# Patient Record
Sex: Male | Born: 1995 | Hispanic: No | Marital: Single | Smoking: Current every day smoker
Health system: Southern US, Community
[De-identification: ages and names within clinical notes are randomized; demographics above are authoritative.]

## PROBLEM LIST (undated history)

## (undated) DIAGNOSIS — R51 Headache: Secondary | ICD-10-CM

## (undated) DIAGNOSIS — F1410 Cocaine abuse, uncomplicated: Secondary | ICD-10-CM

## (undated) DIAGNOSIS — F1910 Other psychoactive substance abuse, uncomplicated: Secondary | ICD-10-CM

## (undated) DIAGNOSIS — K219 Gastro-esophageal reflux disease without esophagitis: Secondary | ICD-10-CM

## (undated) DIAGNOSIS — F410 Panic disorder [episodic paroxysmal anxiety] without agoraphobia: Secondary | ICD-10-CM

## (undated) DIAGNOSIS — J45909 Unspecified asthma, uncomplicated: Secondary | ICD-10-CM

## (undated) DIAGNOSIS — R519 Headache, unspecified: Secondary | ICD-10-CM

## (undated) DIAGNOSIS — H1010 Acute atopic conjunctivitis, unspecified eye: Secondary | ICD-10-CM

## (undated) DIAGNOSIS — Z9109 Other allergy status, other than to drugs and biological substances: Secondary | ICD-10-CM

## (undated) DIAGNOSIS — R569 Unspecified convulsions: Secondary | ICD-10-CM

## (undated) DIAGNOSIS — F32A Depression, unspecified: Secondary | ICD-10-CM

## (undated) DIAGNOSIS — F419 Anxiety disorder, unspecified: Secondary | ICD-10-CM

## (undated) HISTORY — DX: Unspecified convulsions: R56.9

## (undated) HISTORY — DX: Gastro-esophageal reflux disease without esophagitis: K21.9

## (undated) HISTORY — DX: Anxiety disorder, unspecified: F41.9

## (undated) HISTORY — DX: Cocaine abuse, uncomplicated: F14.10

## (undated) HISTORY — DX: Other psychoactive substance abuse, uncomplicated: F19.10

## (undated) HISTORY — DX: Panic disorder (episodic paroxysmal anxiety): F41.0

## (undated) HISTORY — PX: ESOPHAGOGASTRODUODENOSCOPY: SHX1529

## (undated) HISTORY — DX: Other allergy status, other than to drugs and biological substances: Z91.09

## (undated) HISTORY — DX: Headache: R51

## (undated) HISTORY — DX: Headache, unspecified: R51.9

## (undated) HISTORY — PX: OTHER SURGICAL HISTORY: SHX169

## (undated) HISTORY — DX: Acute atopic conjunctivitis, unspecified eye: H10.10

## (undated) HISTORY — PX: UPPER GI ENDOSCOPY: SHX6162

## (undated) HISTORY — PX: TONSILLECTOMY: SUR1361

## (undated) HISTORY — DX: Unspecified asthma, uncomplicated: J45.909

## (undated) HISTORY — DX: Depression, unspecified: F32.A

## (undated) HISTORY — PX: NM ESOPHAGEAL REFLUX: HXRAD613

---
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
## 2016-01-20 ENCOUNTER — Ambulatory Visit: Payer: Managed Care, Other (non HMO) | Admitting: Physician Assistant

## 2016-02-02 ENCOUNTER — Encounter: Payer: Self-pay | Admitting: Medical

## 2016-02-02 ENCOUNTER — Ambulatory Visit (INDEPENDENT_AMBULATORY_CARE_PROVIDER_SITE_OTHER): Payer: Managed Care, Other (non HMO) | Admitting: Medical

## 2016-02-02 VITALS — BP 118/78 | HR 96 | Temp 98.1°F | Ht 70.75 in | Wt 226.8 lb

## 2016-02-02 DIAGNOSIS — R062 Wheezing: Secondary | ICD-10-CM

## 2016-02-02 DIAGNOSIS — J309 Allergic rhinitis, unspecified: Secondary | ICD-10-CM

## 2016-02-02 DIAGNOSIS — J0100 Acute maxillary sinusitis, unspecified: Secondary | ICD-10-CM | POA: Diagnosis not present

## 2016-02-02 MED ORDER — LEVOCETIRIZINE DIHYDROCHLORIDE 5 MG PO TABS: 5.0000 mg | ORAL_TABLET | Freq: Every evening | ORAL | Status: DC

## 2016-02-02 MED ORDER — MONTELUKAST SODIUM 10 MG PO TABS: 10.0000 mg | ORAL_TABLET | Freq: Every day | ORAL | Status: DC

## 2016-02-02 MED ORDER — OMEPRAZOLE 40 MG PO CPDR: 40.0000 mg | DELAYED_RELEASE_CAPSULE | Freq: Every day | ORAL | Status: DC

## 2016-02-02 MED ORDER — ALBUTEROL SULFATE HFA 108 (90 BASE) MCG/ACT IN AERS: 2.0000 | INHALATION_SPRAY | Freq: Four times a day (QID) | RESPIRATORY_TRACT | Status: DC | PRN

## 2016-02-02 MED ORDER — FLUTICASONE PROPIONATE 50 MCG/ACT NA SUSP: 2.0000 | Freq: Every day | NASAL | Status: DC

## 2016-02-02 MED ORDER — AZITHROMYCIN 250 MG PO TABS: ORAL_TABLET | ORAL | Status: DC

## 2016-02-02 NOTE — Progress Notes (Signed)
Pre visit review using our clinic review tool, if applicable. No additional management support is needed unless otherwise documented below in the visit note. 

## 2016-02-02 NOTE — Progress Notes (Signed)
Subjective:    Patient ID: Andrew Castillo, male    DOB: 10-31-1995, 20 y.o.   MRN: [id number]  HPI  Pt in for evaluation of chest congestion, cough, nasal congestion and sinus pressure. Symptoms for about 1 wk. Sneezing and itchy eyes. Mom thinks he has year round allergies. Should be on flonase but does not use. States makes his nose bleed.(But minimizes severity after discussing)   Some sweating last night and maybe fever last night.  Pt mom states when he was younger had to used inhalers     Review of Systems  Constitutional: Positive for fever and diaphoresis. Negative for chills and fatigue.  HENT: Positive for congestion, postnasal drip, sinus pressure and sneezing. Negative for sore throat.   Respiratory: Positive for cough and wheezing. Negative for chest tightness.        Some wheezing at night  Past 2 nights.  Cardiovascular: Negative for chest pain and palpitations.  Gastrointestinal: Negative for abdominal pain.  Musculoskeletal: Negative for back pain.  Neurological: Negative for dizziness and headaches.  Hematological: Negative for adenopathy. Does not bruise/bleed easily.  Psychiatric/Behavioral: Negative for behavioral problems and confusion.    Past Medical History  Diagnosis Date  . GERD (gastroesophageal reflux disease)   . Seasonal allergic conjunctivitis   . Environmental allergies   . Childhood asthma      Social History   Social History  . Marital Status: Single    Spouse Name: N/A  . Number of Children: 0  . Years of Education: N/A   Occupational History  . Student     [employer]   Social History Main Topics  . Smoking status: Never Smoker   . Smokeless tobacco: Never Used  . Alcohol Use: No  . Drug Use: No  . Sexual Activity: Not on file   Other Topics Concern  . Not on file   Social History Narrative    Past Surgical History  Procedure Laterality Date  . Left knee menuiscus    . Right shoulder surgery    . Tonsillectomy       Family History  Problem Relation Age of Onset  . Hypertension Father   . Hyperlipidemia Father     Living  . Fibromyalgia Mother   . Thyroid disease Mother     Living  . Lupus Mother   . Anxiety disorder Mother   . Clotting disorder Mother   . Bone cancer Paternal Grandmother   . Heart disease Paternal Grandfather   . Diabetes Maternal Grandmother   . Diabetes Paternal Grandfather   . Hypertension Maternal Grandmother   . Liver disease Maternal Grandmother   . Anxiety disorder Maternal Aunt   . Depression Maternal Aunt   . Lupus Maternal Aunt   . Diabetes Paternal Aunt   . Lupus Paternal Aunt   . Fibromyalgia Paternal Aunt   . Drug abuse Paternal Uncle   . ADD / ADHD Brother   . Anxiety disorder Sister   . Migraines Other     Various  . Seizures Brother     No Known Allergies  Current Outpatient Prescriptions on File Prior to Visit  Medication Sig Dispense Refill  . escitalopram (LEXAPRO) 10 MG tablet Take 1 tablet (10 mg total) by mouth daily. 30 tablet 1   No current facility-administered medications on file prior to visit.    BP 118/78 mmHg  Pulse 96  Temp(Src) 98.1 F (36.7 C) (Oral)  Ht 5' 10.75" (1.797 m)  Wt 226 lb  12.8 oz (102.876 kg)  BMI 31.86 kg/m2  SpO2 98%       Objective:   Physical Exam  General  Mental Status - Alert. General Appearance - Well groomed. Not in acute distress.  Skin Rashes- No Rashes.  HEENT Head- Normal. Ear Auditory Canal - Left- Normal. Right - Normal.Tympanic Membrane- Left- Normal. Right- Normal. Eye Sclera/Conjunctiva- Left- Normal. Right- Normal. Nose & Sinuses Nasal Mucosa- Left-  Boggy and Congested. Right-  Boggy and Congested.Bilateral faint  maxillary but no  frontal sinus pressure. Mouth & Throat Lips: Upper Lip- Normal: no dryness, cracking, pallor, cyanosis, or vesicular eruption. Lower Lip-Normal: no dryness, cracking, pallor, cyanosis or vesicular eruption. Buccal Mucosa- Bilateral- No Aphthous  ulcers. Oropharynx- No Discharge or Erythema. +pnd. Tonsils: Characteristics- Bilateral- No Erythema or Congestion. Size/Enlargement- Bilateral- No enlargement. Discharge- bilateral-None.  Neck Neck- Supple. No Masses. No rigidiy.   Chest and Lung Exam Auscultation: Breath Sounds:-Clear even and unlabored.  Cardiovascular Auscultation:Rythm- Regular, rate and rhythm. Murmurs & Other Heart Sounds:Ausculatation of the heart reveal- No Murmurs.  Lymphatic Head & Neck General Head & Neck Lymphatics: Bilateral: Description- No Localized lymphadenopathy.  Abdomen Inspection:-Inspeection Normal. Palpation/Percussion:Note:No mass. Palpation and Percussion of the abdomen reveal- Non Tender, Non Distended + BS, no rebound or guarding.    Neurologic Cranial Nerve exam:- CN III-XII intact(No nystagmus), symmetric smile. Finger to Nose:- Normal/Intact Strength:- 5/5 equal and symmetric strength both upper and lower extremities.       Assessment & Plan:  For your allergies rx xyzal and flonase(reports  that he will take) and will add montelukast(due to chronic year round allergies.)  For sinusitis and bronchitis will rx azithromycin antibiotic.  For wheezing rx albuterol(advised if using up to every 6 hours/frequently notify us)  Follow up in 7 days or as needed  Dylann Gallier, Ramon Dredge, [employer]

## 2016-02-02 NOTE — Patient Instructions (Addendum)
For your allergies rx xyzal and flonase(reports that he will take) and will add montelukast (due to chronic year round allergies.)  For sinusitis and bronchitis will rx azithromycin antibiotic.  For wheezing rx albuterol(advised if using up to every 6 hours/frequently notify us)  Follow up in 7 days or as needed

## 2016-04-01 ENCOUNTER — Emergency Department (HOSPITAL_COMMUNITY)
Admission: EM | Admit: 2016-04-01 | Discharge: 2016-04-02 | Disposition: A | Discharge status: Home / Self Care | Payer: Managed Care, Other (non HMO) | Attending: Emergency Medicine | Admitting: Emergency Medicine

## 2016-04-01 ENCOUNTER — Emergency Department (HOSPITAL_COMMUNITY): Payer: Managed Care, Other (non HMO)

## 2016-04-01 ENCOUNTER — Encounter (HOSPITAL_COMMUNITY): Payer: Self-pay | Admitting: Emergency Medicine

## 2016-04-01 DIAGNOSIS — R569 Unspecified convulsions: Secondary | ICD-10-CM | POA: Diagnosis present

## 2016-04-01 DIAGNOSIS — Z79899 Other long term (current) drug therapy: Secondary | ICD-10-CM | POA: Diagnosis not present

## 2016-04-01 DIAGNOSIS — F17210 Nicotine dependence, cigarettes, uncomplicated: Secondary | ICD-10-CM | POA: Diagnosis not present

## 2016-04-01 LAB — URINE MICROSCOPIC-ADD ON

## 2016-04-01 LAB — URINALYSIS, ROUTINE W REFLEX MICROSCOPIC
Bilirubin Urine: NEGATIVE
GLUCOSE, UA: NEGATIVE mg/dL
Ketones, ur: 15 mg/dL — AB
Leukocytes, UA: NEGATIVE
Nitrite: NEGATIVE
PH: 5 (ref 5.0–8.0)
Protein, ur: 100 mg/dL — AB
SPECIFIC GRAVITY, URINE: 1.027 (ref 1.005–1.030)

## 2016-04-01 LAB — CBC
HEMATOCRIT: 44.2 % (ref 39.0–52.0)
HEMOGLOBIN: 14.9 g/dL (ref 13.0–17.0)
MCH: 28.9 pg (ref 26.0–34.0)
MCHC: 33.7 g/dL (ref 30.0–36.0)
MCV: 85.8 fL (ref 78.0–100.0)
PLATELETS: 247 10*3/uL (ref 150–400)
RBC: 5.15 MIL/uL (ref 4.22–5.81)
RDW: 13.2 % (ref 11.5–15.5)
WBC: 12.3 10*3/uL — ABNORMAL HIGH (ref 4.0–10.5)

## 2016-04-01 LAB — BASIC METABOLIC PANEL
Anion gap: 14 (ref 5–15)
BUN: 11 mg/dL (ref 6–20)
CHLORIDE: 107 mmol/L (ref 101–111)
CO2: 17 mmol/L — AB (ref 22–32)
CREATININE: 1.17 mg/dL (ref 0.61–1.24)
Calcium: 9.4 mg/dL (ref 8.9–10.3)
GFR calc non Af Amer: >60 mL/min (ref >60)
Glucose, Bld: 103 mg/dL — ABNORMAL HIGH (ref 65–99)
POTASSIUM: 4.1 mmol/L (ref 3.5–5.1)
Sodium: 138 mmol/L (ref 135–145)

## 2016-04-01 LAB — RAPID URINE DRUG SCREEN, HOSP PERFORMED
AMPHETAMINES: NOT DETECTED
BARBITURATES: NOT DETECTED
BENZODIAZEPINES: POSITIVE — AB
Cocaine: NOT DETECTED
Opiates: NOT DETECTED
TETRAHYDROCANNABINOL: POSITIVE — AB

## 2016-04-01 LAB — CBG MONITORING, ED: GLUCOSE-CAPILLARY: 98 mg/dL (ref 65–99)

## 2016-04-01 MED ORDER — ACETAMINOPHEN 500 MG PO TABS
1000.0000 mg | ORAL_TABLET | Freq: Once | ORAL | Status: AC
  Administered 2016-04-01: 1000 mg via ORAL
  Filled 2016-04-01: qty 2

## 2016-04-01 MED ORDER — DIPHENHYDRAMINE HCL 25 MG PO CAPS
25.0000 mg | ORAL_CAPSULE | Freq: Once | ORAL | Status: AC
  Administered 2016-04-01: 25 mg via ORAL
  Filled 2016-04-01: qty 1

## 2016-04-01 MED ORDER — SODIUM CHLORIDE 0.9 % IV BOLUS (SEPSIS)
1000.0000 mL | Freq: Once | INTRAVENOUS | Status: AC
  Administered 2016-04-01: 1000 mL via INTRAVENOUS

## 2016-04-01 MED ORDER — METOCLOPRAMIDE HCL 5 MG/ML IJ SOLN
10.0000 mg | Freq: Once | INTRAMUSCULAR | Status: AC
  Administered 2016-04-01: 10 mg via INTRAVENOUS
  Filled 2016-04-01: qty 2

## 2016-04-01 NOTE — ED Provider Notes (Signed)
CSN: [id number]     Arrival date & time 04/01/16  1915 History   First MD Initiated Contact with Patient 04/01/16 1927     Chief Complaint  Patient presents with  . Seizures     (Consider location/radiation/quality/duration/timing/severity/associated sxs/prior Treatment) Patient is a 20 y.o. male presenting with seizures. The history is provided by the patient and a parent.  Seizures Seizure activity on arrival: no   Seizure type:  Grand mal Preceding symptoms comment:  No preceding symptoms Initial focality:  Unable to specify Episode characteristics: abnormal movements, generalized shaking, stiffening and unresponsiveness   Episode characteristics: no incontinence and no tongue biting   Postictal symptoms: confusion and somnolence   Return to baseline: yes   Severity:  Moderate Duration:  45 seconds Timing:  Once Number of seizures this episode:  One Progression:  Resolved Context: decreased sleep, drug use, family hx of seizures and stress   Context: not alcohol withdrawal, not fever and not previous head injury   Recent head injury:  No recent head injuries PTA treatment:  None History of seizures: no     Past Medical History  Diagnosis Date  . GERD (gastroesophageal reflux disease)   . Seasonal allergic conjunctivitis   . Environmental allergies   . Childhood asthma    Past Surgical History  Procedure Laterality Date  . Left knee menuiscus    . Right shoulder surgery    . Tonsillectomy     Family History  Problem Relation Age of Onset  . Hypertension Father   . Hyperlipidemia Father     Living  . Fibromyalgia Mother   . Thyroid disease Mother     Living  . Lupus Mother   . Anxiety disorder Mother   . Clotting disorder Mother   . Bone cancer Paternal Grandmother   . Heart disease Paternal Grandfather   . Diabetes Maternal Grandmother   . Diabetes Paternal Grandfather   . Hypertension Maternal Grandmother   . Liver disease Maternal Grandmother   .  Anxiety disorder Maternal Aunt   . Depression Maternal Aunt   . Lupus Maternal Aunt   . Diabetes Paternal Aunt   . Lupus Paternal Aunt   . Fibromyalgia Paternal Aunt   . Drug abuse Paternal Uncle   . ADD / ADHD Brother   . Anxiety disorder Sister   . Migraines Other     Various  . Seizures Brother    Social History  Substance Use Topics  . Smoking status: Current Every Day Smoker -- 0.50 packs/day    Types: Cigarettes  . Smokeless tobacco: Never Used  . Alcohol Use: 0.0 oz/week    0 Standard drinks or equivalent per week     Comment: socially    Review of Systems  Constitutional: Negative for fever, chills, diaphoresis and fatigue.  HENT: Negative for congestion.   Eyes: Negative for visual disturbance.  Respiratory: Negative for cough, chest tightness and shortness of breath.   Cardiovascular: Positive for chest pain (when he takes a deep breath).  Gastrointestinal: Positive for nausea. Negative for vomiting, abdominal pain, diarrhea and constipation.  Genitourinary: Negative for dysuria and flank pain.  Musculoskeletal: Negative for neck pain.  Skin: Negative for rash.  Neurological: Positive for seizures and headaches (rare, intermittent). Negative for weakness and numbness.  Psychiatric/Behavioral: Negative for confusion and agitation.      Allergies  Review of patient's allergies indicates no known allergies.  Home Medications   Prior to Admission medications   Medication Sig Start  Date End Date Taking? Authorizing Provider  albuterol (PROVENTIL HFA;VENTOLIN HFA) 108 (90 Base) MCG/ACT inhaler Inhale 2 puffs into the lungs every 6 (six) hours as needed for wheezing or shortness of breath. 02/02/16  Yes Edward Saguier, PA-C  loratadine (CLARITIN) 10 MG tablet Take 10 mg by mouth daily.   Yes Historical Provider, MD  montelukast (SINGULAIR) 10 MG tablet Take 1 tablet (10 mg total) by mouth at bedtime. 02/02/16  Yes Edward Saguier, PA-C  omeprazole (PRILOSEC) 40 MG  capsule Take 1 capsule (40 mg total) by mouth daily. Reported on 12/21/2015 02/02/16  Yes Waldon Merl, PA-C  azithromycin (ZITHROMAX) 250 MG tablet Take 2 tablets by mouth on day 1, followed by 1 tablet by mouth daily for 4 days. Patient not taking: Reported on 04/01/2016 02/02/16   Esperanza Richters, PA-C  escitalopram (LEXAPRO) 10 MG tablet Take 1 tablet (10 mg total) by mouth daily. Patient not taking: Reported on 04/01/2016 12/21/15   Waldon Merl, PA-C  fluticasone [facility]) 50 MCG/ACT nasal spray Place 2 sprays into both nostrils daily. Patient not taking: Reported on 04/01/2016 02/02/16   Esperanza Richters, PA-C  levocetirizine (XYZAL) 5 MG tablet Take 1 tablet (5 mg total) by mouth every evening. Patient not taking: Reported on 04/01/2016 02/02/16   Ramon Dredge Saguier, PA-C   BP 105/52 mmHg  Pulse 71  Temp(Src) 98 F (36.7 C) (Oral)  Resp 19  Ht  (1.854 m)  Wt 102.059 kg  BMI 29.69 kg/m2  SpO2 98% Physical Exam  Constitutional: He is oriented to person, place, and time. He appears well-developed and well-nourished. No distress.  HENT:  Head: Normocephalic and atraumatic.  Eyes: Conjunctivae are normal.  Cardiovascular: Normal rate and normal heart sounds.   No murmur heard. Pulmonary/Chest: Effort normal and breath sounds normal. No respiratory distress. He has no wheezes.  Chest pain reproducible with palpation of the left chest.  Abdominal: Soft. There is no tenderness.  Musculoskeletal: He exhibits no edema.  Neurological: He is alert and oriented to person, place, and time. No cranial nerve deficit.  Skin: Skin is warm. He is not diaphoretic.  Psychiatric: He has a normal mood and affect. His behavior is normal.  Nursing note and vitals reviewed.   ED Course  Procedures (including critical care time) Labs Review Labs Reviewed  BASIC METABOLIC PANEL - Abnormal; Notable for the following:    CO2 17 (*)    Glucose, Bld 103 (*)    All other components within normal limits   CBC - Abnormal; Notable for the following:    WBC 12.3 (*)    All other components within normal limits  URINALYSIS, ROUTINE W REFLEX MICROSCOPIC (NOT AT [facility]) - Abnormal; Notable for the following:    APPearance CLOUDY (*)    Hgb urine dipstick MODERATE (*)    Ketones, ur 15 (*)    Protein, ur 100 (*)    All other components within normal limits  URINE RAPID DRUG SCREEN, HOSP PERFORMED - Abnormal; Notable for the following:    Benzodiazepines POSITIVE (*)    Tetrahydrocannabinol POSITIVE (*)    All other components within normal limits  URINE MICROSCOPIC-ADD ON - Abnormal; Notable for the following:    Squamous Epithelial / LPF 0-5 (*)    Bacteria, UA RARE (*)    All other components within normal limits  CBG MONITORING, ED  CBG MONITORING, ED    Imaging Review Dg Chest 2 View  04/01/2016  CLINICAL DATA:  Initial evaluation  for acute chest pain, shortness of breath. EXAM: CHEST  2 VIEW COMPARISON:  None. FINDINGS: The heart size and mediastinal contours are within normal limits. Both lungs are clear. The visualized skeletal structures are unremarkable. IMPRESSION: No active cardiopulmonary disease. Electronically Signed   By: Rise MuBenjamin  McClintock M.D.   On: 04/01/2016 23:35   Ct Head Wo Contrast  04/01/2016  CLINICAL DATA:  20 year old male with new onset seizure. EXAM: CT HEAD WITHOUT CONTRAST TECHNIQUE: Contiguous axial images were obtained from the base of the skull through the vertex without intravenous contrast. COMPARISON:  None. FINDINGS: No intracranial abnormalities are identified, including mass lesion or mass effect, hydrocephalus, extra-axial fluid collection, midline shift, hemorrhage, or acute infarction. Mild mucosal thickening and scattered paranasal sinuses noted. The visualized bony calvarium is unremarkable. IMPRESSION: No evidence of intracranial abnormality. Mild chronic paranasal sinus disease. Electronically Signed   By: Harmon PierJeffrey  Hu M.D.   On: 04/01/2016 22:41    I have personally reviewed and evaluated these images and lab results as part of my medical decision-making.   EKG Interpretation   Date/Time:  Sunday April 01 2016 19:18:30 EDT Ventricular Rate:  94 PR Interval:    QRS Duration: 95 QT Interval:  373 QTC Calculation: 467 R Axis:   42 Text Interpretation:  Sinus rhythm No previous tracing Confirmed by Denton LankSTEINL   MD, Caryn BeeKEVIN ([id number]) on 04/01/2016 11:44:35 PM      MDM   Final diagnoses:  Seizure (HCC)   Presented for first time seizure. The seizure lasted approximately 45 seconds and stopped without any specific intervention. He was noted to have generalized shaking, unresponsiveness, tongue biting, and a postictal state marked by sleepiness and confusion. Arrival he was fully alert and back to baseline. He had no particular inciting event but does endorse sleeping only 2 hours last night, spending all day in the sun, and recent drug use. His mom was recently diagnosed with seizure disorder of unknown etiology. He endorses recurrent headache and states he gets similar headaches infrequently.   Exam demonstrated a nonfocal neurologic exam and mild tenderness of the left chest. Laboratory evaluation was unremarkable but the patient was noted to have THC and benzos on UDS. Urinalysis demonstrates mild proteinuria with hemoglobinuria. Likely secondary to seizure episode.  EKG demonstrates normal sinus rhythm with ventricular rate of 94 bpm and no evidence of ischemia, abnormal intervals, or dysrhythmia.  CT of the head was unremarkable. Chest x-ray was unremarkable.  Patient was discharged home in good condition with strict instructions to follow up with neurology as soon as possible. No seizure prophylaxis started at this time due to  unknown etiology of seizure and recent complicating lack of sleep and drug use. Patient and family were updated on the results and the patient was instructed to return to the emergency department for any further  seizure activity.  Levora AngelEric Taylen Osorto, MD 04/02/16 0028  Cathren LaineKevin Steinl, MD 04/03/16 [phone]

## 2016-04-01 NOTE — ED Notes (Signed)
Per EMS- Pt had witnessed seizure like activity lasting approx 45 seconds. Upon EMS arrival- pt was diaphoretic, post ictal. Pt currently a/o x 4. Pt has no history of seizures. Mom has epilepsy.

## 2016-04-01 NOTE — Discharge Instructions (Signed)
You have had a seizure. The CAT scan of your head and the chest x-ray did not show any obvious cause. Your blood work was unremarkable. It's likely your recent lack of sleep and spending all day in the sun contributed. It is important that you see neurology as soon as possible for further evaluation. Please return immediately to the emergency department if you have another seizure.  Seizure, Adult A seizure is abnormal electrical activity in the brain. Seizures usually last from 30 seconds to 2 minutes. There are various types of seizures. Before a seizure, you may have a warning sensation (aura) that a seizure is about to occur. An aura may include the following symptoms:   Fear or anxiety.  Nausea.  Feeling like the room is spinning (vertigo).  Vision changes, such as seeing flashing lights or spots. Common symptoms during a seizure include:  A change in attention or behavior (altered mental status).  Convulsions with rhythmic jerking movements.  Drooling.  Rapid eye movements.  Grunting.  Loss of bladder and bowel control.  Bitter taste in the mouth.  Tongue biting. After a seizure, you may feel confused and sleepy. You may also have an injury resulting from convulsions during the seizure. HOME CARE INSTRUCTIONS   If you are given medicines, take them exactly as prescribed by your health care provider.  Keep all follow-up appointments as directed by your health care provider.  Do not swim or drive or engage in risky activity during which a seizure could cause further injury to you or others until your health care provider says it is OK.  Get adequate rest.  Teach friends and family what to do if you have a seizure. They should:  Lay you on the ground to prevent a fall.  Put a cushion under your head.  Loosen any tight clothing around your neck.  Turn you on your side. If vomiting occurs, this helps keep your airway clear.  Stay with you until you recover.  Know  whether or not you need emergency care. SEEK IMMEDIATE MEDICAL CARE IF:  The seizure lasts longer than 5 minutes.  The seizure is severe or you do not wake up immediately after the seizure.  You have an altered mental status after the seizure.  You are having more frequent or worsening seizures. Someone should drive you to the emergency department or call local emergency services (911 in [place] MAKE SURE YOU:  Understand these instructions.  Will watch your condition.  Will get help right away if you are not doing well or get worse.   This information is not intended to replace advice given to you by your health care provider. Make sure you discuss any questions you have with your health care provider.   Document Released: 08/31/2000 Document Revised: 09/24/2014 Document Reviewed: 04/15/2013 Elsevier Interactive Patient Education [employer]2016 [employer]

## 2016-04-05 ENCOUNTER — Encounter: Payer: Self-pay | Admitting: Internal Medicine

## 2016-04-18 ENCOUNTER — Encounter: Payer: Managed Care, Other (non HMO) | Admitting: Diagnostic Neuroimaging

## 2016-04-18 ENCOUNTER — Encounter: Payer: Self-pay | Admitting: Diagnostic Neuroimaging

## 2016-04-25 NOTE — Progress Notes (Signed)
This encounter was created in error - please disregard.

## 2016-04-26 ENCOUNTER — Telehealth: Payer: Self-pay | Admitting: Internal Medicine

## 2016-04-26 NOTE — Telephone Encounter (Signed)
The pt's mother was advised that the pt be seen in the ED if he is vomiting blood.  We have not seen the pt before, he has a New pt appt with Dr Leone PayorGessner on 06/15/16.  The pt's mother stated she had some carafate that was prescribed for her and wanted to know if he should try that.  I advised against that stating that he needs evaluation to get a correct diagnosis and he should not self treat.  She agreed and verbalized understanding of the recommendations and will call back as needed.

## 2016-04-26 NOTE — Telephone Encounter (Signed)
Spoke with the patient. He is on Omeprazole 40 mg. He admits he may forget to take it some days. Yesterday he did forget. He had reflux and vomited twice. He saw flecks of blood in what he vomited up. Appointment made with an APP for earlier evaluation. He will continue as his PCP had advised but make an effort to remember to take the Omeprazole every day before his first meal. OTC Maalox, Mylanta or Gaviscon as directed PRN. Earlier appointment scheduled.

## 2016-04-30 ENCOUNTER — Telehealth: Payer: Self-pay | Admitting: Physician Assistant

## 2016-04-30 ENCOUNTER — Encounter: Payer: Self-pay | Admitting: Physician Assistant

## 2016-04-30 ENCOUNTER — Ambulatory Visit (INDEPENDENT_AMBULATORY_CARE_PROVIDER_SITE_OTHER): Payer: Managed Care, Other (non HMO) | Admitting: Physician Assistant

## 2016-04-30 VITALS — BP 112/78 | HR 72 | Temp 98.0°F | Resp 16 | Ht 72.0 in | Wt 222.4 lb

## 2016-04-30 DIAGNOSIS — Z0000 Encounter for general adult medical examination without abnormal findings: Secondary | ICD-10-CM | POA: Insufficient documentation

## 2016-04-30 DIAGNOSIS — R569 Unspecified convulsions: Secondary | ICD-10-CM | POA: Diagnosis not present

## 2016-04-30 NOTE — Patient Instructions (Signed)
Please schedule an appointment for fasting labs.  I will call with results. Drop the forms for your school by so I can complete for you.  You will be contacted for Neurology appointment. Follow-up with your Gastroenterologist as scheduled.   Preventive Care for Adults, Male A healthy lifestyle and preventive care can promote health and wellness. Preventive health guidelines for men include the following key practices:  A routine yearly physical is a good way to check with your health care provider about your health and preventative screening. It is a chance to share any concerns and updates on your health and to receive a thorough exam.  Visit your dentist for a routine exam and preventative care every 6 months. Brush your teeth twice a day and floss once a day. Good oral hygiene prevents tooth decay and gum disease.  The frequency of eye exams is based on your age, health, family medical history, use of contact lenses, and other factors. Follow your health care provider's recommendations for frequency of eye exams.  Eat a healthy diet. Foods such as vegetables, fruits, whole grains, low-fat dairy products, and lean protein foods contain the nutrients you need without too many calories. Decrease your intake of foods high in solid fats, added sugars, and salt. Eat the right amount of calories for you.Get information about a proper diet from your health care provider, if necessary.  Regular physical exercise is one of the most important things you can do for your health. Most adults should get at least 150 minutes of moderate-intensity exercise (any activity that increases your heart rate and causes you to sweat) each week. In addition, most adults need muscle-strengthening exercises on 2 or more days a week.  Maintain a healthy weight. The body mass index (BMI) is a screening tool to identify possible weight problems. It provides an estimate of body fat based on height and weight. Your health  care provider can find your BMI and can help you achieve or maintain a healthy weight.For adults 20 years and older:  A BMI below 18.5 is considered underweight.  A BMI of 18.5 to 24.9 is normal.  A BMI of 25 to 29.9 is considered overweight.  A BMI of 30 and above is considered obese.  Maintain normal blood lipids and cholesterol levels by exercising and minimizing your intake of saturated fat. Eat a balanced diet with plenty of fruit and vegetables. Blood tests for lipids and cholesterol should begin at age 86 and be repeated every 5 years. If your lipid or cholesterol levels are high, you are over 50, or you are at high risk for heart disease, you may need your cholesterol levels checked more frequently.Ongoing high lipid and cholesterol levels should be treated with medicines if diet and exercise are not working.  If you smoke, find out from your health care provider how to quit. If you do not use tobacco, do not start.  Lung cancer screening is recommended for adults aged 67-80 years who are at high risk for developing lung cancer because of a history of smoking. A yearly low-dose CT scan of the lungs is recommended for people who have at least a 30-pack-year history of smoking and are a current smoker or have quit within the past 15 years. A pack year of smoking is smoking an average of 1 pack of cigarettes a day for 1 year (for example: 1 pack a day for 30 years or 2 packs a day for 15 years). Yearly screening should continue until  the smoker has stopped smoking for at least 15 years. Yearly screening should be stopped for people who develop a health problem that would prevent them from having lung cancer treatment.  If you choose to drink alcohol, do not have more than 2 drinks per day. One drink is considered to be 12 ounces (355 mL) of beer, 5 ounces (148 mL) of wine, or 1.5 ounces (44 mL) of liquor.  Avoid use of street drugs. Do not share needles with anyone. Ask for help if you need  support or instructions about stopping the use of drugs.  High blood pressure causes heart disease and increases the risk of stroke. Your blood pressure should be checked at least every 1-2 years. Ongoing high blood pressure should be treated with medicines, if weight loss and exercise are not effective.  If you are 70-97 years old, ask your health care provider if you should take aspirin to prevent heart disease.  Diabetes screening is done by taking a blood sample to check your blood glucose level after you have not eaten for a certain period of time (fasting). If you are not overweight and you do not have risk factors for diabetes, you should be screened once every 3 years starting at age 54. If you are overweight or obese and you are 4-63 years of age, you should be screened for diabetes every year as part of your cardiovascular risk assessment.  Colorectal cancer can be detected and often prevented. Most routine colorectal cancer screening begins at the age of 42 and continues through age 33. However, your health care provider may recommend screening at an earlier age if you have risk factors for colon cancer. On a yearly basis, your health care provider may provide home test kits to check for hidden blood in the stool. Use of a small camera at the end of a tube to directly examine the colon (sigmoidoscopy or colonoscopy) can detect the earliest forms of colorectal cancer. Talk to your health care provider about this at age 59, when routine screening begins. Direct exam of the colon should be repeated every 5-10 years through age 31, unless early forms of precancerous polyps or small growths are found.  People who are at an increased risk for hepatitis B should be screened for this virus. You are considered at high risk for hepatitis B if:  You were born in a country where hepatitis B occurs often. Talk with your health care provider about which countries are considered high risk.  Your parents  were born in a high-risk country and you have not received a shot to protect against hepatitis B (hepatitis B vaccine).  You have HIV or AIDS.  You use needles to inject street drugs.  You live with, or have sex with, someone who has hepatitis B.  You are a man who has sex with other men (MSM).  You get hemodialysis treatment.  You take certain medicines for conditions such as cancer, organ transplantation, and autoimmune conditions.  Hepatitis C blood testing is recommended for all people born from [birth year] through 1965 and any individual with known risks for hepatitis C.  Practice safe sex. Use condoms and avoid high-risk sexual practices to reduce the spread of sexually transmitted infections (STIs). STIs include gonorrhea, chlamydia, syphilis, trichomonas, herpes, HPV, and human immunodeficiency virus (HIV). Herpes, HIV, and HPV are viral illnesses that have no cure. They can result in disability, cancer, and death.  If you are a man who has sex with other  men, you should be screened at least once per year for:  HIV.  Urethral, rectal, and pharyngeal infection of gonorrhea, chlamydia, or both.  If you are at risk of being infected with HIV, it is recommended that you take a prescription medicine daily to prevent HIV infection. This is called preexposure prophylaxis (PrEP). You are considered at risk if:  You are a man who has sex with other men (MSM) and have other risk factors.  You are a heterosexual man, are sexually active, and are at increased risk for HIV infection.  You take drugs by injection.  You are sexually active with a partner who has HIV.  Talk with your health care provider about whether you are at high risk of being infected with HIV. If you choose to begin PrEP, you should first be tested for HIV. You should then be tested every 3 months for as long as you are taking PrEP.  A one-time screening for abdominal aortic aneurysm (AAA) and surgical repair of large AAAs  by ultrasound are recommended for men ages 21 to 43 years who are current or former smokers.  Healthy men should no longer receive prostate-specific antigen (PSA) blood tests as part of routine cancer screening. Talk with your health care provider about prostate cancer screening.  Testicular cancer screening is not recommended for adult males who have no symptoms. Screening includes self-exam, a health care provider exam, and other screening tests. Consult with your health care provider about any symptoms you have or any concerns you have about testicular cancer.  Use sunscreen. Apply sunscreen liberally and repeatedly throughout the day. You should seek shade when your shadow is shorter than you. Protect yourself by wearing long sleeves, pants, a wide-brimmed hat, and sunglasses year round, whenever you are outdoors.  Once a month, do a whole-body skin exam, using a mirror to look at the skin on your back. Tell your health care provider about new moles, moles that have irregular borders, moles that are larger than a pencil eraser, or moles that have changed in shape or color.  Stay current with required vaccines (immunizations).  Influenza vaccine. All adults should be immunized every year.  Tetanus, diphtheria, and acellular pertussis (Td, Tdap) vaccine. An adult who has not previously received Tdap or who does not know his vaccine status should receive 1 dose of Tdap. This initial dose should be followed by tetanus and diphtheria toxoids (Td) booster doses every 10 years. Adults with an unknown or incomplete history of completing a 3-dose immunization series with Td-containing vaccines should begin or complete a primary immunization series including a Tdap dose. Adults should receive a Td booster every 10 years.  Varicella vaccine. An adult without evidence of immunity to varicella should receive 2 doses or a second dose if he has previously received 1 dose.  Human papillomavirus (HPV) vaccine.  Males aged 11-21 years who have not received the vaccine previously should receive the 3-dose series. Males aged 22-26 years may be immunized. Immunization is recommended through the age of 105 years for any male who has sex with males and did not get any or all doses earlier. Immunization is recommended for any person with an immunocompromised condition through the age of 6 years if he did not get any or all doses earlier. During the 3-dose series, the second dose should be obtained 4-8 weeks after the first dose. The third dose should be obtained 24 weeks after the first dose and 16 weeks after the second dose.  Zoster vaccine. One dose is recommended for adults aged 39 years or older unless certain conditions are present.  Measles, mumps, and rubella (MMR) vaccine. Adults born before [birth year] generally are considered immune to measles and mumps. Adults born in [birth year] or later should have 1 or more doses of MMR vaccine unless there is a contraindication to the vaccine or there is laboratory evidence of immunity to each of the three diseases. A routine second dose of MMR vaccine should be obtained at least 28 days after the first dose for students attending postsecondary schools, health care workers, or international travelers. People who received inactivated measles vaccine or an unknown type of measles vaccine during 1963-1967 should receive 2 doses of MMR vaccine. People who received inactivated mumps vaccine or an unknown type of mumps vaccine before 1979 and are at high risk for mumps infection should consider immunization with 2 doses of MMR vaccine. Unvaccinated health care workers born before [birth year] who lack laboratory evidence of measles, mumps, or rubella immunity or laboratory confirmation of disease should consider measles and mumps immunization with 2 doses of MMR vaccine or rubella immunization with 1 dose of MMR vaccine.  Pneumococcal 13-valent conjugate (PCV13) vaccine. When indicated, a person who is  uncertain of his immunization history and has no record of immunization should receive the PCV13 vaccine. All adults 63 years of age and older should receive this vaccine. An adult aged 66 years or older who has certain medical conditions and has not been previously immunized should receive 1 dose of PCV13 vaccine. This PCV13 should be followed with a dose of pneumococcal polysaccharide (PPSV23) vaccine. Adults who are at high risk for pneumococcal disease should obtain the PPSV23 vaccine at least 8 weeks after the dose of PCV13 vaccine. Adults older than 19 years of age who have normal immune system function should obtain the PPSV23 vaccine dose at least 1 year after the dose of PCV13 vaccine.  Pneumococcal polysaccharide (PPSV23) vaccine. When PCV13 is also indicated, PCV13 should be obtained first. All adults aged 86 years and older should be immunized. An adult younger than age 41 years who has certain medical conditions should be immunized. Any person who resides in a nursing home or long-term care facility should be immunized. An adult smoker should be immunized. People with an immunocompromised condition and certain other conditions should receive both PCV13 and PPSV23 vaccines. People with human immunodeficiency virus (HIV) infection should be immunized as soon as possible after diagnosis. Immunization during chemotherapy or radiation therapy should be avoided. Routine use of PPSV23 vaccine is not recommended for American Indians, [place] Natives, or people younger than 65 years unless there are medical conditions that require PPSV23 vaccine. When indicated, people who have unknown immunization and have no record of immunization should receive PPSV23 vaccine. One-time revaccination 5 years after the first dose of PPSV23 is recommended for people aged 19-64 years who have chronic kidney failure, nephrotic syndrome, asplenia, or immunocompromised conditions. People who received 1-2 doses of PPSV23 before age  27 years should receive another dose of PPSV23 vaccine at age 11 years or later if at least 5 years have passed since the previous dose. Doses of PPSV23 are not needed for people immunized with PPSV23 at or after age 84 years.  Meningococcal vaccine. Adults with asplenia or persistent complement component deficiencies should receive 2 doses of quadrivalent meningococcal conjugate (MenACWY-D) vaccine. The doses should be obtained at least 2 months apart. Microbiologists working with certain meningococcal bacteria, [employer] recruits, people at  risk during an outbreak, and people who travel to or live in countries with a high rate of meningitis should be immunized. A first-year college student up through age 61 years who is living in a residence hall should receive a dose if he did not receive a dose on or after his 16th birthday. Adults who have certain high-risk conditions should receive one or more doses of vaccine.  Hepatitis A vaccine. Adults who wish to be protected from this disease, have chronic liver disease, work with hepatitis A-infected animals, work in hepatitis A research labs, or travel to or work in countries with a high rate of hepatitis A should be immunized. Adults who were previously unvaccinated and who anticipate close contact with an international adoptee during the first 60 days after arrival in the [place] from a country with a high rate of hepatitis A should be immunized.  Hepatitis B vaccine. Adults should be immunized if they wish to be protected from this disease, are under age 33 years and have diabetes, have chronic liver disease, have had more than one sex partner in the past 6 months, may be exposed to blood or other infectious body fluids, are household contacts or sex partners of hepatitis B positive people, are clients or workers in certain care facilities, or travel to or work in countries with a high rate of hepatitis B.  Haemophilus influenzae type b (Hib) vaccine. A  previously unvaccinated person with asplenia or sickle cell disease or having a scheduled splenectomy should receive 1 dose of Hib vaccine. Regardless of previous immunization, a recipient of a hematopoietic stem cell transplant should receive a 3-dose series 6-12 months after his successful transplant. Hib vaccine is not recommended for adults with HIV infection. Preventive Service / Frequency Ages 23 to 71  Blood pressure check.** / Every 3-5 years.  Lipid and cholesterol check.** / Every 5 years beginning at age 56.  Hepatitis C blood test.** / For any individual with known risks for hepatitis C.  Skin self-exam. / Monthly.  Influenza vaccine. / Every year.  Tetanus, diphtheria, and acellular pertussis (Tdap, Td) vaccine.** / Consult your health care provider. 1 dose of Td every 10 years.  Varicella vaccine.** / Consult your health care provider.  HPV vaccine. / 3 doses over 6 months, if 8 or younger.  Measles, mumps, rubella (MMR) vaccine.** / You need at least 1 dose of MMR if you were born in 1957 or later. You may also need a second dose.  Pneumococcal 13-valent conjugate (PCV13) vaccine.** / Consult your health care provider.  Pneumococcal polysaccharide (PPSV23) vaccine.** / 1 to 2 doses if you smoke cigarettes or if you have certain conditions.  Meningococcal vaccine.** / 1 dose if you are age 10 to 9 years and a Market researcher living in a residence hall, or have one of several medical conditions. You may also need additional booster doses.  Hepatitis A vaccine.** / Consult your health care provider.  Hepatitis B vaccine.** / Consult your health care provider.  Haemophilus influenzae type b (Hib) vaccine.** / Consult your health care provider. Ages 25 to 71  Blood pressure check.** / Every year.  Lipid and cholesterol check.** / Every 5 years beginning at age 19.  Lung cancer screening. / Every year if you are aged 59-80 years and have a 30-pack-year  history of smoking and currently smoke or have quit within the past 15 years. Yearly screening is stopped once you have quit smoking for at least 15  years or develop a health problem that would prevent you from having lung cancer treatment.  Fecal occult blood test (FOBT) of stool. / Every year beginning at age 67 and continuing until age 58. You may not have to do this test if you get a colonoscopy every 10 years.  Flexible sigmoidoscopy** or colonoscopy.** / Every 5 years for a flexible sigmoidoscopy or every 10 years for a colonoscopy beginning at age 55 and continuing until age 69.  Hepatitis C blood test.** / For all people born from [birth year] through 1965 and any individual with known risks for hepatitis C.  Skin self-exam. / Monthly.  Influenza vaccine. / Every year.  Tetanus, diphtheria, and acellular pertussis (Tdap/Td) vaccine.** / Consult your health care provider. 1 dose of Td every 10 years.  Varicella vaccine.** / Consult your health care provider.  Zoster vaccine.** / 1 dose for adults aged 74 years or older.  Measles, mumps, rubella (MMR) vaccine.** / You need at least 1 dose of MMR if you were born in 1956 or later. You may also need a second dose.  Pneumococcal 13-valent conjugate (PCV13) vaccine.** / Consult your health care provider.  Pneumococcal polysaccharide (PPSV23) vaccine.** / 1 to 2 doses if you smoke cigarettes or if you have certain conditions.  Meningococcal vaccine.** / Consult your health care provider.  Hepatitis A vaccine.** / Consult your health care provider.  Hepatitis B vaccine.** / Consult your health care provider.  Haemophilus influenzae type b (Hib) vaccine.** / Consult your health care provider. Ages 3 and over  Blood pressure check.** / Every year.  Lipid and cholesterol check.**/ Every 5 years beginning at age 15.  Lung cancer screening. / Every year if you are aged 70-80 years and have a 30-pack-year history of smoking and currently  smoke or have quit within the past 15 years. Yearly screening is stopped once you have quit smoking for at least 15 years or develop a health problem that would prevent you from having lung cancer treatment.  Fecal occult blood test (FOBT) of stool. / Every year beginning at age 49 and continuing until age 65. You may not have to do this test if you get a colonoscopy every 10 years.  Flexible sigmoidoscopy** or colonoscopy.** / Every 5 years for a flexible sigmoidoscopy or every 10 years for a colonoscopy beginning at age 16 and continuing until age 75.  Hepatitis C blood test.** / For all people born from [birth year] through 1964 and any individual with known risks for hepatitis C.  Abdominal aortic aneurysm (AAA) screening.** / A one-time screening for ages 9 to 74 years who are current or former smokers.  Skin self-exam. / Monthly.  Influenza vaccine. / Every year.  Tetanus, diphtheria, and acellular pertussis (Tdap/Td) vaccine.** / 1 dose of Td every 10 years.  Varicella vaccine.** / Consult your health care provider.  Zoster vaccine.** / 1 dose for adults aged 9 years or older.  Pneumococcal 13-valent conjugate (PCV13) vaccine.** / 1 dose for all adults aged 96 years and older.  Pneumococcal polysaccharide (PPSV23) vaccine.** / 1 dose for all adults aged 49 years and older.  Meningococcal vaccine.** / Consult your health care provider.  Hepatitis A vaccine.** / Consult your health care provider.  Hepatitis B vaccine.** / Consult your health care provider.  Haemophilus influenzae type b (Hib) vaccine.** / Consult your health care provider. **Family history and personal history of risk and conditions may change your health care provider's recommendations.   This information is not  intended to replace advice given to you by your health care provider. Make sure you discuss any questions you have with your health care provider.   Document Released: 10/30/2001 Document Revised:  09/24/2014 Document Reviewed: 01/29/2011 Elsevier Interactive Patient Education [employer].

## 2016-04-30 NOTE — Assessment & Plan Note (Signed)
Depression screen negative. Health Maintenance reviewed -- HIV screen obtained. Preventive schedule discussed and handout given in AVS. Patient is not-fasting. Will return fasting for blood work. Lab orders placed.

## 2016-04-30 NOTE — Progress Notes (Signed)
Patient presents to clinic today for annual exam.  Patient is fasting for labs.  Acute Concerns: Denies acute concerns today. Has a history of a seizure-like episode occurring a month ago after an episode of sleep deprivation and dehydration. Was scheduled to see Neurology but left before being seen as he states he had waited for 2 hours already and had to be somewhere by a certain time. Thankfully denies any recurrent episodes. Would like to be referred to another specialist.   Chronic Issues: Anxiety and depression -- currently on Lexapro 10 mg daily. Is taking as directed. Denies breakthrough symptoms on regimen.  Health Maintenance: Immunizations -- up-to-date HIV screen -- agrees to have today. Denies concerns.  Past Medical History:  Diagnosis Date  . Childhood asthma   . Environmental allergies   . GERD (gastroesophageal reflux disease)   . Headache    migraines  . Seasonal allergic conjunctivitis     Past Surgical History:  Procedure Laterality Date  . Left Knee Menuiscus     "several surguries on same knee"  . NM ESOPHAGEAL REFLUX    . Right Shoulder Surgery    . TONSILLECTOMY     adenoids    Current Outpatient Prescriptions on File Prior to Visit  Medication Sig Dispense Refill  . albuterol (PROVENTIL HFA;VENTOLIN HFA) 108 (90 Base) MCG/ACT inhaler Inhale 2 puffs into the lungs every 6 (six) hours as needed for wheezing or shortness of breath. 1 Inhaler 0  . escitalopram (LEXAPRO) 10 MG tablet Take 1 tablet (10 mg total) by mouth daily. 30 tablet 1  . fluticasone (FLONASE) 50 MCG/ACT nasal spray Place 2 sprays into both nostrils daily. 16 g 3  . loratadine (CLARITIN) 10 MG tablet Take 10 mg by mouth daily.    . montelukast (SINGULAIR) 10 MG tablet Take 1 tablet (10 mg total) by mouth at bedtime. 30 tablet 3  . omeprazole (PRILOSEC) 40 MG capsule Take 1 capsule (40 mg total) by mouth daily. Reported on 12/21/2015 30 capsule 4   No current facility-administered  medications on file prior to visit.     No Known Allergies  Family History  Problem Relation Age of Onset  . Hypertension Father   . Hyperlipidemia Father     Living  . Fibromyalgia Mother   . Thyroid disease Mother     Living  . Lupus Mother   . Anxiety disorder Mother   . Clotting disorder Mother   . Bone cancer Paternal Grandmother   . Heart disease Paternal Grandfather   . Diabetes Paternal Grandfather   . Diabetes Maternal Grandmother   . Hypertension Maternal Grandmother   . Liver disease Maternal Grandmother   . Anxiety disorder Maternal Aunt   . Depression Maternal Aunt   . Lupus Maternal Aunt   . Diabetes Paternal Aunt   . Lupus Paternal Aunt   . Fibromyalgia Paternal Aunt   . Drug abuse Paternal Uncle   . ADD / ADHD Brother   . Anxiety disorder Sister   . Migraines Other     Various  . Seizures Brother     Social History   Social History  . Marital status: Single    Spouse name: N/A  . Number of children: 0  . Years of education: 12   Occupational History  . Student     [employer]  .      works at [employer]  . Smoking status: Former Smoker  Packs/day: 0.50    Types: Cigarettes    Quit date: 04/17/2016  . Smokeless tobacco: Never Used     Comment: 04/18/16 1 pack a week  . Alcohol use 0.0 oz/week     Comment: socially  . Drug use:     Types: Marijuana     Comment: daily  . Sexual activity: Not on file   Other Topics Concern  . Not on file   Social History Narrative   Lives with aunt   No caffeine    Review of Systems  Constitutional: Negative for fever and weight loss.  HENT: Negative for ear discharge, ear pain, hearing loss and tinnitus.   Eyes: Negative for blurred vision, double vision, photophobia and pain.  Respiratory: Negative for cough and shortness of breath.   Cardiovascular: Negative for chest pain and palpitations.  Gastrointestinal: Negative for abdominal pain, blood in stool, constipation,  diarrhea, heartburn, melena, nausea and vomiting.  Genitourinary: Negative for dysuria, flank pain, frequency, hematuria and urgency.  Musculoskeletal: Negative for falls.  Neurological: Negative for dizziness, loss of consciousness and headaches.  Endo/Heme/Allergies: Negative for environmental allergies.  Psychiatric/Behavioral: Negative for depression, hallucinations, substance abuse and suicidal ideas. The patient is not nervous/anxious and does not have insomnia.     BP 112/78 (Patient Position: Sitting, Cuff Size: Normal)   Pulse 72   Temp 98 F (36.7 C) (Oral)   Resp 16   Ht 6' (1.829 m)   Wt 222 lb 6 oz (100.9 kg)   SpO2 98%   BMI 30.16 kg/m   Physical Exam  Constitutional: He is oriented to person, place, and time and well-developed, well-nourished, and in no distress.  HENT:  Head: Normocephalic and atraumatic.  Right Ear: External ear normal.  Left Ear: External ear normal.  Nose: Nose normal.  Mouth/Throat: Oropharynx is clear and moist. No oropharyngeal exudate.  Eyes: Conjunctivae and EOM are normal. Pupils are equal, round, and reactive to light.  Neck: Neck supple. No thyromegaly present.  Cardiovascular: Normal rate, regular rhythm, normal heart sounds and intact distal pulses.   Pulmonary/Chest: Effort normal and breath sounds normal. No respiratory distress. He has no wheezes. He has no rales. He exhibits no tenderness.  Abdominal: Soft. Bowel sounds are normal. He exhibits no distension and no mass. There is no tenderness. There is no rebound and no guarding.  Genitourinary: Testes/scrotum normal.  Lymphadenopathy:    He has no cervical adenopathy.  Neurological: He is alert and oriented to person, place, and time.  Skin: Skin is warm and dry. No rash noted.  Psychiatric: Affect normal.  Vitals reviewed.   Recent Results (from the past 2160 hour(s))  Basic metabolic panel - if new onset seizures     Status: Abnormal   Collection Time: 04/01/16  7:15 PM    Result Value Ref Range   Sodium 138 135 - 145 mmol/L   Potassium 4.1 3.5 - 5.1 mmol/L   Chloride 107 101 - 111 mmol/L   CO2 17 (L) 22 - 32 mmol/L   Glucose, Bld 103 (H) 65 - 99 mg/dL   BUN 11 6 - 20 mg/dL   Creatinine, Ser 1.17 0.61 - 1.24 mg/dL   Calcium 9.4 8.9 - 10.3 mg/dL   GFR calc non Af Amer >60 >60 mL/min   GFR calc Af Amer >60 >60 mL/min    Comment: (NOTE) The eGFR has been calculated using the CKD EPI equation. This calculation has not been validated in all clinical situations. eGFR's persistently <  60 mL/min signify possible Chronic Kidney Disease.    Anion gap 14 5 - 15  CBC - if new onset seizures     Status: Abnormal   Collection Time: 04/01/16  7:15 PM  Result Value Ref Range   WBC 12.3 (H) 4.0 - 10.5 K/uL   RBC 5.15 4.22 - 5.81 MIL/uL   Hemoglobin 14.9 13.0 - 17.0 g/dL   HCT 44.2 39.0 - 52.0 %   MCV 85.8 78.0 - 100.0 fL   MCH 28.9 26.0 - 34.0 pg   MCHC 33.7 30.0 - 36.0 g/dL   RDW 13.2 11.5 - 15.5 %   Platelets 247 150 - 400 K/uL  CBG monitoring, ED     Status: None   Collection Time: 04/01/16  7:22 PM  Result Value Ref Range   Glucose-Capillary 98 65 - 99 mg/dL  Urinalysis, Routine w reflex microscopic (not at [facility])     Status: Abnormal   Collection Time: 04/01/16 10:28 PM  Result Value Ref Range   Color, Urine YELLOW YELLOW   APPearance CLOUDY (A) CLEAR   Specific Gravity, Urine 1.027 1.005 - 1.030   pH 5.0 5.0 - 8.0   Glucose, UA NEGATIVE NEGATIVE mg/dL   Hgb urine dipstick MODERATE (A) NEGATIVE   Bilirubin Urine NEGATIVE NEGATIVE   Ketones, ur 15 (A) NEGATIVE mg/dL   Protein, ur 100 (A) NEGATIVE mg/dL   Nitrite NEGATIVE NEGATIVE   Leukocytes, UA NEGATIVE NEGATIVE  Rapid urine drug screen (hospital performed)     Status: Abnormal   Collection Time: 04/01/16 10:28 PM  Result Value Ref Range   Opiates NONE DETECTED NONE DETECTED   Cocaine NONE DETECTED NONE DETECTED   Benzodiazepines POSITIVE (A) NONE DETECTED   Amphetamines NONE DETECTED NONE  DETECTED   Tetrahydrocannabinol POSITIVE (A) NONE DETECTED   Barbiturates NONE DETECTED NONE DETECTED    Comment:        DRUG SCREEN FOR MEDICAL PURPOSES ONLY.  IF CONFIRMATION IS NEEDED FOR ANY PURPOSE, NOTIFY LAB WITHIN 5 DAYS.        LOWEST DETECTABLE LIMITS FOR URINE DRUG SCREEN Drug Class       Cutoff (ng/mL) Amphetamine      1000 Barbiturate      200 Benzodiazepine   837 Tricyclics       290 Opiates          300 Cocaine          300 THC              50   Urine microscopic-add on     Status: Abnormal   Collection Time: 04/01/16 10:28 PM  Result Value Ref Range   Squamous Epithelial / LPF 0-5 (A) NONE SEEN   WBC, UA 0-5 0 - 5 WBC/hpf   RBC / HPF 6-30 0 - 5 RBC/hpf   Bacteria, UA RARE (A) NONE SEEN   Urine-Other MUCOUS PRESENT     Assessment/Plan: Seizure-like activity (HCC) 1 episode over a month prior. No residual symptoms. Neuro exam within normal limits. Will refer to [facility] for further assessment.  Visit for preventive health examination Depression screen negative. Health Maintenance reviewed -- HIV screen obtained. Preventive schedule discussed and handout given in AVS. Patient is not-fasting. Will return fasting for blood work. Lab orders placed.     Leeanne Rio, PA-C

## 2016-04-30 NOTE — Assessment & Plan Note (Signed)
1 episode over a month prior. No residual symptoms. Neuro exam within normal limits. Will refer to [facility] for further assessment.

## 2016-04-30 NOTE — Telephone Encounter (Signed)
Pt dropped off a physical examination form for Cody to fill out, documents was placed in tray at front office

## 2016-05-02 ENCOUNTER — Ambulatory Visit (INDEPENDENT_AMBULATORY_CARE_PROVIDER_SITE_OTHER): Payer: Managed Care, Other (non HMO) | Admitting: Gastroenterology

## 2016-05-02 ENCOUNTER — Encounter: Payer: Self-pay | Admitting: Gastroenterology

## 2016-05-02 VITALS — BP 132/82 | HR 78 | Ht 72.0 in

## 2016-05-02 DIAGNOSIS — K219 Gastro-esophageal reflux disease without esophagitis: Secondary | ICD-10-CM

## 2016-05-02 DIAGNOSIS — G43A0 Cyclical vomiting, not intractable: Secondary | ICD-10-CM

## 2016-05-02 DIAGNOSIS — R112 Nausea with vomiting, unspecified: Secondary | ICD-10-CM | POA: Insufficient documentation

## 2016-05-02 DIAGNOSIS — K920 Hematemesis: Secondary | ICD-10-CM

## 2016-05-02 NOTE — Progress Notes (Signed)
05/02/2016 Andrew Castillo [id number] 1996/04/12   HISTORY OF PRESENT ILLNESS:  This is a 20 year old male who is new to our practice. His mother is a patient of Dr. Marvell FullerGessner's.  He is alone at our office today with complaints of vomiting. He tells me that he was having almost daily vomiting for the past year. He says that he does have reflux sensation and acid coming up  into his chest.  He was started on omeprazole 40 mg daily in March and says that that has helped to some degree. Last week he had one episode where he hadn't taken his medicine yet and he vomited and saw some drops of blood with the emesis.  He admits that he had been smoking marijuana daily for quite some time, but has not smoked for over a week due to starting baseball training.  Past Medical History:  Diagnosis Date  . Childhood asthma   . Environmental allergies   . GERD (gastroesophageal reflux disease)   . Headache    migraines  . Seasonal allergic conjunctivitis    Past Surgical History:  Procedure Laterality Date  . Left Knee Menuiscus     "several surguries on same knee"  . NM ESOPHAGEAL REFLUX    . Right Shoulder Surgery    . TONSILLECTOMY     adenoids    reports that he quit smoking about 2 weeks ago. His smoking use included Cigarettes. He smoked 0.50 packs per day. He has never used smokeless tobacco. He reports that he drinks alcohol. He reports that he uses drugs, including Marijuana. family history includes ADD / ADHD in his brother; Anxiety disorder in his maternal aunt, mother, and sister; Bone cancer in his paternal grandmother; Clotting disorder in his mother; Crohn's disease in his mother; Depression in his maternal aunt; Diabetes in his maternal grandmother, paternal aunt, and paternal grandfather; Drug abuse in his paternal uncle; Fibromyalgia in his mother and paternal aunt; Heart disease in his paternal grandfather; Hyperlipidemia in his father; Hypertension in his father and maternal  grandmother; Liver disease in his maternal grandmother; Lupus in his maternal aunt, mother, and paternal aunt; Migraines in his other; Seizures in his brother; Thyroid disease in his mother. No Known Allergies    Outpatient Encounter Prescriptions as of 05/02/2016  Medication Sig  . albuterol (PROVENTIL HFA;VENTOLIN HFA) 108 (90 Base) MCG/ACT inhaler Inhale 2 puffs into the lungs every 6 (six) hours as needed for wheezing or shortness of breath.  . escitalopram (LEXAPRO) 10 MG tablet Take 1 tablet (10 mg total) by mouth daily.  . fluticasone (FLONASE) 50 MCG/ACT nasal spray Place 2 sprays into both nostrils daily.  Marland Kitchen. loratadine (CLARITIN) 10 MG tablet Take 10 mg by mouth daily.  Marland Kitchen. omeprazole (PRILOSEC) 40 MG capsule Take 1 capsule (40 mg total) by mouth daily. Reported on 12/21/2015  . [DISCONTINUED] montelukast (SINGULAIR) 10 MG tablet Take 1 tablet (10 mg total) by mouth at bedtime.   No facility-administered encounter medications on file as of 05/02/2016.      REVIEW OF SYSTEMS  : All other systems reviewed and negative except where noted in the History of Present Illness.   PHYSICAL EXAM: BP 132/82 (BP Location: Left Arm, Patient Position: Sitting, Cuff Size: Normal)   Pulse 78   Ht 6' (1.829 m)  General: Well developed male in no acute distress Head: Normocephalic and atraumatic Eyes:  Sclerae anicteric, conjunctiva pink. Ears: Normal auditory acuity Lungs: Clear throughout to auscultation Heart: Regular rate  and rhythm Abdomen: Soft, non-distended.  Normal bowel sounds.  Non-tender. Musculoskeletal: Symmetrical with no gross deformities  Skin: No lesions on visible extremities Extremities: No edema  Neurological: Alert oriented x 4, grossly non-focal Psychological:  Alert and cooperative. Normal mood and affect  ASSESSMENT AND PLAN: -20 year old male with persistent vomiting improved some with daily omeprazole so likely acid reflux related. Also need to consider cyclic  vomiting related to cannabis use since he usually smokes marijuana daily. Had an episode of scant hematemesis one day last week which alarmed him. I don't think that it is unreasonable to perform EGD due to the length of his symptoms and the severity. We'll schedule him with Dr. Leone PayorGessner at patient's and his parents requested. He will continue his omeprazole 40 mg daily for now. I have discussed with him discontinuing his THC use.  The risks, benefits, and alternatives to EGD were discussed with the patient and he consents to proceed.    CC:  Waldon MerlMartin, William C, PA-C

## 2016-05-02 NOTE — Patient Instructions (Signed)

## 2016-05-04 ENCOUNTER — Other Ambulatory Visit (INDEPENDENT_AMBULATORY_CARE_PROVIDER_SITE_OTHER): Payer: Managed Care, Other (non HMO)

## 2016-05-04 DIAGNOSIS — Z0000 Encounter for general adult medical examination without abnormal findings: Secondary | ICD-10-CM | POA: Diagnosis not present

## 2016-05-04 LAB — URINALYSIS, ROUTINE W REFLEX MICROSCOPIC
BILIRUBIN URINE: NEGATIVE
Hgb urine dipstick: NEGATIVE
KETONES UR: NEGATIVE
LEUKOCYTES UA: NEGATIVE
Nitrite: NEGATIVE
PH: 6 (ref 5.0–8.0)
RBC / HPF: NONE SEEN (ref 0–?)
SPECIFIC GRAVITY, URINE: 1.025 (ref 1.000–1.030)
TOTAL PROTEIN, URINE-UPE24: 30 — AB
URINE GLUCOSE: NEGATIVE
UROBILINOGEN UA: 1 (ref 0.0–1.0)
WBC, UA: NONE SEEN (ref 0–?)

## 2016-05-04 LAB — LIPID PANEL
CHOL/HDL RATIO: 4
Cholesterol: 168 mg/dL (ref 0–200)
HDL: 41.6 mg/dL (ref >39.00)
LDL CALC: 105 mg/dL — AB (ref 0–99)
NONHDL: 126.42
Triglycerides: 109 mg/dL (ref 0.0–149.0)
VLDL: 21.8 mg/dL (ref 0.0–40.0)

## 2016-05-04 LAB — COMPREHENSIVE METABOLIC PANEL
ALBUMIN: 4.5 g/dL (ref 3.5–5.2)
ALT: 25 U/L (ref 0–53)
AST: 23 U/L (ref 0–37)
Alkaline Phosphatase: 69 U/L (ref 52–171)
BILIRUBIN TOTAL: 0.8 mg/dL (ref 0.2–1.2)
BUN: 14 mg/dL (ref 6–23)
CALCIUM: 9.5 mg/dL (ref 8.4–10.5)
CO2: 28 meq/L (ref 19–32)
CREATININE: 1 mg/dL (ref 0.40–1.50)
Chloride: 105 mEq/L (ref 96–112)
GFR: 101.72 mL/min (ref >60.00)
Glucose, Bld: 117 mg/dL — ABNORMAL HIGH (ref 70–99)
Potassium: 4 mEq/L (ref 3.5–5.1)
Sodium: 139 mEq/L (ref 135–145)
Total Protein: 7.1 g/dL (ref 6.0–8.3)

## 2016-05-04 LAB — HIV ANTIBODY (ROUTINE TESTING W REFLEX): HIV: NONREACTIVE

## 2016-05-14 ENCOUNTER — Ambulatory Visit: Payer: Managed Care, Other (non HMO) | Admitting: Diagnostic Neuroimaging

## 2016-05-15 ENCOUNTER — Encounter: Payer: Self-pay | Admitting: Diagnostic Neuroimaging

## 2016-05-20 NOTE — Progress Notes (Signed)
Agree with Ms. Zehr's management.  Carl E. Gessner, MD, FACG  

## 2016-05-25 ENCOUNTER — Encounter: Payer: Self-pay | Admitting: Internal Medicine

## 2016-05-25 ENCOUNTER — Ambulatory Visit (AMBULATORY_SURGERY_CENTER): Payer: Managed Care, Other (non HMO) | Admitting: Internal Medicine

## 2016-05-25 VITALS — BP 136/68 | HR 67 | Temp 97.8°F | Resp 20 | Ht 72.0 in | Wt 222.0 lb

## 2016-05-25 DIAGNOSIS — K2100 Gastro-esophageal reflux disease with esophagitis, without bleeding: Secondary | ICD-10-CM

## 2016-05-25 DIAGNOSIS — K920 Hematemesis: Secondary | ICD-10-CM

## 2016-05-25 DIAGNOSIS — G43A1 Cyclical vomiting, intractable: Secondary | ICD-10-CM | POA: Diagnosis present

## 2016-05-25 DIAGNOSIS — K219 Gastro-esophageal reflux disease without esophagitis: Secondary | ICD-10-CM

## 2016-05-25 MED ORDER — SODIUM CHLORIDE 0.9 % IV SOLN: 500.0000 mL | INTRAVENOUS | Status: DC

## 2016-05-25 NOTE — Progress Notes (Signed)
A/ox3 pleased with MAC, report to Penny RN 

## 2016-05-25 NOTE — Progress Notes (Signed)
Called to room to assist during endoscopic procedure.  Patient ID and intended procedure confirmed with present staff. Received instructions for my participation in the procedure from the performing physician.  

## 2016-05-25 NOTE — Op Note (Signed)
[facility] Patient Name: Andrew Castillo Procedure Date: 05/25/2016 3:48 PM MRN: [id number] Endoscopist: Iva Boop , MD Age: 20 Referring MD:  Date of Birth: 05/15/1996 Gender: Male Account #: [id number] Procedure:                Upper GI endoscopy Indications:              Hematemesis, Nausea with vomiting Medicines:                Propofol per Anesthesia, Monitored Anesthesia Care Procedure:                Pre-Anesthesia Assessment:                           - Prior to the procedure, a History and Physical                            was performed, and patient medications and                            allergies were reviewed. The patient's tolerance of                            previous anesthesia was also reviewed. The risks                            and benefits of the procedure and the sedation                            options and risks were discussed with the patient.                            All questions were answered, and informed consent                            was obtained. Prior Anticoagulants: The patient has                            taken no previous anticoagulant or antiplatelet                            agents. ASA Grade Assessment: II - A patient with                            mild systemic disease. After reviewing the risks                            and benefits, the patient was deemed in                            satisfactory condition to undergo the procedure.                           After obtaining informed consent, the endoscope was  passed under direct vision. Throughout the                            procedure, the patient's blood pressure, pulse, and                            oxygen saturations were monitored continuously. The                            Model GIF-[id number]) scope was introduced                            through the mouth, and advanced to the second part     of duodenum. The upper GI endoscopy was                            accomplished without difficulty. The patient                            tolerated the procedure well. Scope In: Scope Out: Findings:                 LA Grade B (one or more mucosal breaks greater than                            5 mm, not extending between the tops of two mucosal                            folds) esophagitis with no bleeding was found in                            the distal esophagus. Biopsies were taken with a                            cold forceps for histology. Verification of patient                            identification for the specimen was done. Estimated                            blood loss was minimal.                           The exam was otherwise without abnormality.                           The cardia and gastric fundus were normal on                            retroflexion. Complications:            No immediate complications. Estimated Blood Loss:     Estimated blood loss was minimal. Impression:               - LA Grade B reflux esophagitis. Biopsied.                           -  The examination was otherwise normal. Recommendation:           - Patient has a contact number available for                            emergencies. The signs and symptoms of potential                            delayed complications were discussed with the                            patient. Return to normal activities tomorrow.                            Written discharge instructions were provided to the                            patient.                           - Resume previous diet.                           - Continue present medications.                           - Await pathology results. Iva Boop, MD 05/25/2016 4:15:06 PM This report has been signed electronically.

## 2016-05-25 NOTE — Progress Notes (Signed)
Dental advisory given to patient 

## 2016-05-25 NOTE — Patient Instructions (Addendum)
There is inflammation right at the end of the esophagus that is consistent with acid reflux damage. I took biopsies to confirm.  Please stay on the omeprazole. Take it before breakfast every day.  Will send results and plans to you.  I appreciate the opportunity to care for you. Iva Booparl E. Brendaly Townsel, MD, FACG   YOU HAD AN ENDOSCOPIC PROCEDURE TODAY AT THE  ENDOSCOPY CENTER:   Refer to the procedure report that was given to you for any specific questions about what was found during the examination.  If the procedure report does not answer your questions, please call your gastroenterologist to clarify.  If you requested that your care partner not be given the details of your procedure findings, then the procedure report has been included in a sealed envelope for you to review at your convenience later.  YOU SHOULD EXPECT: Some feelings of bloating in the abdomen. Passage of more gas than usual.  Walking can help get rid of the air that was put into your GI tract during the procedure and reduce the bloating. If you had a lower endoscopy (such as a colonoscopy or flexible sigmoidoscopy) you may notice spotting of blood in your stool or on the toilet paper. If you underwent a bowel prep for your procedure, you may not have a normal bowel movement for a few days.  Please Note:  You might notice some irritation and congestion in your nose or some drainage.  This is from the oxygen used during your procedure.  There is no need for concern and it should clear up in a day or so.  SYMPTOMS TO REPORT IMMEDIATELY:     Following upper endoscopy (EGD)  Vomiting of blood or coffee ground material  New chest pain or pain under the shoulder blades  Painful or persistently difficult swallowing  New shortness of breath  Fever of 100F or higher  Black, tarry-looking stools  For urgent or emergent issues, a gastroenterologist can be reached at any hour by calling [phone].   DIET:  We do  recommend a small meal at first, but then you may proceed to your regular diet.  Drink plenty of fluids but you should avoid alcoholic beverages for 24 hours.  ACTIVITY:  You should plan to take it easy for the rest of today and you should NOT DRIVE or use heavy machinery until tomorrow (because of the sedation medicines used during the test).    FOLLOW UP: Our staff will call the number listed on your records the next business day following your procedure to check on you and address any questions or concerns that you may have regarding the information given to you following your procedure. If we do not reach you, we will leave a message.  However, if you are feeling well and you are not experiencing any problems, there is no need to return our call.  We will assume that you have returned to your regular daily activities without incident.  If any biopsies were taken you will be contacted by phone or by letter within the next 1-3 weeks.  Please call us at [phone] if you have not heard about the biopsies in 3 weeks.    SIGNATURES/CONFIDENTIALITY: You and/or your care partner have signed paperwork which will be entered into your electronic medical record.  These signatures attest to the fact that that the information above on your After Visit Summary has been reviewed and is understood.  Full responsibility of the confidentiality of  this discharge information lies with you and/or your care-partner.   Handout on GERD given to you today  Await biopsy results

## 2016-05-28 ENCOUNTER — Encounter (HOSPITAL_COMMUNITY): Payer: Self-pay | Admitting: Emergency Medicine

## 2016-05-28 ENCOUNTER — Telehealth: Payer: Self-pay | Admitting: *Deleted

## 2016-05-28 ENCOUNTER — Emergency Department (HOSPITAL_COMMUNITY): Payer: Managed Care, Other (non HMO)

## 2016-05-28 ENCOUNTER — Emergency Department (HOSPITAL_COMMUNITY)
Admission: EM | Admit: 2016-05-28 | Discharge: 2016-05-28 | Disposition: A | Discharge status: Home / Self Care | Payer: Managed Care, Other (non HMO) | Attending: Emergency Medicine | Admitting: Emergency Medicine

## 2016-05-28 DIAGNOSIS — Z791 Long term (current) use of non-steroidal anti-inflammatories (NSAID): Secondary | ICD-10-CM | POA: Insufficient documentation

## 2016-05-28 DIAGNOSIS — S0083XA Contusion of other part of head, initial encounter: Secondary | ICD-10-CM | POA: Insufficient documentation

## 2016-05-28 DIAGNOSIS — H1132 Conjunctival hemorrhage, left eye: Secondary | ICD-10-CM | POA: Insufficient documentation

## 2016-05-28 DIAGNOSIS — Y9229 Other specified public building as the place of occurrence of the external cause: Secondary | ICD-10-CM | POA: Insufficient documentation

## 2016-05-28 DIAGNOSIS — S60222A Contusion of left hand, initial encounter: Secondary | ICD-10-CM | POA: Diagnosis not present

## 2016-05-28 DIAGNOSIS — Z87891 Personal history of nicotine dependence: Secondary | ICD-10-CM | POA: Diagnosis not present

## 2016-05-28 DIAGNOSIS — Y999 Unspecified external cause status: Secondary | ICD-10-CM | POA: Insufficient documentation

## 2016-05-28 DIAGNOSIS — Y9389 Activity, other specified: Secondary | ICD-10-CM | POA: Insufficient documentation

## 2016-05-28 DIAGNOSIS — S0990XA Unspecified injury of head, initial encounter: Secondary | ICD-10-CM | POA: Diagnosis present

## 2016-05-28 DIAGNOSIS — J45909 Unspecified asthma, uncomplicated: Secondary | ICD-10-CM | POA: Insufficient documentation

## 2016-05-28 DIAGNOSIS — Z79899 Other long term (current) drug therapy: Secondary | ICD-10-CM | POA: Insufficient documentation

## 2016-05-28 MED ORDER — FLUORESCEIN SODIUM 1 MG OP STRP
1.0000 | ORAL_STRIP | Freq: Once | OPHTHALMIC | Status: AC
  Administered 2016-05-28: 1 via OPHTHALMIC
  Filled 2016-05-28: qty 1

## 2016-05-28 MED ORDER — TETRACAINE HCL 0.5 % OP SOLN
2.0000 [drp] | Freq: Once | OPHTHALMIC | Status: AC
  Administered 2016-05-28: 2 [drp] via OPHTHALMIC
  Filled 2016-05-28: qty 4

## 2016-05-28 MED ORDER — ONDANSETRON 8 MG PO TBDP
8.0000 mg | ORAL_TABLET | Freq: Once | ORAL | Status: AC
  Administered 2016-05-28: 8 mg via ORAL
  Filled 2016-05-28: qty 1

## 2016-05-28 MED ORDER — ACETAMINOPHEN 325 MG PO TABS
650.0000 mg | ORAL_TABLET | Freq: Once | ORAL | Status: AC
  Administered 2016-05-28: 650 mg via ORAL
  Filled 2016-05-28: qty 2
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
## 2016-05-28 NOTE — ED Notes (Signed)
Pt taken to radiology by CT techs

## 2016-05-28 NOTE — ED Notes (Signed)
Pt requested graham crackers and Sprite; denies nausea

## 2016-05-28 NOTE — Discharge Instructions (Signed)
Ice pack to the face and hand. If any blurred vision or continued pain to the left eye, please follow up with ophthalmology. Return if worsening symptoms.

## 2016-05-28 NOTE — Telephone Encounter (Signed)
  Follow up Call-  Call back number 05/25/2016  Post procedure Call Back phone  # [phone]  Permission to leave phone message Yes  Some recent data might be hidden     Patient questions:  Do you have a fever, pain , or abdominal swelling? No. Pain Score  0 *  Have you tolerated food without any problems? Yes.    Have you been able to return to your normal activities? Yes.    Do you have any questions about your discharge instructions: Diet   No. Medications  No. Follow up visit  No.  Do you have questions or concerns about your Care? No.  Actions: * If pain score is 4 or above: No action needed, pain <4.
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
## 2016-05-30 ENCOUNTER — Telehealth: Payer: Self-pay

## 2016-05-30 NOTE — Telephone Encounter (Signed)
-----   Message from Iva Booparl E Gessner, MD sent at 05/29/2016  9:50 PM EDT ----- Regarding: does not need appt it seems Has a 9/29 appt but saw Jess and I scoped home so think we can cancel this

## 2016-05-30 NOTE — Telephone Encounter (Signed)
I left a message for the patient that appt has been cancelled.

## 2016-06-01 NOTE — Progress Notes (Signed)
Biopsies show inflammation from acid reflux Stay on omeprazole See me as needed

## 2016-06-01 NOTE — Progress Notes (Signed)
No letter or recall needed My Chart message sent

## 2016-06-15 ENCOUNTER — Ambulatory Visit: Payer: Managed Care, Other (non HMO) | Admitting: Internal Medicine

## 2016-10-03 ENCOUNTER — Ambulatory Visit: Payer: Managed Care, Other (non HMO) | Admitting: Physician Assistant

## 2017-04-01 ENCOUNTER — Ambulatory Visit: Payer: Managed Care, Other (non HMO) | Admitting: Physician Assistant

## 2017-11-12 ENCOUNTER — Other Ambulatory Visit: Payer: Self-pay

## 2017-11-12 ENCOUNTER — Other Ambulatory Visit (HOSPITAL_COMMUNITY)
Admission: RE | Admit: 2017-11-12 | Discharge: 2017-11-12 | Disposition: A | Discharge status: Home / Self Care | Payer: Managed Care, Other (non HMO) | Source: Ambulatory Visit | Attending: Family Medicine | Admitting: Family Medicine

## 2017-11-12 ENCOUNTER — Ambulatory Visit: Payer: Managed Care, Other (non HMO) | Admitting: Family Medicine

## 2017-11-12 ENCOUNTER — Encounter: Payer: Self-pay | Admitting: Family Medicine

## 2017-11-12 VITALS — BP 132/82 | HR 74 | Temp 98.2°F | Resp 16 | Ht 71.0 in | Wt 203.2 lb

## 2017-11-12 DIAGNOSIS — N451 Epididymitis: Secondary | ICD-10-CM | POA: Diagnosis not present

## 2017-11-12 DIAGNOSIS — M545 Low back pain, unspecified: Secondary | ICD-10-CM

## 2017-11-12 DIAGNOSIS — R1030 Lower abdominal pain, unspecified: Secondary | ICD-10-CM | POA: Diagnosis present

## 2017-11-12 LAB — CBC WITH DIFFERENTIAL/PLATELET
Basophils Absolute: 0 10*3/uL (ref 0.0–0.1)
Basophils Relative: 0.5 % (ref 0.0–3.0)
EOS PCT: 2.7 % (ref 0.0–5.0)
Eosinophils Absolute: 0.2 10*3/uL (ref 0.0–0.7)
HCT: 43.9 % (ref 39.0–52.0)
Hemoglobin: 14.8 g/dL (ref 13.0–17.0)
LYMPHS ABS: 3.3 10*3/uL (ref 0.7–4.0)
Lymphocytes Relative: 38.9 % (ref 12.0–46.0)
MCHC: 33.8 g/dL (ref 30.0–36.0)
MCV: 86.8 fl (ref 78.0–100.0)
MONO ABS: 0.5 10*3/uL (ref 0.1–1.0)
Monocytes Relative: 6.1 % (ref 3.0–12.0)
NEUTROS ABS: 4.4 10*3/uL (ref 1.4–7.7)
NEUTROS PCT: 51.8 % (ref 43.0–77.0)
PLATELETS: 240 10*3/uL (ref 150.0–400.0)
RBC: 5.06 Mil/uL (ref 4.22–5.81)
RDW: 13.4 % (ref 11.5–15.5)
WBC: 8.6 10*3/uL (ref 4.0–10.5)

## 2017-11-12 LAB — POCT URINALYSIS DIPSTICK
Bilirubin, UA: NEGATIVE
Blood, UA: NEGATIVE
Glucose, UA: NEGATIVE
Ketones, UA: NEGATIVE
LEUKOCYTES UA: NEGATIVE
NITRITE UA: NEGATIVE
PROTEIN UA: NEGATIVE
Spec Grav, UA: >=1.03 — AB (ref 1.010–1.025)
Urobilinogen, UA: 0.2 E.U./dL
pH, UA: 6 (ref 5.0–8.0)

## 2017-11-12 LAB — COMPREHENSIVE METABOLIC PANEL
ALT: 13 U/L (ref 0–53)
AST: 11 U/L (ref 0–37)
Albumin: 4.4 g/dL (ref 3.5–5.2)
Alkaline Phosphatase: 60 U/L (ref 39–117)
BILIRUBIN TOTAL: 0.5 mg/dL (ref 0.2–1.2)
BUN: 12 mg/dL (ref 6–23)
CO2: 27 meq/L (ref 19–32)
Calcium: 9.6 mg/dL (ref 8.4–10.5)
Chloride: 105 mEq/L (ref 96–112)
Creatinine, Ser: 1.1 mg/dL (ref 0.40–1.50)
GFR: 89.75 mL/min (ref >60.00)
GLUCOSE: 89 mg/dL (ref 70–99)
POTASSIUM: 4.1 meq/L (ref 3.5–5.1)
SODIUM: 138 meq/L (ref 135–145)
TOTAL PROTEIN: 6.8 g/dL (ref 6.0–8.3)

## 2017-11-12 MED ORDER — CYCLOBENZAPRINE HCL 10 MG PO TABS: 10.0000 mg | ORAL_TABLET | Freq: Three times a day (TID) | ORAL | 0 refills | Status: DC | PRN

## 2017-11-12 MED ORDER — DICLOFENAC SODIUM 75 MG PO TBEC: 75.0000 mg | DELAYED_RELEASE_TABLET | Freq: Two times a day (BID) | ORAL | 0 refills | Status: DC

## 2017-11-12 NOTE — Progress Notes (Signed)
Subjective  CC:  Chief Complaint  Patient presents with  . Back Pain    Urine dark yellow/orange, sweating at night, lower pelvic area, testicle pain/sensitivity    HPI: Andrew Castillo is a 22 y.o. male who presents to the office today to address the problems listed above in the chief complaint.  22 year old male new patient to me here for work in visit.  He has several complaints the most concerning to him is acute onset of low back pain that started several days ago.  Pain is mainly in the middle but does travel bilaterally at times.  It is worse with position changes.  When it first started he was in the bed for a day due to pain and stiffness.  He is using over-the-counter medicines with some relief.  He notes that he moved a washer machine and dryer the week prior but did not recall an injury.  Today, he told his mom about his urine being dark orange and also complains of bilateral testicular pain although worse on right.  He denies penile discharge, penile sores.  He is sexually active and denies history of STDs.  He denies dysuria.  No fevers or chills although he has had some sweats due to his pain.  He also reports about a 45-month history of lower abdominal pain that is vague in nature.  Somewhat pressure-like.  He reports normal bowel movements without constipation.  He denies nausea or vomiting.  He admits to recent frequent cocaine use and marijuana use.  He denies alcohol abuse.  He is a non-smoker.  He stopped using these drugs about a week ago.  He is trying to stop using. I reviewed the patients updated PMH, FH, and SocHx.    Patient Active Problem List   Diagnosis Date Noted  . Nausea with vomiting 05/02/2016  . Esophageal reflux 05/02/2016  . Visit for preventive health examination 04/30/2016  . Seizure-like activity (HCC) 04/30/2016  . Anxiety and depression 11/21/2015  . Gastroesophageal reflux disease with esophagitis 07/15/2015  . Inattention 07/15/2015  .  RHINITIS, ALLERGIC 11/14/2006  . ASTHMA, UNSPECIFIED 11/14/2006  . ECZEMA, ATOPIC DERMATITIS 11/14/2006   Current Meds  Medication Sig  . fluticasone (FLONASE) 50 MCG/ACT nasal spray Place 2 sprays into both nostrils daily. (Patient taking differently: Place 2 sprays into both nostrils daily as needed for allergies. )  . ibuprofen (ADVIL,MOTRIN) 200 MG tablet Take 400 mg by mouth every 6 (six) hours as needed for fever, headache, mild pain, moderate pain or cramping.  . loratadine (CLARITIN) 10 MG tablet Take 10 mg by mouth daily.  Marland Kitchen omeprazole (PRILOSEC) 40 MG capsule Take 1 capsule (40 mg total) by mouth daily. Reported on 12/21/2015   Current Facility-Administered Medications for the 11/12/17 encounter (Office Visit) with Willow Ora, MD  Medication  . 0.9 %  sodium chloride infusion    Allergies: Patient has No Known Allergies. Family History: Patient family history includes ADD / ADHD in his brother; Anxiety disorder in his maternal aunt, mother, and sister; Bone cancer in his paternal grandmother; Clotting disorder in his mother; Crohn's disease in his mother; Depression in his maternal aunt; Diabetes in his maternal grandmother, paternal aunt, and paternal grandfather; Drug abuse in his paternal uncle; Fibromyalgia in his mother and paternal aunt; Heart disease in his paternal grandfather; Hyperlipidemia in his father; Hypertension in his father and maternal grandmother; Liver disease in his father and maternal grandmother; Lupus in his maternal aunt, mother, and paternal aunt;  Migraines in his other; Seizures in his brother; Thyroid disease in his mother. Social History:  Patient  reports that he quit smoking about 18 months ago. His smoking use included cigarettes. He smoked 0.50 packs per day. he has never used smokeless tobacco. He reports that he drinks alcohol. He reports that he uses drugs. Drug: Marijuana.  Review of Systems: Constitutional: Negative for fever malaise or  anorexia Cardiovascular: negative for chest pain Respiratory: negative for SOB or persistent cough Gastrointestinal: negative for abdominal pain  Objective  Vitals: BP 132/82   Pulse 74   Temp 98.2 F (36.8 C) (Oral)   Resp 16   Ht 5\' 11"  (1.803 m)   Wt 203 lb 3.2 oz (92.2 kg)   SpO2 98%   BMI 28.34 kg/m  General: no acute distress , A&Ox3, appears mildly uncomfortable with movement but nontoxic-appearing HEENT: PEERL, conjunctiva normal, Oropharynx moist,neck is supple Cardiovascular:  RRR without murmur or gallop.  Respiratory:  Good breath sounds bilaterally, CTAB with normal respiratory effort, no CVA tenderness Gastrointestinal: Soft, normal bowel sounds, no suprapubic tenderness, no inguinal hernias.  Nontender Testicular exam is notable for epididymal tenderness bilaterally without nodules or masses present.  No penile lesions or discharge Back exam with paravertebral muscle tenderness and pain with full extension and right lateral flexion..  Negative straight leg raise bilaterally Skin:  Warm, no rashes Office Visit on 11/12/2017  Component Date Value Ref Range Status  . Color, UA 11/12/2017 yellow   Final  . Clarity, UA 11/12/2017 clear   Final  . Glucose, UA 11/12/2017 negative   Final  . Bilirubin, UA 11/12/2017 negative   Final  . Ketones, UA 11/12/2017 negative   Final  . Spec Grav, UA 11/12/2017 >=1.030* 1.010 - 1.025 Final  . Blood, UA 11/12/2017 negative   Final  . pH, UA 11/12/2017 6.0  5.0 - 8.0 Final  . Protein, UA 11/12/2017 negative   Final  . Urobilinogen, UA 11/12/2017 0.2  0.2 or 1.0 E.U./dL Final  . Nitrite, UA 69/62/9528 negative   Final  . Leukocytes, UA 11/12/2017 Negative  Negative Final  . Sodium 11/12/2017 138  135 - 145 mEq/L Final  . Potassium 11/12/2017 4.1  3.5 - 5.1 mEq/L Final  . Chloride 11/12/2017 105  96 - 112 mEq/L Final  . CO2 11/12/2017 27  19 - 32 mEq/L Final  . Glucose, Bld 11/12/2017 89  70 - 99 mg/dL Final  . BUN 41/32/4401 12   6 - 23 mg/dL Final  . Creatinine, Ser 11/12/2017 1.10  0.40 - 1.50 mg/dL Final  . Total Bilirubin 11/12/2017 0.5  0.2 - 1.2 mg/dL Final  . Alkaline Phosphatase 11/12/2017 60  39 - 117 U/L Final  . AST 11/12/2017 11  0 - 37 U/L Final  . ALT 11/12/2017 13  0 - 53 U/L Final  . Total Protein 11/12/2017 6.8  6.0 - 8.3 g/dL Final  . Albumin 02/72/5366 4.4  3.5 - 5.2 g/dL Final  . Calcium 44/11/4740 9.6  8.4 - 10.5 mg/dL Final  . GFR 59/56/3875 89.75  >60.00 mL/min Final  . WBC 11/12/2017 8.6  4.0 - 10.5 K/uL Final  . RBC 11/12/2017 5.06  4.22 - 5.81 Mil/uL Final  . Hemoglobin 11/12/2017 14.8  13.0 - 17.0 g/dL Final  . HCT 64/33/2951 43.9  39.0 - 52.0 % Final  . MCV 11/12/2017 86.8  78.0 - 100.0 fl Final  . MCHC 11/12/2017 33.8  30.0 - 36.0 g/dL Final  . RDW  11/12/2017 13.4  11.5 - 15.5 % Final  . Platelets 11/12/2017 240.0  150.0 - 400.0 K/uL Final  . Neutrophils Relative % 11/12/2017 51.8  43.0 - 77.0 % Final  . Lymphocytes Relative 11/12/2017 38.9  12.0 - 46.0 % Final  . Monocytes Relative 11/12/2017 6.1  3.0 - 12.0 % Final  . Eosinophils Relative 11/12/2017 2.7  0.0 - 5.0 % Final  . Basophils Relative 11/12/2017 0.5  0.0 - 3.0 % Final  . Neutro Abs 11/12/2017 4.4  1.4 - 7.7 K/uL Final  . Lymphs Abs 11/12/2017 3.3  0.7 - 4.0 K/uL Final  . Monocytes Absolute 11/12/2017 0.5  0.1 - 1.0 K/uL Final  . Eosinophils Absolute 11/12/2017 0.2  0.0 - 0.7 K/uL Final  . Basophils Absolute 11/12/2017 0.0  0.0 - 0.1 K/uL Final  . HIV 1&2 Ab, 4th Generation 11/12/2017 NON-REACTIVE  NON-REACTI Final  . RPR Ser Ql 11/12/2017 NON-REACTIVE  NON-REACTI Final    Assessment  1. Acute bilateral low back pain without sciatica   2. Abdominal pain, lower   3. Epididymitis      Plan   Acute low back pain: Believe this is musculoskeletal in nature due to lumbar strain.  Educated.  Recommend NSAIDs, muscle relaxer, stretches and heat.  Lower abdominal pain: Unclear etiology.  Check lab work.  Recommend  follow-up with primary care doctor to further investigate this.  Epididymitis: Treat with antibiotics and rule out other STDs.  Follow up: Return in about 2 weeks (around 11/26/2017).  For recheck   Commons side effects, risks, benefits, and alternatives for medications and treatment plan prescribed today were discussed, and the patient expressed understanding of the given instructions. Patient is instructed to call or message via MyChart if he/she has any questions or concerns regarding our treatment plan. No barriers to understanding were identified. We discussed Red Flag symptoms and signs in detail. Patient expressed understanding regarding what to do in case of urgent or emergency type symptoms.   Medication list was reconciled, printed and provided to the patient in AVS. Patient instructions and summary information was reviewed with the patient as documented in the AVS. This note was prepared with assistance of Dragon voice recognition software. Occasional wrong-word or sound-a-like substitutions may have occurred due to the inherent limitations of voice recognition software  Orders Placed This Encounter  Procedures  . Comprehensive metabolic panel  . CBC with Differential/Platelet  . HIV antibody  . RPR  . POCT urinalysis dipstick   Meds ordered this encounter  Medications  . cyclobenzaprine (FLEXERIL) 10 MG tablet    Sig: Take 1 tablet (10 mg total) by mouth 3 (three) times daily as needed for muscle spasms.    Dispense:  30 tablet    Refill:  0  . diclofenac (VOLTAREN) 75 MG EC tablet    Sig: Take 1 tablet (75 mg total) by mouth 2 (two) times daily.    Dispense:  30 tablet    Refill:  0

## 2017-11-12 NOTE — Patient Instructions (Addendum)
Please return in 1-2 weeks with Andrew Castillo for f/u on back pain and testicular pain.  Get a thermometer to check your temperatures.  Use the voltaren and muscle relaxer to help the back pain.  Drink more water.  Do not use drugs.   I will release your lab results to you on your MyChart account with further instructions. Please reply with any questions.    If you have any questions or concerns, please don't hesitate to send me a message via MyChart or call the office at [phone]. Thank you for visiting with us today! It's our pleasure caring for you.   Epididymitis Epididymitis is swelling (inflammation) of the epididymis. The epididymis is a cord-like structure that is located along the top and back part of the testicle. It collects and stores sperm from the testicle. This condition can also cause pain and swelling of the testicle and scrotum. Symptoms usually start suddenly (acute epididymitis). Sometimes epididymitis starts gradually and lasts for a while (chronic epididymitis). This type may be harder to treat. What are the causes? In men 6235 and younger, this condition is usually caused by a bacterial infection or sexually transmitted disease (STD), such as:  Gonorrhea.  Chlamydia.  In men 4535 and older who do not have anal sex, this condition is usually caused by bacteria from a blockage or abnormalities in the urinary system. These can result from:  Having a tube placed into the bladder (urinary catheter).  Having an enlarged or inflamed prostate gland.  Having recent urinary tract surgery.  In men who have a condition that weakens the body's defense system (immune system), such as HIV, this condition can be caused by:  Other bacteria, including tuberculosis and syphilis.  Viruses.  Fungi.  Sometimes this condition occurs without infection. That may happen if urine flows backward into the epididymis after heavy lifting or straining. What increases the risk? This condition  is more likely to develop in men:  Who have unprotected sex with more than one partner.  Who have anal sex.  Who have recently had surgery.  Who have a urinary catheter.  Who have urinary problems.  Who have a suppressed immune system.  What are the signs or symptoms? This condition usually begins suddenly with chills, fever, and pain behind the scrotum and in the testicle. Other symptoms include:  Swelling of the scrotum, testicle, or both.  Pain whenejaculatingor urinating.  Pain in the back or belly.  Nausea.  Itching and discharge from the penis.  Frequent need to pass urine.  Redness and tenderness of the scrotum.  How is this diagnosed? Your health care provider can diagnose this condition based on your symptoms and medical history. Your health care provider will also do a physical exam to ask about your symptoms and check your scrotum and testicle for swelling, pain, and redness. You may also have other tests, including:  Examination of discharge from the penis.  Urine tests for infections, such as STDs.  Your health care provider may test you for other STDs, including HIV. How is this treated? Treatment for this condition depends on the cause. If your condition is caused by a bacterial infection, oral antibiotic medicine may be prescribed. If the bacterial infection has spread to your blood, you may need to receive IV antibiotics. Nonbacterial epididymitis is treated with home care that includes bed rest and elevation of the scrotum. Surgery may be needed to treat:  Bacterial epididymitis that causes pus to build up in the scrotum (abscess).  Chronic epididymitis that has not responded to other treatments.  Follow these instructions at home: Medicines  Take over-the-counter and prescription medicines only as told by your health care provider.  If you were prescribed an antibiotic medicine, take it as told by your health care provider. Do not stop taking  the antibiotic even if your condition improves. Sexual Activity  If your epididymitis was caused by an STD, avoid sexual activity until your treatment is complete.  Inform your sexual partner or partners if you test positive for an STD. They may need to be treated.Do not engage in sexual activity with your partner or partners until their treatment is completed. General instructions  Return to your normal activities as told by your health care provider. Ask your health care provider what activities are safe for you.  Keep your scrotum elevated and supported while resting. Ask your health care provider if you should wear a scrotal support, such as a jockstrap. Wear it as told by your health care provider.  If directed, apply ice to the affected area: ? Put ice in a plastic bag. ? Place a towel between your skin and the bag. ? Leave the ice on for 20 minutes, 2-3 times per day.  Try taking a sitz bath to help with discomfort. This is a warm water bath that is taken while you are sitting down. The water should only come up to your hips and should cover your buttocks. Do this 3-4 times per day or as told by your health care provider.  Keep all follow-up visits as told by your health care provider. This is important. Contact a health care provider if:  You have a fever.  Your pain medicine is not helping.  Your pain is getting worse.  Your symptoms do not improve within three days. This information is not intended to replace advice given to you by your health care provider. Make sure you discuss any questions you have with your health care provider. Document Released: 08/31/2000 Document Revised: 02/09/2016 Document Reviewed: 01/19/2015 Elsevier Interactive Patient Education  2018 [employer].

## 2017-11-13 LAB — HIV ANTIBODY (ROUTINE TESTING W REFLEX): HIV: NONREACTIVE

## 2017-11-13 LAB — RPR: RPR: NONREACTIVE

## 2017-11-13 LAB — URINE CYTOLOGY ANCILLARY ONLY
Chlamydia: NEGATIVE
Neisseria Gonorrhea: NEGATIVE

## 2017-11-19 ENCOUNTER — Ambulatory Visit: Payer: Managed Care, Other (non HMO) | Admitting: Physician Assistant

## 2017-11-19 ENCOUNTER — Other Ambulatory Visit: Payer: Self-pay

## 2017-11-19 ENCOUNTER — Encounter: Payer: Self-pay | Admitting: Physician Assistant

## 2017-11-19 VITALS — BP 120/86 | HR 59 | Temp 98.4°F | Resp 16 | Ht 71.0 in | Wt 203.0 lb

## 2017-11-19 DIAGNOSIS — M545 Low back pain: Secondary | ICD-10-CM

## 2017-11-19 DIAGNOSIS — Z23 Encounter for immunization: Secondary | ICD-10-CM

## 2017-11-19 DIAGNOSIS — N50819 Testicular pain, unspecified: Secondary | ICD-10-CM

## 2017-11-19 NOTE — Patient Instructions (Signed)
Please keep up with stretches.  Use the diclofenac as directed for a few more days to get the inflammation out of the lower back and groin region.  If symptoms are not continuing to resolve, please call or come see me ASAP.    Low Back Sprain Rehab Ask your health care provider which exercises are safe for you. Do exercises exactly as told by your health care provider and adjust them as directed. It is normal to feel mild stretching, pulling, tightness, or discomfort as you do these exercises, but you should stop right away if you feel sudden pain or your pain gets worse. Do not begin these exercises until told by your health care provider. Stretching and range of motion exercises These exercises warm up your muscles and joints and improve the movement and flexibility of your back. These exercises also help to relieve pain, numbness, and tingling. Exercise A: Lumbar rotation  1. Lie on your back on a firm surface and bend your knees. 2. Straighten your arms out to your sides so each arm forms an "L" shape with a side of your body (a 90 degree angle). 3. Slowly move both of your knees to one side of your body until you feel a stretch in your lower back. Try not to let your shoulders move off of the floor. 4. Hold for __________ seconds. 5. Tense your abdominal muscles and slowly move your knees back to the starting position. 6. Repeat this exercise on the other side of your body. Repeat __________ times. Complete this exercise __________ times a day. Exercise B: Prone extension on elbows  1. Lie on your abdomen on a firm surface. 2. Prop yourself up on your elbows. 3. Use your arms to help lift your chest up until you feel a gentle stretch in your abdomen and your lower back. ? This will place some of your body weight on your elbows. If this is uncomfortable, try stacking pillows under your chest. ? Your hips should stay down, against the surface that you are lying on. Keep your hip and back  muscles relaxed. 4. Hold for __________ seconds. 5. Slowly relax your upper body and return to the starting position. Repeat __________ times. Complete this exercise __________ times a day. Strengthening exercises These exercises build strength and endurance in your back. Endurance is the ability to use your muscles for a long time, even after they get tired. Exercise C: Pelvic tilt 1. Lie on your back on a firm surface. Bend your knees and keep your feet flat. 2. Tense your abdominal muscles. Tip your pelvis up toward the ceiling and flatten your lower back into the floor. ? To help with this exercise, you may place a small towel under your lower back and try to push your back into the towel. 3. Hold for __________ seconds. 4. Let your muscles relax completely before you repeat this exercise. Repeat __________ times. Complete this exercise __________ times a day. Exercise D: Alternating arm and leg raises  1. Get on your hands and knees on a firm surface. If you are on a hard floor, you may want to use padding to cushion your knees, such as an exercise mat. 2. Line up your arms and legs. Your hands should be below your shoulders, and your knees should be below your hips. 3. Lift your left leg behind you. At the same time, raise your right arm and straighten it in front of you. ? Do not lift your leg higher than  your hip. ? Do not lift your arm higher than your shoulder. ? Keep your abdominal and back muscles tight. ? Keep your hips facing the ground. ? Do not arch your back. ? Keep your balance carefully, and do not hold your breath. 4. Hold for __________ seconds. 5. Slowly return to the starting position and repeat with your right leg and your left arm. Repeat __________ times. Complete this exercise __________ times a day. Exercise E: Abdominal set with straight leg raise  1. Lie on your back on a firm surface. 2. Bend one of your knees and keep your other leg straight. 3. Tense  your abdominal muscles and lift your straight leg up, 4-6 inches (10-15 cm) off the ground. 4. Keep your abdominal muscles tight and hold for __________ seconds. ? Do not hold your breath. ? Do not arch your back. Keep it flat against the ground. 5. Keep your abdominal muscles tense as you slowly lower your leg back to the starting position. 6. Repeat with your other leg. Repeat __________ times. Complete this exercise __________ times a day. Posture and body mechanics  Body mechanics refers to the movements and positions of your body while you do your daily activities. Posture is part of body mechanics. Good posture and healthy body mechanics can help to relieve stress in your body's tissues and joints. Good posture means that your spine is in its natural S-curve position (your spine is neutral), your shoulders are pulled back slightly, and your head is not tipped forward. The following are general guidelines for applying improved posture and body mechanics to your everyday activities. Standing   When standing, keep your spine neutral and your feet about hip-width apart. Keep a slight bend in your knees. Your ears, shoulders, and hips should line up.  When you do a task in which you stand in one place for a long time, place one foot up on a stable object that is 2-4 inches (5-10 cm) high, such as a footstool. This helps keep your spine neutral. Sitting   When sitting, keep your spine neutral and keep your feet flat on the floor. Use a footrest, if necessary, and keep your thighs parallel to the floor. Avoid rounding your shoulders, and avoid tilting your head forward.  When working at a desk or a computer, keep your desk at a height where your hands are slightly lower than your elbows. Slide your chair under your desk so you are close enough to maintain good posture.  When working at a computer, place your monitor at a height where you are looking straight ahead and you do not have to tilt  your head forward or downward to look at the screen. Resting   When lying down and resting, avoid positions that are most painful for you.  If you have pain with activities such as sitting, bending, stooping, or squatting (flexion-based activities), lie in a position in which your body does not bend very much. For example, avoid curling up on your side with your arms and knees near your chest (fetal position).  If you have pain with activities such as standing for a long time or reaching with your arms (extension-based activities), lie with your spine in a neutral position and bend your knees slightly. Try the following positions:  Lying on your side with a pillow between your knees.  Lying on your back with a pillow under your knees. Lifting   When lifting objects, keep your feet at least shoulder-width apart  and tighten your abdominal muscles.  Bend your knees and hips and keep your spine neutral. It is important to lift using the strength of your legs, not your back. Do not lock your knees straight out.  Always ask for help to lift heavy or awkward objects. This information is not intended to replace advice given to you by your health care provider. Make sure you discuss any questions you have with your health care provider. Document Released: 09/03/2005 Document Revised: 05/10/2016 Document Reviewed: 06/15/2015 Elsevier Interactive Patient Education  [employer].

## 2017-11-19 NOTE — Progress Notes (Signed)
Patient presents to clinic today for follow-up of low back pain and epididymitis. At last visit, patient was started on Diclofenac and Flexeril for low back pain. Has taken as directed with significant improvement. As such has stopped medications and is focusing on stretches. Denies any radiation of pain into extremities, change to bowel or bladder habits, or saddle anesthesia. In regards to epididymal tenderness, STI panel negative. Felt to be non-infectious inflammation. Is resolved currently. Denies new symptoms.   Patient requesting flu shot today. .   Past Medical History:  Diagnosis Date  . Childhood asthma   . Environmental allergies   . GERD (gastroesophageal reflux disease)   . Headache    migraines  . Seasonal allergic conjunctivitis     Current Outpatient Medications on File Prior to Visit  Medication Sig Dispense Refill  . fluticasone (FLONASE) 50 MCG/ACT nasal spray Place 2 sprays into both nostrils daily. (Patient taking differently: Place 2 sprays into both nostrils daily as needed for allergies. ) 16 g 3  . ibuprofen (ADVIL,MOTRIN) 200 MG tablet Take 400 mg by mouth every 6 (six) hours as needed for fever, headache, mild pain, moderate pain or cramping.    . loratadine (CLARITIN) 10 MG tablet Take 10 mg by mouth daily.    . cyclobenzaprine (FLEXERIL) 10 MG tablet Take 1 tablet (10 mg total) by mouth 3 (three) times daily as needed for muscle spasms. (Patient not taking: Reported on 11/19/2017) 30 tablet 0  . diclofenac (VOLTAREN) 75 MG EC tablet Take 1 tablet (75 mg total) by mouth 2 (two) times daily. (Patient not taking: Reported on 11/19/2017) 30 tablet 0   No current facility-administered medications on file prior to visit.     No Known Allergies  Family History  Problem Relation Age of Onset  . Hypertension Father   . Hyperlipidemia Father        Living  . Liver disease Father   . Fibromyalgia Mother   . Thyroid disease Mother        Living  . Lupus Mother     . Anxiety disorder Mother   . Clotting disorder Mother   . Crohn's disease Mother   . Bone cancer Paternal Grandmother   . Heart disease Paternal Grandfather   . Diabetes Paternal Grandfather   . Diabetes Maternal Grandmother   . Hypertension Maternal Grandmother   . Liver disease Maternal Grandmother   . Anxiety disorder Maternal Aunt   . Depression Maternal Aunt   . Lupus Maternal Aunt   . Diabetes Paternal Aunt   . Lupus Paternal Aunt   . Fibromyalgia Paternal Aunt   . Drug abuse Paternal Uncle   . ADD / ADHD Brother   . Anxiety disorder Sister   . Migraines Other        Various  . Seizures Brother     Social History   Socioeconomic History  . Marital status: Single    Spouse name: None  . Number of children: 0  . Years of education: 4912  . Highest education level: None  Social Needs  . Financial resource strain: None  . Food insecurity - worry: None  . Food insecurity - inability: None  . Transportation needs - medical: None  . Transportation needs - non-medical: None  Occupational History  . Occupation: Consulting civil engineertudent    Comment: [employer]    Comment: works at [employer]  Tobacco Use  . Smoking status: Former Smoker    Packs/day: 0.50    Types:  Cigarettes    Last attempt to quit: 04/17/2016    Years since quitting: 1.5  . Smokeless tobacco: Never Used  . Tobacco comment: 04/18/16 1 pack a week  Substance and Sexual Activity  . Alcohol use: Yes    Alcohol/week: 0.0 oz    Comment: socially  . Drug use: Yes    Types: Marijuana    Comment: daily  . Sexual activity: None  Other Topics Concern  . None  Social History Narrative   Lives with aunt   No caffeine    Review of Systems - See HPI.  All other ROS are negative.  There were no vitals taken for this visit.  Physical Exam  Constitutional: He is oriented to person, place, and time and well-developed, well-nourished, and in no distress.  HENT:  Head: Normocephalic and atraumatic.  Eyes: Conjunctivae are  normal.  Neck: Neck supple.  Cardiovascular: Normal rate, regular rhythm, normal heart sounds and intact distal pulses.  Pulmonary/Chest: Effort normal and breath sounds normal. No respiratory distress. He has no wheezes. He has no rales. He exhibits no tenderness.  Musculoskeletal:       Lumbar back: Normal.  Neurological: He is alert and oriented to person, place, and time.  Skin: Skin is warm and dry. No rash noted.  Psychiatric: Affect normal.  Vitals reviewed.  Recent Results (from the past 2160 hour(s))  Urine cytology ancillary only     Status: None   Collection Time: 11/12/17 12:00 AM  Result Value Ref Range   Chlamydia Negative     Comment: Normal Reference Range - Negative   Neisseria gonorrhea Negative     Comment: Normal Reference Range - Negative  POCT urinalysis dipstick     Status: Abnormal   Collection Time: 11/12/17  1:19 PM  Result Value Ref Range   Color, UA yellow    Clarity, UA clear    Glucose, UA negative    Bilirubin, UA negative    Ketones, UA negative    Spec Grav, UA >=1.030 (A) 1.010 - 1.025   Blood, UA negative    pH, UA 6.0 5.0 - 8.0   Protein, UA negative    Urobilinogen, UA 0.2 0.2 or 1.0 E.U./dL   Nitrite, UA negative    Leukocytes, UA Negative Negative   Appearance     Odor    Comprehensive metabolic panel     Status: None   Collection Time: 11/12/17  1:44 PM  Result Value Ref Range   Sodium 138 135 - 145 mEq/L   Potassium 4.1 3.5 - 5.1 mEq/L   Chloride 105 96 - 112 mEq/L   CO2 27 19 - 32 mEq/L   Glucose, Bld 89 70 - 99 mg/dL   BUN 12 6 - 23 mg/dL   Creatinine, Ser 1.61 0.40 - 1.50 mg/dL   Total Bilirubin 0.5 0.2 - 1.2 mg/dL   Alkaline Phosphatase 60 39 - 117 U/L   AST 11 0 - 37 U/L   ALT 13 0 - 53 U/L   Total Protein 6.8 6.0 - 8.3 g/dL   Albumin 4.4 3.5 - 5.2 g/dL   Calcium 9.6 8.4 - 09.6 mg/dL   GFR 04.54 >09.81 mL/min  CBC with Differential/Platelet     Status: None   Collection Time: 11/12/17  1:44 PM  Result Value Ref  Range   WBC 8.6 4.0 - 10.5 K/uL   RBC 5.06 4.22 - 5.81 Mil/uL   Hemoglobin 14.8 13.0 - 17.0 g/dL   HCT  43.9 39.0 - 52.0 %   MCV 86.8 78.0 - 100.0 fl   MCHC 33.8 30.0 - 36.0 g/dL   RDW 40.9 81.1 - 91.4 %   Platelets 240.0 150.0 - 400.0 K/uL   Neutrophils Relative % 51.8 43.0 - 77.0 %   Lymphocytes Relative 38.9 12.0 - 46.0 %   Monocytes Relative 6.1 3.0 - 12.0 %   Eosinophils Relative 2.7 0.0 - 5.0 %   Basophils Relative 0.5 0.0 - 3.0 %   Neutro Abs 4.4 1.4 - 7.7 K/uL   Lymphs Abs 3.3 0.7 - 4.0 K/uL   Monocytes Absolute 0.5 0.1 - 1.0 K/uL   Eosinophils Absolute 0.2 0.0 - 0.7 K/uL   Basophils Absolute 0.0 0.0 - 0.1 K/uL  HIV antibody     Status: None   Collection Time: 11/12/17  1:51 PM  Result Value Ref Range   HIV 1&2 Ab, 4th Generation NON-REACTIVE NON-REACTI    Comment: HIV-1 antigen and HIV-1/HIV-2 antibodies were not detected. There is no laboratory evidence of HIV infection. Marland Kitchen PLEASE NOTE: This information has been disclosed to you from records whose confidentiality may be protected by state law.  If your state requires such protection, then the state law prohibits you from making any further disclosure of the information without the specific written consent of the person to whom it pertains, or as otherwise permitted by law. A general authorization for the release of medical or other information is NOT sufficient for this purpose. . For additional information please refer to http://education.questdiagnostics.com/faq/FAQ106 (This link is being provided for informational/ educational purposes only.) . Marland Kitchen The performance of this assay has not been clinically validated in patients less than 69 years old. .   RPR     Status: None   Collection Time: 11/12/17  1:51 PM  Result Value Ref Range   RPR Ser Ql NON-REACTIVE NON-REACTI    Assessment/Plan: 1. Epididymal pain Resolved. Supportive measures reviewed. Again STI panel negative. Nothing further needed. Discussed  would need [place] for recurrence.   2. Acute bilateral low back pain, with sciatica presence unspecified Resolving. Continue Diclofenac. Stretches reviewed and handout given.    [name], PA-C

## 2018-02-19 ENCOUNTER — Encounter (HOSPITAL_COMMUNITY): Payer: Self-pay | Admitting: Emergency Medicine

## 2018-02-19 ENCOUNTER — Emergency Department (HOSPITAL_COMMUNITY)
Admission: EM | Admit: 2018-02-19 | Discharge: 2018-02-19 | Disposition: A | Discharge status: Home / Self Care | Payer: Managed Care, Other (non HMO) | Attending: Emergency Medicine | Admitting: Emergency Medicine

## 2018-02-19 DIAGNOSIS — R519 Headache, unspecified: Secondary | ICD-10-CM

## 2018-02-19 DIAGNOSIS — R569 Unspecified convulsions: Secondary | ICD-10-CM | POA: Diagnosis not present

## 2018-02-19 DIAGNOSIS — R51 Headache: Secondary | ICD-10-CM | POA: Diagnosis not present

## 2018-02-19 DIAGNOSIS — R112 Nausea with vomiting, unspecified: Secondary | ICD-10-CM | POA: Diagnosis not present

## 2018-02-19 DIAGNOSIS — Z87891 Personal history of nicotine dependence: Secondary | ICD-10-CM | POA: Diagnosis not present

## 2018-02-19 LAB — COMPREHENSIVE METABOLIC PANEL
ALBUMIN: 5.1 g/dL — AB (ref 3.5–5.0)
ALT: 26 U/L (ref 17–63)
ANION GAP: 7 (ref 5–15)
AST: 23 U/L (ref 15–41)
Alkaline Phosphatase: 67 U/L (ref 38–126)
BUN: 12 mg/dL (ref 6–20)
CO2: 25 mmol/L (ref 22–32)
Calcium: 9.3 mg/dL (ref 8.9–10.3)
Chloride: 104 mmol/L (ref 101–111)
Creatinine, Ser: 1.1 mg/dL (ref 0.61–1.24)
GFR calc non Af Amer: >60 mL/min (ref >60)
GLUCOSE: 96 mg/dL (ref 65–99)
Potassium: 3.9 mmol/L (ref 3.5–5.1)
SODIUM: 136 mmol/L (ref 135–145)
TOTAL PROTEIN: 8.4 g/dL — AB (ref 6.5–8.1)
Total Bilirubin: 0.6 mg/dL (ref 0.3–1.2)

## 2018-02-19 LAB — CBC
HEMATOCRIT: 48.4 % (ref 39.0–52.0)
HEMOGLOBIN: 16.9 g/dL (ref 13.0–17.0)
MCH: 30.3 pg (ref 26.0–34.0)
MCHC: 34.9 g/dL (ref 30.0–36.0)
MCV: 86.7 fL (ref 78.0–100.0)
Platelets: 244 10*3/uL (ref 150–400)
RBC: 5.58 MIL/uL (ref 4.22–5.81)
RDW: 13 % (ref 11.5–15.5)
WBC: 21.8 10*3/uL — ABNORMAL HIGH (ref 4.0–10.5)

## 2018-02-19 LAB — URINALYSIS, ROUTINE W REFLEX MICROSCOPIC
Bacteria, UA: NONE SEEN
Bilirubin Urine: NEGATIVE
GLUCOSE, UA: NEGATIVE mg/dL
Ketones, ur: 5 mg/dL — AB
Leukocytes, UA: NEGATIVE
NITRITE: NEGATIVE
PROTEIN: 30 mg/dL — AB
Specific Gravity, Urine: 1.016 (ref 1.005–1.030)
pH: 5 (ref 5.0–8.0)

## 2018-02-19 LAB — LIPASE, BLOOD: LIPASE: 25 U/L (ref 11–51)

## 2018-02-19 MED ORDER — ONDANSETRON HCL 4 MG/2ML IJ SOLN
4.0000 mg | Freq: Once | INTRAMUSCULAR | Status: AC
  Administered 2018-02-19: 4 mg via INTRAVENOUS
  Filled 2018-02-19: qty 2

## 2018-02-19 MED ORDER — SODIUM CHLORIDE 0.9 % IV BOLUS
1000.0000 mL | Freq: Once | INTRAVENOUS | Status: AC
  Administered 2018-02-19: 1000 mL via INTRAVENOUS

## 2018-02-19 MED ORDER — ONDANSETRON 4 MG PO TBDP: 4.0000 mg | ORAL_TABLET | Freq: Three times a day (TID) | ORAL | 0 refills | Status: DC | PRN

## 2018-02-19 MED ORDER — KETOROLAC TROMETHAMINE 30 MG/ML IJ SOLN
30.0000 mg | Freq: Once | INTRAMUSCULAR | Status: AC
  Administered 2018-02-19: 30 mg via INTRAVENOUS
  Filled 2018-02-19: qty 1

## 2018-02-19 NOTE — ED Triage Notes (Addendum)
Patient here from home with complaints of seizure this morning at 9am. States that he has been vomiting since then. Reports consuming beer last night. States that he has hx of seizures but has not had one in awhile.

## 2018-02-19 NOTE — ED Provider Notes (Signed)
[facility]-EMERGENCY DEPT Provider Note   CSN: [id number] Arrival date & time: 02/19/18  1339     History   Chief Complaint Chief Complaint  Patient presents with  . Seizures  . Emesis    HPI Gypsy LoreKameron L Stinger is a 22 y.o. male.  22 year old male with past medical history including asthma, eczema, and seizures who presents with seizure and vomiting.  Patient states that around 10 AM this morning, he was laying on his couch sleeping and the next thing he knew, he woke up on the floor and it appeared that he had had a seizure and fallen.  He bumped his left knee during the fall and bit his tongue.  No bowel/bladder incontinence.  He has had a headache since the episode.  The symptoms are consistent with previous seizures.  He states that he had seizures as a child and then did not have seizures for a long time until one year ago when he had another episode.  [facility] and they decided to hold off on any medications at that time.  He reportedly had another episode a few months ago and did not seek medical attention at the time.  He has not seen [facility] in follow-up since then.  Since the episode, he has had multiple episodes of nausea and vomiting that are intermittent.  He denies any associated abdominal pain, fevers, diarrhea, or cough/cold symptoms.  No sick contacts.  No medications prior to arrival.  He drinks 3 beers last night, states that he drinks a few days a week but denies daily drinking or withdrawal symptoms.  Occasional marijuana use, no other drug use.  The history is provided by the patient.  Seizures   Associated symptoms include vomiting.  Emesis      Past Medical History:  Diagnosis Date  . Childhood asthma   . Environmental allergies   . GERD (gastroesophageal reflux disease)   . Headache    migraines  . Seasonal allergic conjunctivitis     Patient Active Problem List   Diagnosis Date Noted  . Nausea  with vomiting 05/02/2016  . Esophageal reflux 05/02/2016  . Visit for preventive health examination 04/30/2016  . Seizure-like activity (HCC) 04/30/2016  . Anxiety and depression 11/21/2015  . Gastroesophageal reflux disease with esophagitis 07/15/2015  . Inattention 07/15/2015  . RHINITIS, ALLERGIC 11/14/2006  . ASTHMA, UNSPECIFIED 11/14/2006  . ECZEMA, ATOPIC DERMATITIS 11/14/2006    Past Surgical History:  Procedure Laterality Date  . Left Knee Menuiscus     "several surguries on same knee"  . NM ESOPHAGEAL REFLUX    . Right Shoulder Surgery    . TONSILLECTOMY     adenoids        Home Medications    Prior to Admission medications   Medication Sig Start Date End Date Taking? Authorizing Provider  cyclobenzaprine (FLEXERIL) 10 MG tablet Take 1 tablet (10 mg total) by mouth 3 (three) times daily as needed for muscle spasms. Patient not taking: Reported on 11/19/2017 11/12/17   Willow OraAndy, Camille L, MD  diclofenac (VOLTAREN) 75 MG EC tablet Take 1 tablet (75 mg total) by mouth 2 (two) times daily. Patient not taking: Reported on 11/19/2017 11/12/17   Willow OraAndy, Camille L, MD  fluticasone [facility](FLONASE) 50 MCG/ACT nasal spray Place 2 sprays into both nostrils daily. Patient not taking: Reported on 02/19/2018 02/02/16   Saguier, Ramon DredgeEdward, PA-C  ondansetron (ZOFRAN ODT) 4 MG disintegrating tablet Take 1 tablet (4 mg  total) by mouth every 8 (eight) hours as needed for nausea or vomiting. 02/19/18   Letha Mirabal, Ambrose Finland, MD    Family History Family History  Problem Relation Age of Onset  . Hypertension Father   . Hyperlipidemia Father        Living  . Liver disease Father   . Fibromyalgia Mother   . Thyroid disease Mother        Living  . Lupus Mother   . Anxiety disorder Mother   . Clotting disorder Mother   . Crohn's disease Mother   . Bone cancer Paternal Grandmother   . Heart disease Paternal Grandfather   . Diabetes Paternal Grandfather   . Diabetes Maternal Grandmother   . Hypertension  Maternal Grandmother   . Liver disease Maternal Grandmother   . Anxiety disorder Maternal Aunt   . Depression Maternal Aunt   . Lupus Maternal Aunt   . Diabetes Paternal Aunt   . Lupus Paternal Aunt   . Fibromyalgia Paternal Aunt   . Drug abuse Paternal Uncle   . ADD / ADHD Brother   . Anxiety disorder Sister   . Migraines Other        Various  . Seizures Brother     Social History Social History   Tobacco Use  . Smoking status: Former Smoker    Packs/day: 0.50    Types: Cigarettes    Last attempt to quit: 04/17/2016    Years since quitting: 1.8  . Smokeless tobacco: Never Used  . Tobacco comment: 04/18/16 1 pack a week  Substance Use Topics  . Alcohol use: Yes    Alcohol/week: 0.0 oz    Comment: socially  . Drug use: Yes    Types: Marijuana    Comment: daily     Allergies   Patient has no known allergies.   Review of Systems Review of Systems  Gastrointestinal: Positive for vomiting.  Neurological: Positive for seizures.   All other systems reviewed and are negative except that which was mentioned in HPI   Physical Exam Updated Vital Signs BP (!) 145/96 (BP Location: Right Arm)   Pulse 84   Temp 98.2 F (36.8 C) (Oral)   Resp 18   SpO2 100%   Physical Exam  Constitutional: He is oriented to person, place, and time. He appears well-developed and well-nourished. No distress.  Awake, alert  HENT:  Head: Normocephalic and atraumatic.  Abrasion R side of tongue  Eyes: Pupils are equal, round, and reactive to light. Conjunctivae and EOM are normal.  Neck: Neck supple.  Cardiovascular: Normal rate, regular rhythm and normal heart sounds.  No murmur heard. Pulmonary/Chest: Effort normal and breath sounds normal. No respiratory distress.  Abdominal: Soft. Bowel sounds are normal. He exhibits no distension. There is tenderness (mid-epigastric). There is no rebound and no guarding.  Musculoskeletal: He exhibits no edema.  Neurological: He is alert and oriented  to person, place, and time. He has normal reflexes. No cranial nerve deficit. He exhibits normal muscle tone.  Fluent speech, normal finger-to-nose testing, negative pronator drift, no clonus 5/5 strength and normal sensation x all 4 extremities  Skin: Skin is warm and dry.  Small ecchymosis L knee  Psychiatric: He has a normal mood and affect. Judgment and thought content normal.  Nursing note and vitals reviewed.    ED Treatments / Results  Labs (all labs ordered are listed, but only abnormal results are displayed) Labs Reviewed  COMPREHENSIVE METABOLIC PANEL - Abnormal; Notable for the  following components:      Result Value   Total Protein 8.4 (*)    Albumin 5.1 (*)    All other components within normal limits  CBC - Abnormal; Notable for the following components:   WBC 21.8 (*)    All other components within normal limits  URINALYSIS, ROUTINE W REFLEX MICROSCOPIC - Abnormal; Notable for the following components:   APPearance CLOUDY (*)    Hgb urine dipstick MODERATE (*)    Ketones, ur 5 (*)    Protein, ur 30 (*)    All other components within normal limits  LIPASE, BLOOD    EKG None  Radiology No results found.  Procedures Procedures (including critical care time)  Medications Ordered in ED Medications  ketorolac (TORADOL) 30 MG/ML injection 30 mg (30 mg Intravenous Given 02/19/18 2104)  sodium chloride 0.9 % bolus 1,000 mL (1,000 mLs Intravenous New Bag/Given 02/19/18 2104)  ondansetron (ZOFRAN) injection 4 mg (4 mg Intravenous Given 02/19/18 2104)     Initial Impression / Assessment and Plan / ED Course  I have reviewed the triage vital signs and the nursing notes.  Pertinent labs & imaging results that were available during my care of the patient were reviewed by me and considered in my medical decision making (see chart for details).    Patient awake and alert with reassuring vital signs, neurologically intact on exam.  Normal neurologic exam.  His labs show  WBC 21.8 which I feel is likely a stress response as he does not have any other evidence of acute infection.  Gave Toradol, IV fluid bolus, and Zofran for his symptoms.  I do suspect that he may have had a seizure given his story and history.  I contacted [facility] as the patient's mother stated that he had been there previously.  Discussed with Dr. Anne Hahn, who reviewed chart and did not see any outpatient visits previously.  He advised that patient should be seen in clinic and will arrange for contacted to schedule an appointment.  No medications for now.  I had a long discussion regarding driving restrictions per [facility] law as well as precautions against climbing ladders, swimming alone.  I discussed avoiding any potential precipitants of seizure activity including sleep deprivation, dehydration, or substance use.  Patient stated he felt much improved after above medications and ate a [place] sandwich and drink water in the ED.  Extensively reviewed return precautions with patient and his mom and they voiced understanding of plan.  Patient discharged in satisfactory condition.  Final Clinical Impressions(s) / ED Diagnoses   Final diagnoses:  Seizure-like activity (HCC)  Non-intractable vomiting with nausea, unspecified vomiting type  Acute nonintractable headache, unspecified headache type    ED Discharge Orders        Ordered    ondansetron (ZOFRAN ODT) 4 MG disintegrating tablet  Every 8 hours PRN     02/19/18 2203       Armani Gawlik, Ambrose Finland, MD 02/19/18 2214

## 2018-02-19 NOTE — ED Notes (Signed)
Pt ate a [place] sandwich and drank a cup of water.

## 2018-02-19 NOTE — ED Notes (Signed)
Also sent a urine culture down

## 2018-03-12 ENCOUNTER — Telehealth: Payer: Self-pay | Admitting: Neurology

## 2018-03-12 NOTE — Telephone Encounter (Signed)
I called and LM on VM for pt to call and schedule appt dg

## 2018-09-25 ENCOUNTER — Ambulatory Visit (INDEPENDENT_AMBULATORY_CARE_PROVIDER_SITE_OTHER): Payer: Managed Care, Other (non HMO) | Admitting: Family Medicine

## 2018-09-25 ENCOUNTER — Encounter: Payer: Self-pay | Admitting: Family Medicine

## 2018-09-25 ENCOUNTER — Other Ambulatory Visit: Payer: Self-pay

## 2018-09-25 VITALS — BP 120/89 | HR 74 | Temp 97.9°F | Resp 16 | Ht 71.0 in | Wt 225.5 lb

## 2018-09-25 DIAGNOSIS — R112 Nausea with vomiting, unspecified: Secondary | ICD-10-CM | POA: Diagnosis not present

## 2018-09-25 DIAGNOSIS — R52 Pain, unspecified: Secondary | ICD-10-CM

## 2018-09-25 LAB — POCT INFLUENZA A/B
INFLUENZA A, POC: NEGATIVE
INFLUENZA B, POC: NEGATIVE

## 2018-09-25 MED ORDER — PROMETHAZINE HCL 25 MG/ML IJ SOLN
25.0000 mg | Freq: Once | INTRAMUSCULAR | Status: AC
  Administered 2018-09-25: 25 mg via INTRAMUSCULAR

## 2018-09-25 NOTE — Progress Notes (Signed)
   Subjective:    Patient ID: Andrew Castillo, male    DOB: 07-14-1996, 23 y.o.   MRN: [id number]  HPI Vomiting- sxs started 1-2 days ago.  Started w/ bodyaches and HAs.  + cold sweats.  Vomiting started last night.  Vomiting is nearly continuous.  + flu exposure.  + diarrhea.  Also started last night.  No others vomiting/diarrhea.  + 'very bad' abd pain.  Pain is currently epigastric.  + cough- due to smoking.  No ear pain.     Review of Systems For ROS see HPI     Objective:   Physical Exam Vitals signs reviewed.  Constitutional:      General: He is in acute distress.     Appearance: He is ill-appearing and diaphoretic.  Cardiovascular:     Rate and Rhythm: Normal rate and regular rhythm.  Pulmonary:     Effort: Pulmonary effort is normal. No respiratory distress.     Breath sounds: Normal breath sounds. No wheezing.  Abdominal:     Tenderness: There is abdominal tenderness (extreme TTP over epigastrum which prompted immediate vomiting). There is guarding (voluntary guarding).  Neurological:     Mental Status: He is alert.           Assessment & Plan:  Intractable vomiting- new.  Pt is unable to stop vomiting despite administration of IM Phenergan.  Pt reports severe epigastric pain and palpation prompted immediate vomiting.  Flu test is negative.  Pt is diaphoretic, clearly uncomfortable, unable to stop vomiting since last night.  Dehydration is now becoming a concern- as is severe abdominal pain.  Pt admits to heavy drinking on 1/4 but not since.  Unclear if this is pancreatitis vs viral illness vs other issue.  Will need this to be worked up in ER while receiving IVF.  Pt and mom expressed understanding and agreement w/ plan.

## 2018-09-25 NOTE — Patient Instructions (Signed)
Please go to the Emergency Room of your choice Due to the continuous vomiting (despite IM Phenergan), I am worried about dehydration and the need for IV fluids I am also worried about the severity of your abdominal pain- this needs to be assessed Call with any questions or concerns Hang in there!!!

## 2019-06-23 ENCOUNTER — Telehealth: Payer: Self-pay | Admitting: Physician Assistant

## 2019-06-23 NOTE — Telephone Encounter (Signed)
Error

## 2019-06-24 ENCOUNTER — Encounter: Payer: Self-pay | Admitting: Physician Assistant

## 2019-06-24 ENCOUNTER — Ambulatory Visit (INDEPENDENT_AMBULATORY_CARE_PROVIDER_SITE_OTHER): Payer: Managed Care, Other (non HMO) | Admitting: Physician Assistant

## 2019-06-24 ENCOUNTER — Other Ambulatory Visit: Payer: Self-pay

## 2019-06-24 DIAGNOSIS — F17200 Nicotine dependence, unspecified, uncomplicated: Secondary | ICD-10-CM | POA: Diagnosis not present

## 2019-06-24 DIAGNOSIS — Z720 Tobacco use: Secondary | ICD-10-CM | POA: Insufficient documentation

## 2019-06-24 MED ORDER — NICOTINE 14 MG/24HR TD PT24: 14.0000 mg | MEDICATED_PATCH | Freq: Every day | TRANSDERMAL | 0 refills | Status: DC

## 2019-06-24 NOTE — Progress Notes (Signed)
I have discussed the procedure for the virtual visit with the patient who has given consent to proceed with assessment and treatment.   Andrew Castillo S Rykin Route, CMA     

## 2019-06-24 NOTE — Patient Instructions (Signed)
Instructions sent to MyChart.   Please start the Nicoderm patches, using as directed.  After about 3 weeks if you feel you are doing well, let me know and we can go down to the next dose patch.  Try to set a goal for yourself of when you would like to quit by -- can be a date within the next 1-2 months.   Hang in there!

## 2019-06-24 NOTE — Progress Notes (Signed)
   Virtual Visit via Video   I connected with patient on 06/24/19 at  8:00 AM EDT by a video enabled telemedicine application and verified that I am speaking with the correct person using two identifiers.  Location patient: Home Location provider: [facility], Office Persons participating in the virtual visit: Patient, Provider, [place] (Patina Moore)  I discussed the limitations of evaluation and management by telemedicine and the availability of in person appointments. The patient expressed understanding and agreed to proceed.  Subjective:   HPI:   Patient presents via D[employer]Me today to discuss smoking cessation. Is currently smoking about 1/2 ppd for 2 years. Tried to quit himself by going cold [place]. This only lasted a few days. Notes starting to have a cough in the morning that is sometimes  Denies anxiety or stress. Denies wheezing. Has not tried anything OTC or Rx.   ROS:   See pertinent positives and negatives per HPI.  Patient Active Problem List   Diagnosis Date Noted  . Nausea with vomiting 05/02/2016  . Esophageal reflux 05/02/2016  . Visit for preventive health examination 04/30/2016  . Seizure-like activity ([place]) 04/30/2016  . Anxiety and depression 11/21/2015  . Gastroesophageal reflux disease with esophagitis 07/15/2015  . Inattention 07/15/2015  . RHINITIS, ALLERGIC 11/14/2006  . ASTHMA, UNSPECIFIED 11/14/2006  . ECZEMA, ATOPIC DERMATITIS 11/14/2006    Social History   Tobacco Use  . Smoking status: Current Every Day Smoker    Packs/day: 0.50    Types: Cigarettes    Last attempt to quit: 04/17/2016    Years since quitting: 3.1  . Smokeless tobacco: Never Used  . Tobacco comment: 1 pack every 2-3 days  Substance Use Topics  . Alcohol use: Yes    Alcohol/week: 0.0 standard drinks    Comment: socially   No current outpatient medications on file.  Allergies  Allergen Reactions  . Penicillins Hives    Objective:   There were no vitals taken  for this visit.  Patient is well-developed, well-nourished in no acute distress.  Resting comfortably at home.  Head is normocephalic, atraumatic.  No labored breathing.  Speech is clear and coherent with logical content.  Patient is alert and oriented at baseline.   Assessment and Plan:   1. Tobacco use disorder Discussed cessation options. Patient is ready and willing to quit. Trying to avoid Chantix as first line due to big family history of mood disorders and personal history of anxiety. He would like to start with patches. Will start at 14 mg/24 hr dose since he is a lighter smoker. Will then wean to 7 mg patch after initial prescription is finished. Follow-up discussed. - nicotine (NICODERM CQ) 14 mg/24hr patch; Place 1 patch (14 mg total) onto the skin daily.  Dispense: 28 patch; Refill: 0 .   Leeanne Rio, PA-C 06/24/2019

## 2019-07-17 ENCOUNTER — Ambulatory Visit (INDEPENDENT_AMBULATORY_CARE_PROVIDER_SITE_OTHER): Payer: Managed Care, Other (non HMO) | Admitting: Physician Assistant

## 2019-07-17 ENCOUNTER — Encounter: Payer: Self-pay | Admitting: Physician Assistant

## 2019-07-17 ENCOUNTER — Other Ambulatory Visit: Payer: Self-pay

## 2019-07-17 DIAGNOSIS — Z20828 Contact with and (suspected) exposure to other viral communicable diseases: Secondary | ICD-10-CM | POA: Diagnosis not present

## 2019-07-17 DIAGNOSIS — Z20822 Contact with and (suspected) exposure to covid-19: Secondary | ICD-10-CM

## 2019-07-17 NOTE — Progress Notes (Signed)
I have discussed the procedure for the virtual visit with the patient who has given consent to proceed with assessment and treatment.   Sevon Rotert S Gerene Nedd, CMA     

## 2019-07-17 NOTE — Patient Instructions (Signed)
Instructions sent to MyChart.   [address] [place], [place] for drive through COVID testing this morning.   Remain quarantined until results are in and symptoms are resolved.   Tylenol for fever. Mucinex-DM for cough and congestion. Keep hydrated and get plenty of rest.  Complete the symptom monitoring program questions daily. ER for any worsening of breathing.      Person Under Monitoring Name: Andrew LoreKameron L Castillo  Location: [address] [place] [id number]   Infection Prevention Recommendations for Individuals Confirmed to have, or Being Evaluated for, 2019 Novel Coronavirus (COVID-19) Infection Who Receive Care at Home  Individuals who are confirmed to have, or are being evaluated for, COVID-19 should follow the prevention steps below until a healthcare provider or local or state health department says they can return to normal activities.  Stay home except to get medical care You should restrict activities outside your home, except for getting medical care. Do not go to work, school, or public areas, and do not use public transportation or taxis.  Call ahead before visiting your doctor Before your medical appointment, call the healthcare provider and tell them that you have, or are being evaluated for, COVID-19 infection. This will help the healthcare provider's office take steps to keep other people from getting infected. Ask your healthcare provider to call the local or state health department.  Monitor your symptoms Seek prompt medical attention if your illness is worsening (e.g., difficulty breathing). Before going to your medical appointment, call the healthcare provider and tell them that you have, or are being evaluated for, COVID-19 infection. Ask your healthcare provider to call the local or state health department.  Wear a facemask You should wear a facemask that covers your nose and mouth when you are in the same room with other people and  when you visit a healthcare provider. People who live with or visit you should also wear a facemask while they are in the same room with you.  Separate yourself from other people in your home As much as possible, you should stay in a different room from other people in your home. Also, you should use a separate bathroom, if available.  Avoid sharing household items You should not share dishes, drinking glasses, cups, eating utensils, towels, bedding, or other items with other people in your home. After using these items, you should wash them thoroughly with soap and water.  Cover your coughs and sneezes Cover your mouth and nose with a tissue when you cough or sneeze, or you can cough or sneeze into your sleeve. Throw used tissues in a lined trash can, and immediately wash your hands with soap and water for at least 20 seconds or use an alcohol-based hand rub.  Wash your [employer]hands Wash your hands often and thoroughly with soap and water for at least 20 seconds. You can use an alcohol-based hand sanitizer if soap and water are not available and if your hands are not visibly dirty. Avoid touching your eyes, nose, and mouth with unwashed hands.   Prevention Steps for Caregivers and Household Members of Individuals Confirmed to have, or Being Evaluated for, COVID-19 Infection Being Cared for in the Home  If you live with, or provide care at home for, a person confirmed to have, or being evaluated for, COVID-19 infection please follow these guidelines to prevent infection:  Follow healthcare provider's instructions Make sure that you understand and can help the patient follow any healthcare provider instructions for  all care.  Provide for the patient's basic needs You should help the patient with basic needs in the home and provide support for getting groceries, prescriptions, and other personal needs.  Monitor the patient's symptoms If they are getting sicker, call his or her medical  provider and tell them that the patient has, or is being evaluated for, COVID-19 infection. This will help the healthcare provider's office take steps to keep other people from getting infected. Ask the healthcare provider to call the local or state health department.  Limit the number of people who have contact with the patient  If possible, have only one caregiver for the patient.  Other household members should stay in another home or place of residence. If this is not possible, they should stay  in another room, or be separated from the patient as much as possible. Use a separate bathroom, if available.  Restrict visitors who do not have an essential need to be in the home.  Keep older adults, very young children, and other sick people away from the patient Keep older adults, very young children, and those who have compromised immune systems or chronic health conditions away from the patient. This includes people with chronic heart, lung, or kidney conditions, diabetes, and cancer.  Ensure good ventilation Make sure that shared spaces in the home have good air flow, such as from an air conditioner or an opened window, weather permitting.  Wash your hands often  Wash your hands often and thoroughly with soap and water for at least 20 seconds. You can use an alcohol based hand sanitizer if soap and water are not available and if your hands are not visibly dirty.  Avoid touching your eyes, nose, and mouth with unwashed hands.  Use disposable paper towels to dry your hands. If not available, use dedicated cloth towels and replace them when they become wet.  Wear a facemask and gloves  Wear a disposable facemask at all times in the room and gloves when you touch or have contact with the patient's blood, body fluids, and/or secretions or excretions, such as sweat, saliva, sputum, nasal mucus, vomit, urine, or feces.  Ensure the mask fits over your nose and mouth tightly, and do not touch  it during use.  Throw out disposable facemasks and gloves after using them. Do not reuse.  Wash your hands immediately after removing your facemask and gloves.  If your personal clothing becomes contaminated, carefully remove clothing and launder. Wash your hands after handling contaminated clothing.  Place all used disposable facemasks, gloves, and other waste in a lined container before disposing them with other household waste.  Remove gloves and wash your hands immediately after handling these items.  Do not share dishes, glasses, or other household items with the patient  Avoid sharing household items. You should not share dishes, drinking glasses, cups, eating utensils, towels, bedding, or other items with a patient who is confirmed to have, or being evaluated for, COVID-19 infection.  After the person uses these items, you should wash them thoroughly with soap and water.  Wash laundry thoroughly  Immediately remove and wash clothes or bedding that have blood, body fluids, and/or secretions or excretions, such as sweat, saliva, sputum, nasal mucus, vomit, urine, or feces, on them.  Wear gloves when handling laundry from the patient.  Read and follow directions on labels of laundry or clothing items and detergent. In general, wash and dry with the warmest temperatures recommended on the label.  Clean all  areas the individual has used often  Clean all touchable surfaces, such as counters, tabletops, doorknobs, bathroom fixtures, toilets, phones, keyboards, tablets, and bedside tables, every day. Also, clean any surfaces that may have blood, body fluids, and/or secretions or excretions on them.  Wear gloves when cleaning surfaces the patient has come in contact with.  Use a diluted bleach solution (e.g., dilute bleach with 1 part bleach and 10 parts water) or a household disinfectant with a label that says EPA-registered for coronaviruses. To make a bleach solution at home, add 1  tablespoon of bleach to 1 quart (4 cups) of water. For a larger supply, add  cup of bleach to 1 gallon (16 cups) of water.  Read labels of cleaning products and follow recommendations provided on product labels. Labels contain instructions for safe and effective use of the cleaning product including precautions you should take when applying the product, such as wearing gloves or eye protection and making sure you have good ventilation during use of the product.  Remove gloves and wash hands immediately after cleaning.  Monitor yourself for signs and symptoms of illness Caregivers and household members are considered close contacts, should monitor their health, and will be asked to limit movement outside of the home to the extent possible. Follow the monitoring steps for close contacts listed on the symptom monitoring form.   ? If you have additional questions, contact your local health department or call the epidemiologist on call at [phone] (available 24/7). ? This guidance is subject to change. For the most up-to-date guidance from [facility], please refer to their website: [email or web address]

## 2019-07-17 NOTE — Progress Notes (Signed)
   Virtual Visit via Video   I connected with patient on 07/17/19 at 10:00 AM EDT by a video enabled telemedicine application and verified that I am speaking with the correct person using two identifiers.  Location patient: Home Location provider: [facility] Office Persons participating in the virtual visit: Patient, Provider, PA-Student Drucilla Schmidt), CMA (Eduard Clos)  I discussed the limitations of evaluation and management by telemedicine and the availability of in person appointments. The patient expressed understanding and agreed to proceed.  Subjective:   HPI:   Patient presents via [facility] today c/o URI symptoms s/p exposure to COVID. Symptoms began on Wednesday, endorses productive cough with yellow sputum, congestion, headache, diarrhea, shortness of breath, difficulty breathing when lying supine at night. Symptoms have remained constant, feels like they are moving to his chest, describes it as a "weight sitting on my chest". Has taken Nyquil before bed to help him sleep. Had poor appetite on Wednesday, has improved. Saturday came in contact with family members (sister, brother-in-law, niece) who tested positive. Mom and dad tests were negative.  Denies chest pain, palpitations, dizziness, loss of taste and smell. Has not checked temperature but endorses possible subjective fevers with chills Wednesday.   ROS:   See pertinent positives and negatives per HPI.  Patient Active Problem List   Diagnosis Date Noted  . Tobacco use disorder 06/24/2019  . Nausea with vomiting 05/02/2016  . Esophageal reflux 05/02/2016  . Visit for preventive health examination 04/30/2016  . Seizure-like activity ([place]) 04/30/2016  . Anxiety and depression 11/21/2015  . Gastroesophageal reflux disease with esophagitis 07/15/2015  . Inattention 07/15/2015  . RHINITIS, ALLERGIC 11/14/2006  . ASTHMA, UNSPECIFIED 11/14/2006  . ECZEMA, ATOPIC DERMATITIS 11/14/2006    Social History    Tobacco Use  . Smoking status: Current Every Day Smoker    Packs/day: 0.50    Types: Cigarettes    Last attempt to quit: 04/17/2016    Years since quitting: 3.2  . Smokeless tobacco: Never Used  . Tobacco comment: 1 pack every 2-3 days  Substance Use Topics  . Alcohol use: Yes    Alcohol/week: 0.0 standard drinks    Comment: socially    Current Outpatient Medications:  .  nicotine (NICODERM CQ) 14 mg/24hr patch, Place 1 patch (14 mg total) onto the skin daily., Disp: 28 patch, Rfl: 0  Allergies  Allergen Reactions  . Penicillins Hives    Objective:   There were no vitals taken for this visit.  Patient is well-developed, well-nourished in no acute distress.  Resting comfortably on couch at home.  Head is normocephalic, atraumatic.  No labored breathing.  Speech is clear and coherent with logical content.  Patient is alert and oriented at baseline.   Assessment and Plan:   1. Suspected COVID-19 virus infection Patient going for testing this morning. He is to quarantine until results are in. Work note written. Increase fluids. Rest. Start Mucinex-DM BID. Tylenol for aches or any fever. Strict ER precautions reviewed with patient. Handout sent to MyChart.     Leeanne Rio, PA-C 07/17/2019

## 2019-10-03 ENCOUNTER — Other Ambulatory Visit: Payer: Self-pay

## 2019-10-03 ENCOUNTER — Encounter (HOSPITAL_COMMUNITY): Payer: Self-pay | Admitting: *Deleted

## 2019-10-03 ENCOUNTER — Emergency Department (HOSPITAL_COMMUNITY)
Admission: EM | Admit: 2019-10-03 | Discharge: 2019-10-03 | Disposition: A | Discharge status: Home / Self Care | Payer: Managed Care, Other (non HMO) | Attending: Emergency Medicine | Admitting: Emergency Medicine

## 2019-10-03 DIAGNOSIS — J45909 Unspecified asthma, uncomplicated: Secondary | ICD-10-CM | POA: Insufficient documentation

## 2019-10-03 DIAGNOSIS — F17210 Nicotine dependence, cigarettes, uncomplicated: Secondary | ICD-10-CM | POA: Insufficient documentation

## 2019-10-03 DIAGNOSIS — R197 Diarrhea, unspecified: Secondary | ICD-10-CM | POA: Diagnosis not present

## 2019-10-03 DIAGNOSIS — R112 Nausea with vomiting, unspecified: Secondary | ICD-10-CM | POA: Insufficient documentation

## 2019-10-03 LAB — COMPREHENSIVE METABOLIC PANEL
ALT: 22 U/L (ref 0–44)
AST: 19 U/L (ref 15–41)
Albumin: 4.3 g/dL (ref 3.5–5.0)
Alkaline Phosphatase: 58 U/L (ref 38–126)
Anion gap: 14 (ref 5–15)
BUN: 15 mg/dL (ref 6–20)
CO2: 21 mmol/L — ABNORMAL LOW (ref 22–32)
Calcium: 9.5 mg/dL (ref 8.9–10.3)
Chloride: 103 mmol/L (ref 98–111)
Creatinine, Ser: 1.12 mg/dL (ref 0.61–1.24)
GFR calc Af Amer: >60 mL/min (ref >60)
GFR calc non Af Amer: >60 mL/min (ref >60)
Glucose, Bld: 149 mg/dL — ABNORMAL HIGH (ref 70–99)
Potassium: 4.1 mmol/L (ref 3.5–5.1)
Sodium: 138 mmol/L (ref 135–145)
Total Bilirubin: 1.3 mg/dL — ABNORMAL HIGH (ref 0.3–1.2)
Total Protein: 8.1 g/dL (ref 6.5–8.1)

## 2019-10-03 LAB — CBC
HCT: 49.9 % (ref 39.0–52.0)
Hemoglobin: 16.7 g/dL (ref 13.0–17.0)
MCH: 29 pg (ref 26.0–34.0)
MCHC: 33.5 g/dL (ref 30.0–36.0)
MCV: 86.8 fL (ref 80.0–100.0)
Platelets: 274 10*3/uL (ref 150–400)
RBC: 5.75 MIL/uL (ref 4.22–5.81)
RDW: 12.9 % (ref 11.5–15.5)
WBC: 16.1 10*3/uL — ABNORMAL HIGH (ref 4.0–10.5)
nRBC: 0 % (ref 0.0–0.2)

## 2019-10-03 LAB — LIPASE, BLOOD: Lipase: 24 U/L (ref 11–51)

## 2019-10-03 MED ORDER — ONDANSETRON 4 MG PO TBDP: 4.0000 mg | ORAL_TABLET | Freq: Three times a day (TID) | ORAL | 0 refills | Status: DC | PRN

## 2019-10-03 MED ORDER — ONDANSETRON 4 MG PO TBDP
4.0000 mg | ORAL_TABLET | Freq: Once | ORAL | Status: AC | PRN
  Administered 2019-10-03: 4 mg via ORAL
  Filled 2019-10-03: qty 1

## 2019-10-03 MED ORDER — PROMETHAZINE HCL 25 MG/ML IJ SOLN
25.0000 mg | Freq: Once | INTRAMUSCULAR | Status: AC
  Administered 2019-10-03: 25 mg via INTRAVENOUS
  Filled 2019-10-03: qty 1

## 2019-10-03 MED ORDER — SODIUM CHLORIDE 0.9 % IV BOLUS
1000.0000 mL | Freq: Once | INTRAVENOUS | Status: AC
  Administered 2019-10-03: 20:00:00 1000 mL via INTRAVENOUS

## 2019-10-03 MED ORDER — SODIUM CHLORIDE 0.9% FLUSH
3.0000 mL | Freq: Once | INTRAVENOUS | Status: AC
  Administered 2019-10-03: 20:00:00 3 mL via INTRAVENOUS

## 2019-10-03 NOTE — ED Notes (Signed)
Discharge instructions reviewed with pt. Pt verbalized understanding.   

## 2019-10-03 NOTE — Discharge Instructions (Addendum)
  Your testing did not show any specific abnormal findings - other than an elevated White Blood cell count which is non specific and should get back to normal when you are better You should have this rechecked in 1 week  Drink plenty of fluids Zofran every 6 hours as needed for nausea ER for increased pain / vomiting / fever or blood in the stool or vomiting This should improve over the next 2-3 days

## 2019-10-03 NOTE — ED Triage Notes (Signed)
Pt c/o abd paijN NAUSEA VOMITING AND DIARRHEA SINCE LAST PM.  HIS FATHER AND BROTHWER JUST GOT OVER THE SAME SYMPTOMS

## 2019-10-03 NOTE — ED Provider Notes (Signed)
[facility] EMERGENCY DEPARTMENT Provider Note   CSN: [id number] Arrival date & time: 10/03/19  1825     History Chief Complaint  Patient presents with  . Emesis    Andrew Castillo is a 24 y.o. male.  HPI   This patient is a very pleasant 24 year old male, he has no significant chronic medical problems, presents to the hospital today with a complaint of nausea vomiting and diarrhea which started last night.  He reports that he had 2 family members including his father who were sick with this several days ago, they both improved after 48 hours however his symptoms started last night.  He states that the diarrhea is watery, multiple episodes, nonbloody.  The emesis is like stomach contents of a yellow ascitic look to it.  There is no blood in the vomit.  He endorses a mild diffuse abdominal aching that gets worse when he vomits.  It is nonfocal.  He denies any prior abdominal surgery.  Denies any recent travel, exposure to other sick people other than the above-mentioned family members and denies any recent antibiotics.  He has not had any medications prior to arrival, was given Zofran and had labs drawn on arrival to the hospital.  Past Medical History:  Diagnosis Date  . Childhood asthma   . Environmental allergies   . GERD (gastroesophageal reflux disease)   . Headache    migraines  . Seasonal allergic conjunctivitis     Patient Active Problem List   Diagnosis Date Noted  . Tobacco use disorder 06/24/2019  . Nausea with vomiting 05/02/2016  . Esophageal reflux 05/02/2016  . Visit for preventive health examination 04/30/2016  . Seizure-like activity ([facility]) 04/30/2016  . Anxiety and depression 11/21/2015  . Gastroesophageal reflux disease with esophagitis 07/15/2015  . Inattention 07/15/2015  . RHINITIS, ALLERGIC 11/14/2006  . ASTHMA, UNSPECIFIED 11/14/2006  . ECZEMA, ATOPIC DERMATITIS 11/14/2006    Past Surgical History:  Procedure Laterality Date  .  Left Knee Menuiscus     "several surguries on same knee"  . NM ESOPHAGEAL REFLUX    . Right Shoulder Surgery    . TONSILLECTOMY     adenoids       Family History  Problem Relation Age of Onset  . Hypertension Father   . Hyperlipidemia Father        Living  . Liver disease Father   . Fibromyalgia Mother   . Thyroid disease Mother        Living  . Lupus Mother   . Anxiety disorder Mother   . Clotting disorder Mother   . Crohn's disease Mother   . Bone cancer Paternal Grandmother   . Heart disease Paternal Grandfather   . Diabetes Paternal Grandfather   . Diabetes Maternal Grandmother   . Hypertension Maternal Grandmother   . Liver disease Maternal Grandmother   . Anxiety disorder Maternal Aunt   . Depression Maternal Aunt   . Lupus Maternal Aunt   . Diabetes Paternal Aunt   . Lupus Paternal Aunt   . Fibromyalgia Paternal Aunt   . Drug abuse Paternal Uncle   . ADD / ADHD Brother   . Anxiety disorder Sister   . Migraines Other        Various  . Seizures Brother     Social History   Tobacco Use  . Smoking status: Current Every Day Smoker    Packs/day: 0.50    Types: Cigarettes    Last attempt to quit: 04/17/2016  Years since quitting: 3.4  . Smokeless tobacco: Never Used  . Tobacco comment: 1 pack every 2-3 days  Substance Use Topics  . Alcohol use: Yes    Alcohol/week: 0.0 standard drinks    Comment: socially  . Drug use: Yes    Types: Marijuana    Comment: daily    Home Medications Prior to Admission medications   Medication Sig Start Date End Date Taking? Authorizing Provider  nicotine (NICODERM CQ) 14 mg/24hr patch Place 1 patch (14 mg total) onto the skin daily. 06/24/19   Waldon Merl, PA-C    Allergies    Penicillins  Review of Systems   Review of Systems  All other systems reviewed and are negative.   Physical Exam Updated Vital Signs BP 137/86 (BP Location: Right Arm)   Pulse 95   Temp 98.7 F (37.1 C) (Oral)   Resp (!) 24    Ht 1.829 m (6')   Wt 88.5 kg   SpO2 99%   BMI 26.45 kg/m   Physical Exam Vitals and nursing note reviewed.  Constitutional:      General: He is not in acute distress.    Appearance: He is well-developed.  HENT:     Head: Normocephalic and atraumatic.     Mouth/Throat:     Pharynx: No oropharyngeal exudate.     Comments: Mucous membranes appear very normal Eyes:     General: No scleral icterus.       Right eye: No discharge.        Left eye: No discharge.     Conjunctiva/sclera: Conjunctivae normal.     Pupils: Pupils are equal, round, and reactive to light.  Neck:     Thyroid: No thyromegaly.     Vascular: No JVD.  Cardiovascular:     Rate and Rhythm: Normal rate and regular rhythm.     Heart sounds: Normal heart sounds. No murmur. No friction rub. No gallop.   Pulmonary:     Effort: Pulmonary effort is normal. No respiratory distress.     Breath sounds: Normal breath sounds. No wheezing or rales.  Abdominal:     General: Bowel sounds are normal. There is no distension.     Palpations: Abdomen is soft. There is no mass.     Tenderness: There is abdominal tenderness.     Comments: The abdomen is diffusely soft, minimally tender with no guarding or peritoneal signs, no Murphy sign, no specific tenderness at McBurney's point  Musculoskeletal:        General: No tenderness. Normal range of motion.     Cervical back: Normal range of motion and neck supple.  Lymphadenopathy:     Cervical: No cervical adenopathy.  Skin:    General: Skin is warm and dry.     Findings: No erythema or rash.  Neurological:     Mental Status: He is alert.     Coordination: Coordination normal.  Psychiatric:        Behavior: Behavior normal.     ED Results / Procedures / Treatments   Labs (all labs ordered are listed, but only abnormal results are displayed) Labs Reviewed  COMPREHENSIVE METABOLIC PANEL - Abnormal; Notable for the following components:      Result Value   CO2 21 (*)     Glucose, Bld 149 (*)    Total Bilirubin 1.3 (*)    All other components within normal limits  CBC - Abnormal; Notable for the following components:   WBC 16.1 (*)  All other components within normal limits  LIPASE, BLOOD    EKG None  Radiology No results found.  Procedures Procedures (including critical care time)  Medications Ordered in ED Medications  sodium chloride flush (NS) 0.9 % injection 3 mL (has no administration in time range)  promethazine (PHENERGAN) injection 25 mg (has no administration in time range)  sodium chloride 0.9 % bolus 1,000 mL (has no administration in time range)  ondansetron (ZOFRAN-ODT) disintegrating tablet 4 mg (4 mg Oral Given 10/03/19 1850)    ED Course  I have reviewed the triage vital signs and the nursing notes.  Pertinent labs & imaging results that were available during my care of the patient were reviewed by me and considered in my medical decision making (see chart for details).    MDM Rules/Calculators/A&P                      Overall this patient appears well, he does have a gastrointestinal illness which is likely a viral gastroenteritis.  There is no respiratory symptoms, no headache, no sore throat, no coughing or shortness of breath and neither did his family members.  They spontaneously improved after 48 hours and I suspect he has the same illness.  At this time he does have a white blood cell count 16,000 not surprising given the process, vital signs are totally normal, he is not tachycardic hypotensive or febrile.  I see no indication for formal imaging, he will be given IV fluids and some antiemetics, he appears overall stable for discharge.  Final Clinical Impression(s) / ED Diagnoses Final diagnoses:  Nausea vomiting and diarrhea    Rx / DC Orders ED Discharge Orders         Ordered    ondansetron (ZOFRAN ODT) 4 MG disintegrating tablet  Every 8 hours PRN     10/03/19 Jacinto Halim, MD 10/03/19  1942

## 2019-10-06 ENCOUNTER — Telehealth: Payer: Self-pay | Admitting: *Deleted

## 2019-10-06 ENCOUNTER — Emergency Department (HOSPITAL_COMMUNITY): Payer: Managed Care, Other (non HMO)

## 2019-10-06 ENCOUNTER — Observation Stay (HOSPITAL_COMMUNITY)
Admission: EM | Admit: 2019-10-06 | Discharge: 2019-10-07 | Disposition: A | Discharge status: Home / Self Care | Payer: Managed Care, Other (non HMO) | Attending: Family Medicine | Admitting: Family Medicine

## 2019-10-06 ENCOUNTER — Other Ambulatory Visit: Payer: Self-pay

## 2019-10-06 ENCOUNTER — Encounter (HOSPITAL_COMMUNITY): Payer: Self-pay | Admitting: Emergency Medicine

## 2019-10-06 DIAGNOSIS — Z832 Family history of diseases of the blood and blood-forming organs and certain disorders involving the immune mechanism: Secondary | ICD-10-CM | POA: Diagnosis not present

## 2019-10-06 DIAGNOSIS — Z8249 Family history of ischemic heart disease and other diseases of the circulatory system: Secondary | ICD-10-CM | POA: Diagnosis not present

## 2019-10-06 DIAGNOSIS — F17210 Nicotine dependence, cigarettes, uncomplicated: Secondary | ICD-10-CM | POA: Diagnosis not present

## 2019-10-06 DIAGNOSIS — K2100 Gastro-esophageal reflux disease with esophagitis, without bleeding: Secondary | ICD-10-CM | POA: Diagnosis not present

## 2019-10-06 DIAGNOSIS — N179 Acute kidney failure, unspecified: Secondary | ICD-10-CM | POA: Diagnosis not present

## 2019-10-06 DIAGNOSIS — Z8489 Family history of other specified conditions: Secondary | ICD-10-CM | POA: Insufficient documentation

## 2019-10-06 DIAGNOSIS — K529 Noninfective gastroenteritis and colitis, unspecified: Secondary | ICD-10-CM | POA: Diagnosis not present

## 2019-10-06 DIAGNOSIS — F419 Anxiety disorder, unspecified: Secondary | ICD-10-CM | POA: Insufficient documentation

## 2019-10-06 DIAGNOSIS — Z880 Allergy status to penicillin: Secondary | ICD-10-CM | POA: Insufficient documentation

## 2019-10-06 DIAGNOSIS — E860 Dehydration: Secondary | ICD-10-CM | POA: Insufficient documentation

## 2019-10-06 DIAGNOSIS — G43909 Migraine, unspecified, not intractable, without status migrainosus: Secondary | ICD-10-CM | POA: Diagnosis not present

## 2019-10-06 DIAGNOSIS — Z20822 Contact with and (suspected) exposure to covid-19: Secondary | ICD-10-CM | POA: Insufficient documentation

## 2019-10-06 DIAGNOSIS — Z833 Family history of diabetes mellitus: Secondary | ICD-10-CM | POA: Diagnosis not present

## 2019-10-06 DIAGNOSIS — Z8349 Family history of other endocrine, nutritional and metabolic diseases: Secondary | ICD-10-CM | POA: Diagnosis not present

## 2019-10-06 DIAGNOSIS — Z818 Family history of other mental and behavioral disorders: Secondary | ICD-10-CM | POA: Insufficient documentation

## 2019-10-06 DIAGNOSIS — Z8379 Family history of other diseases of the digestive system: Secondary | ICD-10-CM | POA: Diagnosis not present

## 2019-10-06 DIAGNOSIS — F329 Major depressive disorder, single episode, unspecified: Secondary | ICD-10-CM | POA: Diagnosis not present

## 2019-10-06 DIAGNOSIS — Z820 Family history of epilepsy and other diseases of the nervous system: Secondary | ICD-10-CM | POA: Diagnosis not present

## 2019-10-06 DIAGNOSIS — I880 Nonspecific mesenteric lymphadenitis: Secondary | ICD-10-CM | POA: Diagnosis not present

## 2019-10-06 DIAGNOSIS — R112 Nausea with vomiting, unspecified: Secondary | ICD-10-CM | POA: Diagnosis not present

## 2019-10-06 DIAGNOSIS — Z808 Family history of malignant neoplasm of other organs or systems: Secondary | ICD-10-CM | POA: Insufficient documentation

## 2019-10-06 DIAGNOSIS — Z79899 Other long term (current) drug therapy: Secondary | ICD-10-CM | POA: Diagnosis not present

## 2019-10-06 LAB — COMPREHENSIVE METABOLIC PANEL
ALT: 22 U/L (ref 0–44)
AST: 19 U/L (ref 15–41)
Albumin: 4.1 g/dL (ref 3.5–5.0)
Alkaline Phosphatase: 55 U/L (ref 38–126)
Anion gap: 12 (ref 5–15)
BUN: 13 mg/dL (ref 6–20)
CO2: 25 mmol/L (ref 22–32)
Calcium: 9.3 mg/dL (ref 8.9–10.3)
Chloride: 101 mmol/L (ref 98–111)
Creatinine, Ser: 1.38 mg/dL — ABNORMAL HIGH (ref 0.61–1.24)
GFR calc Af Amer: >60 mL/min (ref >60)
GFR calc non Af Amer: >60 mL/min (ref >60)
Glucose, Bld: 116 mg/dL — ABNORMAL HIGH (ref 70–99)
Potassium: 3.8 mmol/L (ref 3.5–5.1)
Sodium: 138 mmol/L (ref 135–145)
Total Bilirubin: 0.9 mg/dL (ref 0.3–1.2)
Total Protein: 7 g/dL (ref 6.5–8.1)

## 2019-10-06 LAB — CBC WITH DIFFERENTIAL/PLATELET
Abs Immature Granulocytes: 0.04 10*3/uL (ref 0.00–0.07)
Basophils Absolute: 0 10*3/uL (ref 0.0–0.1)
Basophils Relative: 0 %
Eosinophils Absolute: 0 10*3/uL (ref 0.0–0.5)
Eosinophils Relative: 0 %
HCT: 50.5 % (ref 39.0–52.0)
Hemoglobin: 16.9 g/dL (ref 13.0–17.0)
Immature Granulocytes: 1 %
Lymphocytes Relative: 15 %
Lymphs Abs: 1.2 10*3/uL (ref 0.7–4.0)
MCH: 28.7 pg (ref 26.0–34.0)
MCHC: 33.5 g/dL (ref 30.0–36.0)
MCV: 85.9 fL (ref 80.0–100.0)
Monocytes Absolute: 0.7 10*3/uL (ref 0.1–1.0)
Monocytes Relative: 8 %
Neutro Abs: 6.1 10*3/uL (ref 1.7–7.7)
Neutrophils Relative %: 76 %
Platelets: 253 10*3/uL (ref 150–400)
RBC: 5.88 MIL/uL — ABNORMAL HIGH (ref 4.22–5.81)
RDW: 12.3 % (ref 11.5–15.5)
WBC: 8 10*3/uL (ref 4.0–10.5)
nRBC: 0 % (ref 0.0–0.2)

## 2019-10-06 LAB — POC SARS CORONAVIRUS 2 AG -  ED: SARS Coronavirus 2 Ag: NEGATIVE

## 2019-10-06 LAB — LIPASE, BLOOD: Lipase: 38 U/L (ref 11–51)

## 2019-10-06 LAB — HIV ANTIBODY (ROUTINE TESTING W REFLEX): HIV Screen 4th Generation wRfx: NONREACTIVE

## 2019-10-06 MED ORDER — PROMETHAZINE HCL 25 MG/ML IJ SOLN
25.0000 mg | Freq: Four times a day (QID) | INTRAMUSCULAR | Status: DC | PRN
  Administered 2019-10-06: 25 mg via INTRAVENOUS
  Filled 2019-10-06: qty 1

## 2019-10-06 MED ORDER — ONDANSETRON 4 MG PO TBDP: 4.0000 mg | ORAL_TABLET | Freq: Three times a day (TID) | ORAL | 0 refills | Status: DC | PRN

## 2019-10-06 MED ORDER — ACETAMINOPHEN 325 MG PO TABS: 650.0000 mg | ORAL_TABLET | Freq: Four times a day (QID) | ORAL | Status: DC | PRN

## 2019-10-06 MED ORDER — DROPERIDOL 2.5 MG/ML IJ SOLN
1.2500 mg | Freq: Once | INTRAMUSCULAR | Status: AC
  Administered 2019-10-06: 1.25 mg via INTRAVENOUS
  Filled 2019-10-06: qty 2

## 2019-10-06 MED ORDER — IOHEXOL 300 MG/ML  SOLN
100.0000 mL | Freq: Once | INTRAMUSCULAR | Status: AC | PRN
  Administered 2019-10-06: 13:00:00 100 mL via INTRAVENOUS

## 2019-10-06 MED ORDER — IBUPROFEN 200 MG PO TABS: 600.0000 mg | ORAL_TABLET | Freq: Four times a day (QID) | ORAL | Status: DC | PRN

## 2019-10-06 MED ORDER — SODIUM CHLORIDE 0.9 % IV SOLN: INTRAVENOUS | Status: DC

## 2019-10-06 MED ORDER — PROMETHAZINE HCL 25 MG/ML IJ SOLN
25.0000 mg | Freq: Once | INTRAMUSCULAR | Status: AC
  Administered 2019-10-06: 25 mg via INTRAVENOUS
  Filled 2019-10-06: qty 1

## 2019-10-06 MED ORDER — SODIUM CHLORIDE 0.9 % IV BOLUS
1000.0000 mL | Freq: Once | INTRAVENOUS | Status: AC
  Administered 2019-10-06: 11:00:00 1000 mL via INTRAVENOUS

## 2019-10-06 MED ORDER — SODIUM CHLORIDE 0.9 % IV BOLUS
1000.0000 mL | Freq: Once | INTRAVENOUS | Status: AC
  Administered 2019-10-06: 1000 mL via INTRAVENOUS

## 2019-10-06 MED ORDER — ACETAMINOPHEN 650 MG RE SUPP: 650.0000 mg | Freq: Four times a day (QID) | RECTAL | Status: DC | PRN

## 2019-10-06 MED ORDER — ONDANSETRON HCL 4 MG/2ML IJ SOLN
4.0000 mg | Freq: Once | INTRAMUSCULAR | Status: AC
  Administered 2019-10-06: 4 mg via INTRAVENOUS
  Filled 2019-10-06: qty 2

## 2019-10-06 NOTE — H&P (Signed)
History and Physical    Andrew Castillo IFO:[id number] DOB: 04/30/1996 DOA: 10/06/2019  PCP: Waldon Merl, PA-C (Confirm with patient/family/NH records and if not entered, this has to be entered at [facility] point of entry) Patient coming from: Home  I have personally briefly reviewed patient's old medical records in [facility]  Chief Complaint: Nauseous, vomiting, abdominal pain, and diarrhea  HPI: Andrew Castillo is a 24 y.o. male with no significant past medical history presented with intractable nauseous vomit cramping like abdominal pain and watery diarrhea for 4 days.  He lives with his brother and father, both his brother and father had similar symptoms of nausea cramping abdominal pain diarrhea about 7 days ago, rather self-limiting, both of them recovered 3 days later.  Four days ago patient started experience nauseous vomit cramping like epigastric pain and loose bowel movement 3-4 times a day.  This is stomach content denied any bile or coffee-ground stuff.  And came to the ED was given prescription for Zofran and sent home, but his symptoms became worse and Zofran did not help.  He denied any fever, no loss of taste, no cough no Covid contact. ED Course: No tolerated p.o. diet challenge.  Review of Systems: As per HPI otherwise 10 point review of systems negative.    Past Medical History:  Diagnosis Date  . Childhood asthma   . Environmental allergies   . GERD (gastroesophageal reflux disease)   . Headache    migraines  . Seasonal allergic conjunctivitis     Past Surgical History:  Procedure Laterality Date  . Left Knee Menuiscus     "several surguries on same knee"  . NM ESOPHAGEAL REFLUX    . Right Shoulder Surgery    . TONSILLECTOMY     adenoids     reports that he has been smoking cigarettes. He has been smoking about 0.50 packs per day. He has never used smokeless tobacco. He reports current alcohol use. He reports current drug use. Drug:  Marijuana.  Allergies  Allergen Reactions  . Penicillins Hives    Family History  Problem Relation Age of Onset  . Hypertension Father   . Hyperlipidemia Father        Living  . Liver disease Father   . Fibromyalgia Mother   . Thyroid disease Mother        Living  . Lupus Mother   . Anxiety disorder Mother   . Clotting disorder Mother   . Crohn's disease Mother   . Bone cancer Paternal Grandmother   . Heart disease Paternal Grandfather   . Diabetes Paternal Grandfather   . Diabetes Maternal Grandmother   . Hypertension Maternal Grandmother   . Liver disease Maternal Grandmother   . Anxiety disorder Maternal Aunt   . Depression Maternal Aunt   . Lupus Maternal Aunt   . Diabetes Paternal Aunt   . Lupus Paternal Aunt   . Fibromyalgia Paternal Aunt   . Drug abuse Paternal Uncle   . ADD / ADHD Brother   . Anxiety disorder Sister   . Migraines Other        Various  . Seizures Brother      Prior to Admission medications   Medication Sig Start Date End Date Taking? Authorizing Provider  ondansetron (ZOFRAN ODT) 4 MG disintegrating tablet Take 1 tablet (4 mg total) by mouth every 8 (eight) hours as needed for nausea. 10/03/19  Yes Eber Hong, MD  nicotine (NICODERM CQ) 14 mg/24hr patch Place  1 patch (14 mg total) onto the skin daily. Patient not taking: Reported on 10/06/2019 06/24/19   Waldon Merl, PA-C  ondansetron (ZOFRAN ODT) 4 MG disintegrating tablet Take 1 tablet (4 mg total) by mouth every 8 (eight) hours as needed for nausea or vomiting. 10/06/19   Mannie Stabile, PA-C    Physical Exam: Vitals:   10/06/19 0948 10/06/19 0954 10/06/19 1000 10/06/19 1400  BP: (!) 143/88  (!) 143/88 120/68  Pulse: 60  60 89  Resp: (!) 22  16 16   Temp: 98.6 F (37 C)     TempSrc: Oral     SpO2: 100%  99% 98%  Weight:  88.5 kg    Height:  6' (1.829 m)      Constitutional: NAD, calm, comfortable Vitals:   10/06/19 0948 10/06/19 0954 10/06/19 1000 10/06/19 1400  BP:  (!) 143/88  (!) 143/88 120/68  Pulse: 60  60 89  Resp: (!) 22  16 16   Temp: 98.6 F (37 C)     TempSrc: Oral     SpO2: 100%  99% 98%  Weight:  88.5 kg    Height:  6' (1.829 m)     Eyes: PERRL, lids and conjunctivae normal ENMT: Mucous membranes are dry. Posterior pharynx clear of any exudate or lesions.Normal dentition.  Neck: normal, supple, no masses, no thyromegaly Respiratory: clear to auscultation bilaterally, no wheezing, no crackles. Normal respiratory effort. No accessory muscle use.  Cardiovascular: Regular rate and rhythm, no murmurs / rubs / gallops. No extremity edema. 2+ pedal pulses. No carotid bruits.  Abdomen: no tenderness, no masses palpated. No hepatosplenomegaly. Bowel sounds positive.  Musculoskeletal: no clubbing / cyanosis. No joint deformity upper and lower extremities. Good ROM, no contractures. Normal muscle tone.  Skin: no rashes, lesions, ulcers. No induration Neurologic: CN 2-12 grossly intact. Sensation intact, DTR normal. Strength 5/5 in all 4.  Psychiatric: Normal judgment and insight. Alert and oriented x 3. Normal mood.     Labs on Admission: I have personally reviewed following labs and imaging studies  CBC: Recent Labs  Lab 10/03/19 1852 10/06/19 1003  WBC 16.1* 8.0  NEUTROABS  --  6.1  HGB 16.7 16.9  HCT 49.9 50.5  MCV 86.8 85.9  PLT 274 253   Basic Metabolic Panel: Recent Labs  Lab 10/03/19 1852 10/06/19 1003  NA 138 138  K 4.1 3.8  CL 103 101  CO2 21* 25  GLUCOSE 149* 116*  BUN 15 13  CREATININE 1.12 1.38*  CALCIUM 9.5 9.3   GFR: Estimated Creatinine Clearance: 92.2 mL/min (A) (by C-G formula based on SCr of 1.38 mg/dL (H)). Liver Function Tests: Recent Labs  Lab 10/03/19 1852 10/06/19 1003  AST 19 19  ALT 22 22  ALKPHOS 58 55  BILITOT 1.3* 0.9  PROT 8.1 7.0  ALBUMIN 4.3 4.1   Recent Labs  Lab 10/03/19 1852 10/06/19 1003  LIPASE 24 38   No results for input(s): AMMONIA in the last 168 hours. Coagulation  Profile: No results for input(s): INR, PROTIME in the last 168 hours. Cardiac Enzymes: No results for input(s): CKTOTAL, CKMB, CKMBINDEX, TROPONINI in the last 168 hours. BNP (last 3 results) No results for input(s): PROBNP in the last 8760 hours. HbA1C: No results for input(s): HGBA1C in the last 72 hours. CBG: No results for input(s): GLUCAP in the last 168 hours. Lipid Profile: No results for input(s): CHOL, HDL, LDLCALC, TRIG, CHOLHDL, LDLDIRECT in the last 72 hours. Thyroid Function Tests:  No results for input(s): TSH, T4TOTAL, FREET4, T3FREE, THYROIDAB in the last 72 hours. Anemia Panel: No results for input(s): VITAMINB12, FOLATE, FERRITIN, TIBC, IRON, RETICCTPCT in the last 72 hours. Urine analysis:    Component Value Date/Time   COLORURINE YELLOW 02/19/2018 1602   APPEARANCEUR CLOUDY (A) 02/19/2018 1602   LABSPEC 1.016 02/19/2018 1602   PHURINE 5.0 02/19/2018 1602   GLUCOSEU NEGATIVE 02/19/2018 1602   GLUCOSEU NEGATIVE 05/04/2016 1052   HGBUR MODERATE (A) 02/19/2018 1602   BILIRUBINUR NEGATIVE 02/19/2018 1602   BILIRUBINUR negative 11/12/2017 1319   KETONESUR 5 (A) 02/19/2018 1602   PROTEINUR 30 (A) 02/19/2018 1602   UROBILINOGEN 0.2 11/12/2017 1319   UROBILINOGEN 1.0 05/04/2016 1052   NITRITE NEGATIVE 02/19/2018 1602   LEUKOCYTESUR NEGATIVE 02/19/2018 1602    Radiological Exams on Admission: CT ABDOMEN PELVIS W CONTRAST  Result Date: 10/06/2019 CLINICAL DATA:  Abdominal pain with vomiting and nausea EXAM: CT ABDOMEN AND PELVIS WITH CONTRAST TECHNIQUE: Multidetector CT imaging of the abdomen and pelvis was performed using the standard protocol following bolus administration of intravenous contrast. CONTRAST:  OMNIPAQUE IOHEXOL 300 MG/ML  SOLN COMPARISON:  September 25, 2018 FINDINGS: Lower chest: Lung bases are clear. Hepatobiliary: No focal liver lesions are evident. The gallbladder wall is not appreciably thickened. There is no biliary duct dilatation. Pancreas:  There is no pancreatic mass or inflammatory focus. Spleen: No splenic lesions are evident. Adrenals/Urinary Tract: Adrenals bilaterally appear unremarkable. Kidneys bilaterally show no evident mass or hydronephrosis on either side. There is no evident renal or ureteral calculus on either side. Urinary bladder is midline with wall thickness within normal limits. Stomach/Bowel: There is no appreciable bowel wall or mesenteric thickening. Note that colon is nearly empty. There is no evident bowel obstruction. Terminal ileum appears unremarkable. There is no evident free air or portal venous air. Vascular/Lymphatic: No abdominal aortic aneurysm. No vascular lesions are appreciable. By size criteria, there is no adenopathy in the abdomen or pelvis. There are subcentimeter inguinal lymph nodes as well as several subcentimeter lymph nodes in the right lower quadrant region. The largest of these lymph nodes in the right lower quadrant has a short axis diameter of 9 mm, upper normal in size. Reproductive: Prostate and seminal vesicles are normal in size and contour. No evident pelvic mass. Other: The appendix appears normal. No evident abscess or ascites in the abdomen or pelvis. Musculoskeletal: No blastic or lytic bone lesions evident. There is no intramuscular or abdominal wall lesions. IMPRESSION: 1. Several subcentimeter lymph nodes in the right lower quadrant. In the appropriate clinical setting, this finding could indicate a degree of mesenteric adenitis. No frank adenopathy in the abdomen or pelvis evident by size criteria. 2. No evident bowel obstruction. No abscess in the abdomen or pelvis. The appendix appears normal. 3. No evident renal or ureteral calculus. No hydronephrosis. Urinary bladder wall thickness is within normal limits. Electronically Signed   By: Bretta Bang III M.D.   On: 10/06/2019 13:08   DG Chest Portable 1 View  Result Date: 10/06/2019 CLINICAL DATA:  Shortness of breath EXAM: PORTABLE  CHEST 1 VIEW COMPARISON:  2017 FINDINGS: The heart size and mediastinal contours are within normal limits. Both lungs are clear. No pleural effusion. No pneumothorax. The visualized skeletal structures are unremarkable. IMPRESSION: No acute process in the chest. Electronically Signed   By: Guadlupe Spanish M.D.   On: 10/06/2019 11:03    EKG: None  Assessment/Plan Active Problems:   Enteritis  Acute Gastroenteritis,  probably viral, given no significant elevation of WBC count, and rather self-limiting character on his brother and father. IV fluid, patient report Finnigan in the ER worked for his feeling of nauseous, will continue Phenergan as needed. CT abdomen reassuring only showing mesenteric adenitis. Tell patient to stop marijuana for now. Likely can be discharged in 24 hours if symptoms improve.  AKI, from dehydration, IV fluid and recheck tomorrow.     DVT prophylaxis: SCD Code Status: Full Family Communication: None at bedside Disposition Plan: Home Consults called: None Admission status: Medsurg obs   Lequita Halt MD [facility] Pager [phone]  If 7PM-7AM, please contact night-coverage [email or web address] Password [facility]  10/06/2019, 4:03 PM

## 2019-10-06 NOTE — Plan of Care (Signed)
  Problem: Education: Goal: Knowledge of General Education information will improve Description: Including pain rating scale, medication(s)/side effects and non-pharmacologic comfort measures Outcome: Progressing   Problem: Activity: Goal: Risk for activity intolerance will decrease Outcome: Progressing   Problem: Nutrition: Goal: Adequate nutrition will be maintained Outcome: Progressing   

## 2019-10-06 NOTE — ED Provider Notes (Signed)
[facility] EMERGENCY DEPARTMENT Provider Note   CSN: [id number] Arrival date & time: 10/06/19  0350     History Chief Complaint  Patient presents with  . Abdominal Pain  . Emesis    Andrew Castillo is a 24 y.o. male with a past medical history significant For GERD and seasonal allergies who presents to the ED due to consistent nausea, vomiting, and diarrhea x4 days associated with abdominal pain and generalized weakness.  Chart reviewed.  Patient was seen on 10/03/2019 for the same complaint and diagnosed with gastroenteritis.  Patient states he was prescribed Zofran, but the pharmacy never received the order.  He has tried TheraFlu with no relief.  Patient endorses roughly 10 episodes of nonbloody, nonbilious emesis and 4 episodes of nonbloody diarrhea daily for the past 4 days.  Patient notes he is unable to tolerate any p.o. at this time.  States he had numerous family members that were sick with similar symptoms a few days ago with improvement in symptoms within 48 hours.  Patient denies fever, but admits to chills.  Patient also endorses shortness of breath at rest.  He has a history of asthma.  Patient denies Covid exposures.  Patient denies cough, sore throat, and chest pain.       Past Medical History:  Diagnosis Date  . Childhood asthma   . Environmental allergies   . GERD (gastroesophageal reflux disease)   . Headache    migraines  . Seasonal allergic conjunctivitis     Patient Active Problem List   Diagnosis Date Noted  . Enteritis 10/06/2019  . Tobacco use disorder 06/24/2019  . Intractable nausea and vomiting 05/02/2016  . Esophageal reflux 05/02/2016  . Visit for preventive health examination 04/30/2016  . Seizure-like activity ([place]) 04/30/2016  . Anxiety and depression 11/21/2015  . Gastroesophageal reflux disease with esophagitis 07/15/2015  . Inattention 07/15/2015  . RHINITIS, ALLERGIC 11/14/2006  . ASTHMA, UNSPECIFIED 11/14/2006  .  ECZEMA, ATOPIC DERMATITIS 11/14/2006    Past Surgical History:  Procedure Laterality Date  . Left Knee Menuiscus     "several surguries on same knee"  . NM ESOPHAGEAL REFLUX    . Right Shoulder Surgery    . TONSILLECTOMY     adenoids       Family History  Problem Relation Age of Onset  . Hypertension Father   . Hyperlipidemia Father        Living  . Liver disease Father   . Fibromyalgia Mother   . Thyroid disease Mother        Living  . Lupus Mother   . Anxiety disorder Mother   . Clotting disorder Mother   . Crohn's disease Mother   . Bone cancer Paternal Grandmother   . Heart disease Paternal Grandfather   . Diabetes Paternal Grandfather   . Diabetes Maternal Grandmother   . Hypertension Maternal Grandmother   . Liver disease Maternal Grandmother   . Anxiety disorder Maternal Aunt   . Depression Maternal Aunt   . Lupus Maternal Aunt   . Diabetes Paternal Aunt   . Lupus Paternal Aunt   . Fibromyalgia Paternal Aunt   . Drug abuse Paternal Uncle   . ADD / ADHD Brother   . Anxiety disorder Sister   . Migraines Other        Various  . Seizures Brother     Social History   Tobacco Use  . Smoking status: Current Every Day Smoker    Packs/day: 0.50  Types: Cigarettes    Last attempt to quit: 04/17/2016    Years since quitting: 3.4  . Smokeless tobacco: Never Used  . Tobacco comment: 1 pack every 2-3 days  Substance Use Topics  . Alcohol use: Yes    Alcohol/week: 0.0 standard drinks    Comment: socially  . Drug use: Yes    Types: Marijuana    Comment: daily    Home Medications Prior to Admission medications   Medication Sig Start Date End Date Taking? Authorizing Provider  ondansetron (ZOFRAN ODT) 4 MG disintegrating tablet Take 1 tablet (4 mg total) by mouth every 8 (eight) hours as needed for nausea. 10/03/19  Yes Eber Hong, MD  nicotine (NICODERM CQ) 14 mg/24hr patch Place 1 patch (14 mg total) onto the skin daily. Patient not taking: Reported on  10/06/2019 06/24/19   Waldon Merl, PA-C  ondansetron (ZOFRAN ODT) 4 MG disintegrating tablet Take 1 tablet (4 mg total) by mouth every 8 (eight) hours as needed for nausea or vomiting. 10/06/19   Mannie Stabile, PA-C    Allergies    Penicillins  Review of Systems   Review of Systems  Constitutional: Positive for activity change, appetite change, chills and fatigue. Negative for fever.  HENT: Negative for sore throat.   Respiratory: Positive for shortness of breath. Negative for cough.   Gastrointestinal: Positive for abdominal pain (generalized), diarrhea, nausea and vomiting. Negative for abdominal distention, anal bleeding and blood in stool.  Genitourinary: Negative for dysuria.  Neurological: Positive for weakness.  All other systems reviewed and are negative.   Physical Exam Updated Vital Signs BP 132/69 (BP Location: Left Arm)   Pulse 74   Temp 98.3 F (36.8 C) (Oral)   Resp 17   Ht 6' (1.829 m)   Wt 88.5 kg   SpO2 98%   BMI 26.45 kg/m   Physical Exam Vitals and nursing note reviewed.  Constitutional:      General: He is not in acute distress.    Appearance: He is ill-appearing.     Comments: Actively vomiting in emesis bag during initial exam. Appears pale  HENT:     Head: Normocephalic.     Mouth/Throat:     Comments: Dry mucus membranes Eyes:     Pupils: Pupils are equal, round, and reactive to light.  Cardiovascular:     Rate and Rhythm: Normal rate and regular rhythm.     Pulses: Normal pulses.     Heart sounds: Normal heart sounds. No murmur. No friction rub. No gallop.   Pulmonary:     Effort: Pulmonary effort is normal.     Breath sounds: Wheezing present.     Comments: Mild wheeze heard throughout.  Abdominal:     General: Abdomen is flat. Bowel sounds are normal. There is no distension.     Palpations: Abdomen is soft.     Tenderness: There is abdominal tenderness. There is no guarding or rebound.     Comments: Generalized abdominal  tenderness, most significant in epigastric region. Negative murphy's sign. No tenderness at McBurney's point. No rebound or guarding.   Musculoskeletal:     Cervical back: Neck supple.     Comments: Able to move all 4 extremities without difficulty. No lower extremity edema.   Skin:    General: Skin is warm and dry.     Coloration: Skin is pale.  Neurological:     General: No focal deficit present.     Mental Status: He is alert.  ED Results / Procedures / Treatments   Labs (all labs ordered are listed, but only abnormal results are displayed) Labs Reviewed  CBC WITH DIFFERENTIAL/PLATELET - Abnormal; Notable for the following components:      Result Value   RBC 5.88 (*)    All other components within normal limits  COMPREHENSIVE METABOLIC PANEL - Abnormal; Notable for the following components:   Glucose, Bld 116 (*)    Creatinine, Ser 1.38 (*)    All other components within normal limits  NOVEL CORONAVIRUS, NAA (HOSP ORDER, SEND-OUT TO REF LAB; TAT 18-24 HRS)  LIPASE, BLOOD  HIV ANTIBODY (ROUTINE TESTING W REFLEX)  BASIC METABOLIC PANEL  POC SARS CORONAVIRUS 2 AG -  ED    EKG EKG Interpretation  Date/Time:  Tuesday October 06 2019 12:18:09 EST Ventricular Rate:  77 PR Interval:    QRS Duration: 92 QT Interval:  390 QTC Calculation: 442 R Axis:   62 Text Interpretation: Sinus rhythm No STEMI Confirmed by Alvester Chourifan, Matthew [phone]([id number]) on 10/06/2019 12:25:36 PM   Radiology CT ABDOMEN PELVIS W CONTRAST  Result Date: 10/06/2019 CLINICAL DATA:  Abdominal pain with vomiting and nausea EXAM: CT ABDOMEN AND PELVIS WITH CONTRAST TECHNIQUE: Multidetector CT imaging of the abdomen and pelvis was performed using the standard protocol following bolus administration of intravenous contrast. CONTRAST:  100mL OMNIPAQUE IOHEXOL 300 MG/ML  SOLN COMPARISON:  September 25, 2018 FINDINGS: Lower chest: Lung bases are clear. Hepatobiliary: No focal liver lesions are evident. The gallbladder wall is  not appreciably thickened. There is no biliary duct dilatation. Pancreas: There is no pancreatic mass or inflammatory focus. Spleen: No splenic lesions are evident. Adrenals/Urinary Tract: Adrenals bilaterally appear unremarkable. Kidneys bilaterally show no evident mass or hydronephrosis on either side. There is no evident renal or ureteral calculus on either side. Urinary bladder is midline with wall thickness within normal limits. Stomach/Bowel: There is no appreciable bowel wall or mesenteric thickening. Note that colon is nearly empty. There is no evident bowel obstruction. Terminal ileum appears unremarkable. There is no evident free air or portal venous air. Vascular/Lymphatic: No abdominal aortic aneurysm. No vascular lesions are appreciable. By size criteria, there is no adenopathy in the abdomen or pelvis. There are subcentimeter inguinal lymph nodes as well as several subcentimeter lymph nodes in the right lower quadrant region. The largest of these lymph nodes in the right lower quadrant has a short axis diameter of 9 mm, upper normal in size. Reproductive: Prostate and seminal vesicles are normal in size and contour. No evident pelvic mass. Other: The appendix appears normal. No evident abscess or ascites in the abdomen or pelvis. Musculoskeletal: No blastic or lytic bone lesions evident. There is no intramuscular or abdominal wall lesions. IMPRESSION: 1. Several subcentimeter lymph nodes in the right lower quadrant. In the appropriate clinical setting, this finding could indicate a degree of mesenteric adenitis. No frank adenopathy in the abdomen or pelvis evident by size criteria. 2. No evident bowel obstruction. No abscess in the abdomen or pelvis. The appendix appears normal. 3. No evident renal or ureteral calculus. No hydronephrosis. Urinary bladder wall thickness is within normal limits. Electronically Signed   By: Bretta BangWilliam  Woodruff III M.D.   On: 10/06/2019 13:08   DG Chest Portable 1  View  Result Date: 10/06/2019 CLINICAL DATA:  Shortness of breath EXAM: PORTABLE CHEST 1 VIEW COMPARISON:  2017 FINDINGS: The heart size and mediastinal contours are within normal limits. Both lungs are clear. No pleural effusion. No pneumothorax. The visualized  skeletal structures are unremarkable. IMPRESSION: No acute process in the chest. Electronically Signed   By: Guadlupe Spanish M.D.   On: 10/06/2019 11:03    Procedures Procedures (including critical care time)  Medications Ordered in ED Medications  0.9 %  sodium chloride infusion ( Intravenous New Bag/Given 10/06/19 1831)  acetaminophen (TYLENOL) tablet 650 mg (has no administration in time range)    Or  acetaminophen (TYLENOL) suppository 650 mg (has no administration in time range)  ibuprofen (ADVIL) tablet 600 mg (has no administration in time range)  promethazine (PHENERGAN) injection 25 mg (25 mg Intravenous Given 10/06/19 2015)  ondansetron (ZOFRAN) injection 4 mg (4 mg Intravenous Given 10/06/19 1022)  sodium chloride 0.9 % bolus 1,000 mL (0 mLs Intravenous Stopped 10/06/19 1112)  sodium chloride 0.9 % bolus 1,000 mL (0 mLs Intravenous Stopped 10/06/19 1217)  promethazine (PHENERGAN) injection 25 mg (25 mg Intravenous Given 10/06/19 1137)  droperidol (INAPSINE) 2.5 MG/ML injection 1.25 mg (1.25 mg Intravenous Given 10/06/19 1326)  iohexol (OMNIPAQUE) 300 MG/ML solution 100 mL (100 mLs Intravenous Contrast Given 10/06/19 1301)  sodium chloride 0.9 % bolus 1,000 mL (0 mLs Intravenous Stopped 10/06/19 1740)    ED Course  I have reviewed the triage vital signs and the nursing notes.  Pertinent labs & imaging results that were available during my care of the patient were reviewed by me and considered in my medical decision making (see chart for details).  Clinical Course as of Oct 06 2031  Tue Oct 06, 2019  1158 Reassessed patient at bedside. Patient still vomiting in emesis bag after Zofran and Phenergan. Abdomen soft, non-distended  with continued epigastric pain. Will obtain CT abdomen to rule out any intraabdominal etiology.    [CA]  1207 After further questioning, patient admits to daily marijuana use. Suspect intractable emesis could be related to cannabis hyperemesis syndrome. EKG checked with no Qtc prolongation. Will give Droperidol and reassess patient.    [CA]  1340 Reassessed patient. He feels much better after the droperidol with no more emesis. Will give 1 more liter of IVFs.   [CA]  1448 Reassessed patient. Abdomen soft, non-distended with decreased tenderness in epigastric region. Patient has not had any episodes of emesis since I last checked on him. Will do po challenge and if he can tolerate po will discharge home.    [CA]  1450 Reassessed patient after po challenge and he was unable to tolerate it and had a few episodes of emesis. Will consult hospitalist for admission due to intractable vomiting.    [CA]  1528 Spoke to Dr. Chipper Herb who agrees to evaluate patient and admit patient for further treatment   [CA]  1744 Pt seen by myself as well as PA provider.  Briefly a 24 yo previously healthy male presenting with ongoing abdominal pain, nausea and vomiting for several days.  This is his second ED visit for these complaints.  His CT here was notable for lymphadenitis, though the source if not entirely clear.  On exam he appears tired, has vomited, and has generalized abdominal tenderness (suspect from his vomiting) without distension or guarding.  No evidence of acute infection or sepsis based on workup here.  Unclear if this is a viral syndrome or if there is another underlying cause, but we will admit for inability to tolerate PO and furher monitoring.   [MT]    Clinical Course User Index [CA] Mannie Stabile, PA-C [MT] Renaye Rakers Kermit Balo, MD   MDM Rules/Calculators/A&P  24 year old male presents to the ED due to nausea, vomiting, diarrhea x4 days.  He was seen in the ED on 10/03/2019 for  the same complaint and diagnosed with gastroenteritis.  Patient denies worsening of symptoms, but admits he is still unable to tolerate p.o.  Patient denies fever, but admits to chills.  Patient is afebrile, not tachycardic or hypoxic.  Patient is borderline tachypneic at 22.  Patient is not hypotensive.  Patient in no acute distress, but appears ill on exam.  He is actively vomiting in emesis bag during initial evaluation.  Abdomen soft, nondistended with generalized tenderness most significant in the epigastric region.  Negative Murphy sign.  No tenderness at McBurney's point.  No rebound or guarding.  Suspect symptoms are related to gastroenteritis vs. Pancreatitis vs. COVID. Will obtain abdominal labs, COVID test, and CXR. IVFs and Zofran. Will hold off on CT at this point to see if there is improvement in symptoms with IVFs and zofran then reassess.   CBC reassuring with no leukocytosis.  CMP reassuring with mild elevation in creatinine at 1.38 likely due to dehydration and mild hyperglycemia at 116, but otherwise unremarkable.  Lipase normal at 38.  Doubt pancreatitis.  Rapid Covid test negative.  Will send for PCR test. Chest x-ray personally reviewed which is negative for signs of pneumonia.  EKG personally reviewed which demonstrates normal sinus rhythm with no signs of ischemia.  After a round of Zofran and Phenergan, patient continued to vomit.  See notes above.  CT scan ordered to rule out any intra-abdominal abnormalities.  CT scan personally reviewed which demonstrates: 1. Several subcentimeter lymph nodes in the right lower quadrant. In  the appropriate clinical setting, this finding could indicate a  degree of mesenteric adenitis. No frank adenopathy in the abdomen or  pelvis evident by size criteria.    2. No evident bowel obstruction. No abscess in the abdomen or  pelvis. The appendix appears normal.    3. No evident renal or ureteral calculus. No hydronephrosis. Urinary  bladder  wall thickness is within normal limits.     After 3 medications, patient has had no more episodes of emesis. See notes above. Patient does admit to daily marijuana usage. Intractable vomiting could be due to cannabinoid hyperemesis syndrome. Patient failed po challenge. Spoke to Dr. Chipper Herb who agrees to admit patient for further treatment.  Final Clinical Impression(s) / ED Diagnoses Final diagnoses:  Intractable nausea and vomiting    Rx / DC Orders ED Discharge Orders         Ordered    ondansetron (ZOFRAN ODT) 4 MG disintegrating tablet  Every 8 hours PRN,   Status:  Discontinued     10/06/19 1451    ondansetron (ZOFRAN ODT) 4 MG disintegrating tablet  Every 8 hours PRN     10/06/19 1455           Jesusita Oka 10/06/19 2033    Terald Sleeper, MD 10/07/19 [phone]

## 2019-10-06 NOTE — Discharge Instructions (Addendum)
Viral Gastroenteritis, Adult  Viral gastroenteritis is also known as the stomach flu. This condition may affect your stomach, small intestine, and large intestine. It can cause sudden watery diarrhea, fever, and vomiting. This condition is caused by many different viruses. These viruses can be passed from person to person very easily (are contagious). Diarrhea and vomiting can make you feel weak and cause you to become dehydrated. You may not be able to keep fluids down. Dehydration can make you tired and thirsty, cause you to have a dry mouth, and decrease how often you urinate. It is important to replace the fluids that you lose from diarrhea and vomiting. What are the causes? Gastroenteritis is caused by many viruses, including rotavirus and norovirus. Norovirus is the most common cause in adults. You can get sick after being exposed to the viruses from other people. You can also get sick by:  Eating food, drinking water, or touching a surface contaminated with one of these viruses.  Sharing utensils or other personal items with an infected person. What increases the risk? You are more likely to develop this condition if you:  Have a weak body defense system (immune system).  Live with one or more children who are younger than 2 years old.  Live in a nursing home.  Travel on cruise ships. What are the signs or symptoms? Symptoms of this condition start suddenly 1-3 days after exposure to a virus. Symptoms may last for a few days or for as long as a week. Common symptoms include watery diarrhea and vomiting. Other symptoms include:  Fever.  Headache.  Fatigue.  Pain in the abdomen.  Chills.  Weakness.  Nausea.  Muscle aches.  Loss of appetite. How is this diagnosed? This condition is diagnosed with a medical history and physical exam. You may also have a stool test to check for viruses or other infections. How is this treated? This condition typically goes away on its  own. The focus of treatment is to prevent dehydration and restore lost fluids (rehydration). This condition may be treated with:  An oral rehydration solution (ORS) to replace important salts and minerals (electrolytes) in your body. Take this if told by your health care provider. This is a drink that is sold at pharmacies and retail stores.  Medicines to help with your symptoms.  Probiotic supplements to reduce symptoms of diarrhea.  Fluids given through an IV, if dehydration is severe. Older adults and people with other diseases or a weak immune system are at higher risk for dehydration. Follow these instructions at home:  Eating and drinking   Take an ORS as told by your health care provider.  Drink clear fluids in small amounts as you are able. Clear fluids include: ? Water. ? Ice chips. ? Diluted fruit juice. ? Low-calorie sports drinks.  Drink enough fluid to keep your urine pale yellow.  Eat small amounts of healthy foods every 3-4 hours as you are able. This may include whole grains, fruits, vegetables, lean meats, and yogurt.  Avoid fluids that contain a lot of sugar or caffeine, such as energy drinks, sports drinks, and soda.  Avoid spicy or fatty foods.  Avoid alcohol. General instructions  Wash your hands often, especially after having diarrhea or vomiting. If soap and water are not available, use hand sanitizer.  Make sure that all people in your household wash their hands well and often.  Take over-the-counter and prescription medicines only as told by your health care provider.  Rest at   home while you recover.  Watch your condition for any changes.  Take a warm bath to relieve any burning or pain from frequent diarrhea episodes.  Keep all follow-up visits as told by your health care provider. This is important. Contact a health care provider if you:  Cannot keep fluids down.  Have symptoms that get worse.  Have new symptoms.  Feel light-headed or  dizzy.  Have muscle cramps. Get help right away if you:  Have chest pain.  Feel extremely weak or you faint.  See blood in your vomit.  Have vomit that looks like coffee grounds.  Have bloody or black stools or stools that look like tar.  Have a severe headache, a stiff neck, or both.  Have a rash.  Have severe pain, cramping, or bloating in your abdomen.  Have trouble breathing or you are breathing very quickly.  Have a fast heartbeat.  Have skin that feels cold and clammy.  Feel confused.  Have pain when you urinate.  Have signs of dehydration, such as: ? Dark urine, very little urine, or no urine. ? Cracked lips. ? Dry mouth. ? Sunken eyes. ? Sleepiness. ? Weakness. Summary  Viral gastroenteritis is also known as the stomach flu. It can cause sudden watery diarrhea, fever, and vomiting.  This condition can be passed from person to person very easily (is contagious).  Take an ORS if told by your health care provider. This is a drink that is sold at pharmacies and retail stores.  Wash your hands often, especially after having diarrhea or vomiting. If soap and water are not available, use hand sanitizer. This information is not intended to replace advice given to you by your health care provider. Make sure you discuss any questions you have with your health care provider. Document Revised: 02/20/2019 Document Reviewed: 07/09/2018 Elsevier Patient Education  2020 [employer]  

## 2019-10-06 NOTE — Telephone Encounter (Signed)
Pt father called regarding not pharmacy not receiving Rx via e-scribe.  EDCM called in Rx and gave verbal order as written.

## 2019-10-06 NOTE — ED Triage Notes (Addendum)
Pt reports N/V/D for x4 days. Endorses abd pain and gen weakness. Says he has not been able to keep anything down. Denies fever

## 2019-10-07 DIAGNOSIS — K529 Noninfective gastroenteritis and colitis, unspecified: Secondary | ICD-10-CM | POA: Diagnosis not present

## 2019-10-07 LAB — NOVEL CORONAVIRUS, NAA (HOSP ORDER, SEND-OUT TO REF LAB; TAT 18-24 HRS): SARS-CoV-2, NAA: NOT DETECTED

## 2019-10-07 LAB — BASIC METABOLIC PANEL
Anion gap: 9 (ref 5–15)
BUN: 8 mg/dL (ref 6–20)
CO2: 23 mmol/L (ref 22–32)
Calcium: 8.4 mg/dL — ABNORMAL LOW (ref 8.9–10.3)
Chloride: 107 mmol/L (ref 98–111)
Creatinine, Ser: 1.01 mg/dL (ref 0.61–1.24)
GFR calc Af Amer: >60 mL/min (ref >60)
GFR calc non Af Amer: >60 mL/min (ref >60)
Glucose, Bld: 103 mg/dL — ABNORMAL HIGH (ref 70–99)
Potassium: 3.5 mmol/L (ref 3.5–5.1)
Sodium: 139 mmol/L (ref 135–145)

## 2019-10-07 MED ORDER — ONDANSETRON 4 MG PO TBDP: 4.0000 mg | ORAL_TABLET | Freq: Three times a day (TID) | ORAL | 0 refills | Status: DC | PRN

## 2019-10-07 NOTE — Progress Notes (Signed)
Andrew Castillo to be D/C'd home per MD order.  Discussed prescriptions and follow up appointments with the patient. Prescriptions sent to patient's preferred pharmacy, medication list explained in detail. Pt verbalized understanding.  Allergies as of 10/07/2019      Reactions   Penicillins Hives      Medication List    TAKE these medications   nicotine 14 mg/24hr patch Commonly known as: Nicoderm CQ Place 1 patch (14 mg total) onto the skin daily.   ondansetron 4 MG disintegrating tablet Commonly known as: Zofran ODT Take 1 tablet (4 mg total) by mouth every 8 (eight) hours as needed for nausea. What changed: Another medication with the same name was added. Make sure you understand how and when to take each.   ondansetron 4 MG disintegrating tablet Commonly known as: Zofran ODT Take 1 tablet (4 mg total) by mouth every 8 (eight) hours as needed for nausea or vomiting. What changed: You were already taking a medication with the same name, and this prescription was added. Make sure you understand how and when to take each.   ondansetron 4 MG disintegrating tablet Commonly known as: ZOFRAN-ODT Take 1 tablet (4 mg total) by mouth every 8 (eight) hours as needed for nausea or vomiting. What changed: You were already taking a medication with the same name, and this prescription was added. Make sure you understand how and when to take each.       Vitals:   10/07/19 0451 10/07/19 0805  BP: 128/78 134/80  Pulse: 77 80  Resp: 16 17  Temp: 98.9 F (37.2 C) 98.3 F (36.8 C)  SpO2: 99% 100%    Skin clean, dry and intact without evidence of skin break down, no evidence of skin tears noted. IV catheter discontinued intact. Site without signs and symptoms of complications. Dressing and pressure applied. Pt denies pain at this time. No complaints noted.  An After Visit Summary was printed and given to the patient. Patient escorted via WC, and D/C home via private auto.  Andrew Castillo  [facility] 10/07/2019 10:51 AM

## 2019-10-07 NOTE — Discharge Summary (Signed)
Physician Discharge Summary  Andrew Castillo MWU:[id number] DOB: [birth year] DOA: 10/06/2019  PCP: Waldon Merl, PA-C  Admit date: 10/06/2019 Discharge date: 10/07/2019  Admitted From: Home Disposition: Home  Recommendations for Outpatient Follow-up:  1. Follow up with PCP in 1-2 weeks 2. Please obtain BMP/CBC in one week 3. Please follow up on the following pending results:  Home Health: None Equipment/Devices: None  Discharge Condition: Stable CODE STATUS: Full code Diet recommendation: Full liquid diet/soft diet  Subjective: Seen and examined.  Feels much better.  No more nausea vomiting or any abdominal pain or diarrhea.  HPI: Andrew Castillo is a 24 y.o. male with no significant past medical history presented with intractable nauseous vomit cramping like abdominal pain and watery diarrhea for 4 days.  He lives with his brother and father, both his brother and father had similar symptoms of nausea cramping abdominal pain diarrhea about 7 days ago, rather self-limiting, both of them recovered 3 days later.  Four days ago patient started experience nauseous vomit cramping like epigastric pain and loose bowel movement 3-4 times a day.  This is stomach content denied any bile or coffee-ground stuff.  And came to the ED was given prescription for Zofran and sent home, but his symptoms became worse and Zofran did not help.  He denied any fever, no loss of taste, no cough no Covid contact. ED Course: No tolerated p.o. diet challenge.   Brief/Interim Summary: Patient was admitted for gastroenteritis based on the CT scan and the symptoms.  He was kept on clear liquid diet which he tolerated well.  He was then advanced to full liquid diet which he tolerated well as well this point.  His last vomiting was 3 AM this morning.  No more nausea now.  His last diarrhea/bowel movement was 24 hours ago.  No more abdominal pain.  I offered patient to stay until lunch so he can tolerate soft diet and  then likely go home however was adamant to discharge him now and he took the responsibility to monitor his diet and advance as tolerated.  He would like to stay on full liquid diet for another 24 hours.  Per his request, he is being discharged home.  Discharge Diagnoses:  Active Problems:   Intractable nausea and vomiting   Enteritis    Discharge Instructions  Discharge Instructions    Discharge patient   Complete by: As directed    Discharge disposition: 01-Home or Self Care   Discharge patient date: 10/07/2019     Allergies as of 10/07/2019      Reactions   Penicillins Hives      Medication List    TAKE these medications   nicotine 14 mg/24hr patch Commonly known as: Nicoderm CQ Place 1 patch (14 mg total) onto the skin daily.   ondansetron 4 MG disintegrating tablet Commonly known as: Zofran ODT Take 1 tablet (4 mg total) by mouth every 8 (eight) hours as needed for nausea. What changed: Another medication with the same name was added. Make sure you understand how and when to take each.   ondansetron 4 MG disintegrating tablet Commonly known as: Zofran ODT Take 1 tablet (4 mg total) by mouth every 8 (eight) hours as needed for nausea or vomiting. What changed: You were already taking a medication with the same name, and this prescription was added. Make sure you understand how and when to take each.      Follow-up Information    Waldon Merl,  PA-C Follow up in 1 week(s).   Specialty: Family Medicine Contact information: [address] [place] [address] [phone]          Allergies  Allergen Reactions  . Penicillins Hives    Consultations: None   Procedures/Studies: CT ABDOMEN PELVIS W CONTRAST  Result Date: 10/06/2019 CLINICAL DATA:  Abdominal pain with vomiting and nausea EXAM: CT ABDOMEN AND PELVIS WITH CONTRAST TECHNIQUE: Multidetector CT imaging of the abdomen and pelvis was performed using the standard protocol  following bolus administration of intravenous contrast. CONTRAST:  165mL OMNIPAQUE IOHEXOL 300 MG/ML  SOLN COMPARISON:  September 25, 2018 FINDINGS: Lower chest: Lung bases are clear. Hepatobiliary: No focal liver lesions are evident. The gallbladder wall is not appreciably thickened. There is no biliary duct dilatation. Pancreas: There is no pancreatic mass or inflammatory focus. Spleen: No splenic lesions are evident. Adrenals/Urinary Tract: Adrenals bilaterally appear unremarkable. Kidneys bilaterally show no evident mass or hydronephrosis on either side. There is no evident renal or ureteral calculus on either side. Urinary bladder is midline with wall thickness within normal limits. Stomach/Bowel: There is no appreciable bowel wall or mesenteric thickening. Note that colon is nearly empty. There is no evident bowel obstruction. Terminal ileum appears unremarkable. There is no evident free air or portal venous air. Vascular/Lymphatic: No abdominal aortic aneurysm. No vascular lesions are appreciable. By size criteria, there is no adenopathy in the abdomen or pelvis. There are subcentimeter inguinal lymph nodes as well as several subcentimeter lymph nodes in the right lower quadrant region. The largest of these lymph nodes in the right lower quadrant has a short axis diameter of 9 mm, upper normal in size. Reproductive: Prostate and seminal vesicles are normal in size and contour. No evident pelvic mass. Other: The appendix appears normal. No evident abscess or ascites in the abdomen or pelvis. Musculoskeletal: No blastic or lytic bone lesions evident. There is no intramuscular or abdominal wall lesions. IMPRESSION: 1. Several subcentimeter lymph nodes in the right lower quadrant. In the appropriate clinical setting, this finding could indicate a degree of mesenteric adenitis. No frank adenopathy in the abdomen or pelvis evident by size criteria. 2. No evident bowel obstruction. No abscess in the abdomen or pelvis.  The appendix appears normal. 3. No evident renal or ureteral calculus. No hydronephrosis. Urinary bladder wall thickness is within normal limits. Electronically Signed   By: Lowella Grip III M.D.   On: 10/06/2019 13:08   DG Chest Portable 1 View  Result Date: 10/06/2019 CLINICAL DATA:  Shortness of breath EXAM: PORTABLE CHEST 1 VIEW COMPARISON:  2017 FINDINGS: The heart size and mediastinal contours are within normal limits. Both lungs are clear. No pleural effusion. No pneumothorax. The visualized skeletal structures are unremarkable. IMPRESSION: No acute process in the chest. Electronically Signed   By: Macy Mis M.D.   On: 10/06/2019 11:03      Discharge Exam: Vitals:   10/07/19 0451 10/07/19 0805  BP: 128/78 134/80  Pulse: 77 80  Resp: 16 17  Temp: 98.9 F (37.2 C) 98.3 F (36.8 C)  SpO2: 99% 100%   Vitals:   10/06/19 1807 10/06/19 1959 10/07/19 0451 10/07/19 0805  BP: 140/77 132/69 128/78 134/80  Pulse: 65 74 77 80  Resp: (!) 24 17 16 17   Temp: 98.5 F (36.9 C) 98.3 F (36.8 C) 98.9 F (37.2 C) 98.3 F (36.8 C)  TempSrc: Oral Oral Oral Oral  SpO2: 100% 98% 99% 100%  Weight:  Height:        General: Pt is alert, awake, not in acute distress Cardiovascular: RRR, S1/S2 +, no rubs, no gallops Respiratory: CTA bilaterally, no wheezing, no rhonchi Abdominal: Soft, NT, ND, bowel sounds + Extremities: no edema, no cyanosis    The results of significant diagnostics from this hospitalization (including imaging, microbiology, ancillary and laboratory) are listed below for reference.     Microbiology: No results found for this or any previous visit (from the past 240 hour(s)).   Labs: BNP (last 3 results) No results for input(s): BNP in the last 8760 hours. Basic Metabolic Panel: Recent Labs  Lab 10/03/19 1852 10/06/19 1003 10/07/19 0226  NA 138 138 139  K 4.1 3.8 3.5  CL 103 101 107  CO2 21* 25 23  GLUCOSE 149* 116* 103*  BUN 15 13 8    CREATININE 1.12 1.38* 1.01  CALCIUM 9.5 9.3 8.4*   Liver Function Tests: Recent Labs  Lab 10/03/19 1852 10/06/19 1003  AST 19 19  ALT 22 22  ALKPHOS 58 55  BILITOT 1.3* 0.9  PROT 8.1 7.0  ALBUMIN 4.3 4.1   Recent Labs  Lab 10/03/19 1852 10/06/19 1003  LIPASE 24 38   No results for input(s): AMMONIA in the last 168 hours. CBC: Recent Labs  Lab 10/03/19 1852 10/06/19 1003  WBC 16.1* 8.0  NEUTROABS  --  6.1  HGB 16.7 16.9  HCT 49.9 50.5  MCV 86.8 85.9  PLT 274 253   Cardiac Enzymes: No results for input(s): CKTOTAL, CKMB, CKMBINDEX, TROPONINI in the last 168 hours. BNP: Invalid input(s): POCBNP CBG: No results for input(s): GLUCAP in the last 168 hours. D-Dimer No results for input(s): DDIMER in the last 72 hours. Hgb A1c No results for input(s): HGBA1C in the last 72 hours. Lipid Profile No results for input(s): CHOL, HDL, LDLCALC, TRIG, CHOLHDL, LDLDIRECT in the last 72 hours. Thyroid function studies No results for input(s): TSH, T4TOTAL, T3FREE, THYROIDAB in the last 72 hours.  Invalid input(s): FREET3 Anemia work up No results for input(s): VITAMINB12, FOLATE, FERRITIN, TIBC, IRON, RETICCTPCT in the last 72 hours. Urinalysis    Component Value Date/Time   COLORURINE YELLOW 02/19/2018 1602   APPEARANCEUR CLOUDY (A) 02/19/2018 1602   LABSPEC 1.016 02/19/2018 1602   PHURINE 5.0 02/19/2018 1602   GLUCOSEU NEGATIVE 02/19/2018 1602   GLUCOSEU NEGATIVE 05/04/2016 1052   HGBUR MODERATE (A) 02/19/2018 1602   BILIRUBINUR NEGATIVE 02/19/2018 1602   BILIRUBINUR negative 11/12/2017 1319   KETONESUR 5 (A) 02/19/2018 1602   PROTEINUR 30 (A) 02/19/2018 1602   UROBILINOGEN 0.2 11/12/2017 1319   UROBILINOGEN 1.0 05/04/2016 1052   NITRITE NEGATIVE 02/19/2018 1602   LEUKOCYTESUR NEGATIVE 02/19/2018 1602   Sepsis Labs Invalid input(s): PROCALCITONIN,  WBC,  LACTICIDVEN Microbiology No results found for this or any previous visit (from the past 240  hour(s)).   Time coordinating discharge: Over 30 minutes  SIGNED:   04/21/2018, MD  [facility] 10/07/2019, 9:33 AM  If 7PM-7AM, please contact night-coverage [email or web address] Password [email or web address]

## 2019-10-08 ENCOUNTER — Other Ambulatory Visit: Payer: Self-pay

## 2019-10-08 ENCOUNTER — Encounter (HOSPITAL_COMMUNITY): Payer: Self-pay | Admitting: Emergency Medicine

## 2019-10-08 ENCOUNTER — Telehealth: Payer: Self-pay

## 2019-10-08 ENCOUNTER — Emergency Department (HOSPITAL_COMMUNITY)
Admission: EM | Admit: 2019-10-08 | Discharge: 2019-10-08 | Disposition: A | Discharge status: Home / Self Care | Payer: Managed Care, Other (non HMO) | Attending: Emergency Medicine | Admitting: Emergency Medicine

## 2019-10-08 DIAGNOSIS — F17210 Nicotine dependence, cigarettes, uncomplicated: Secondary | ICD-10-CM | POA: Diagnosis not present

## 2019-10-08 DIAGNOSIS — J45909 Unspecified asthma, uncomplicated: Secondary | ICD-10-CM | POA: Diagnosis not present

## 2019-10-08 DIAGNOSIS — R112 Nausea with vomiting, unspecified: Secondary | ICD-10-CM | POA: Insufficient documentation

## 2019-10-08 DIAGNOSIS — R197 Diarrhea, unspecified: Secondary | ICD-10-CM | POA: Insufficient documentation

## 2019-10-08 DIAGNOSIS — R109 Unspecified abdominal pain: Secondary | ICD-10-CM | POA: Diagnosis not present

## 2019-10-08 LAB — COMPREHENSIVE METABOLIC PANEL
ALT: 23 U/L (ref 0–44)
AST: 25 U/L (ref 15–41)
Albumin: 4 g/dL (ref 3.5–5.0)
Alkaline Phosphatase: 57 U/L (ref 38–126)
Anion gap: 8 (ref 5–15)
BUN: 8 mg/dL (ref 6–20)
CO2: 26 mmol/L (ref 22–32)
Calcium: 9 mg/dL (ref 8.9–10.3)
Chloride: 104 mmol/L (ref 98–111)
Creatinine, Ser: 1.01 mg/dL (ref 0.61–1.24)
GFR calc Af Amer: >60 mL/min (ref >60)
GFR calc non Af Amer: >60 mL/min (ref >60)
Glucose, Bld: 110 mg/dL — ABNORMAL HIGH (ref 70–99)
Potassium: 3.5 mmol/L (ref 3.5–5.1)
Sodium: 138 mmol/L (ref 135–145)
Total Bilirubin: 0.2 mg/dL — ABNORMAL LOW (ref 0.3–1.2)
Total Protein: 6.9 g/dL (ref 6.5–8.1)

## 2019-10-08 LAB — URINALYSIS, ROUTINE W REFLEX MICROSCOPIC
Bilirubin Urine: NEGATIVE
Glucose, UA: NEGATIVE mg/dL
Hgb urine dipstick: NEGATIVE
Ketones, ur: 5 mg/dL — AB
Leukocytes,Ua: NEGATIVE
Nitrite: NEGATIVE
Protein, ur: NEGATIVE mg/dL
Specific Gravity, Urine: 1.011 (ref 1.005–1.030)
pH: 7 (ref 5.0–8.0)

## 2019-10-08 LAB — CBC
HCT: 49.2 % (ref 39.0–52.0)
Hemoglobin: 16.5 g/dL (ref 13.0–17.0)
MCH: 28.8 pg (ref 26.0–34.0)
MCHC: 33.5 g/dL (ref 30.0–36.0)
MCV: 86 fL (ref 80.0–100.0)
Platelets: 298 10*3/uL (ref 150–400)
RBC: 5.72 MIL/uL (ref 4.22–5.81)
RDW: 12.2 % (ref 11.5–15.5)
WBC: 14.4 10*3/uL — ABNORMAL HIGH (ref 4.0–10.5)
nRBC: 0 % (ref 0.0–0.2)

## 2019-10-08 LAB — RAPID URINE DRUG SCREEN, HOSP PERFORMED
Amphetamines: NOT DETECTED
Barbiturates: NOT DETECTED
Benzodiazepines: NOT DETECTED
Cocaine: NOT DETECTED
Opiates: NOT DETECTED
Tetrahydrocannabinol: POSITIVE — AB

## 2019-10-08 LAB — LIPASE, BLOOD: Lipase: 68 U/L — ABNORMAL HIGH (ref 11–51)

## 2019-10-08 MED ORDER — PROMETHAZINE HCL 25 MG/ML IJ SOLN
25.0000 mg | Freq: Once | INTRAMUSCULAR | Status: AC
  Administered 2019-10-08: 25 mg via INTRAVENOUS
  Filled 2019-10-08: qty 1

## 2019-10-08 MED ORDER — PROMETHAZINE HCL 25 MG RE SUPP: 25.0000 mg | Freq: Four times a day (QID) | RECTAL | 0 refills | Status: DC | PRN

## 2019-10-08 MED ORDER — CAPSAICIN 0.025 % EX CREA
TOPICAL_CREAM | Freq: Once | CUTANEOUS | Status: AC
  Filled 2019-10-08: qty 60

## 2019-10-08 MED ORDER — LACTATED RINGERS IV BOLUS
2000.0000 mL | Freq: Once | INTRAVENOUS | Status: AC
  Administered 2019-10-08: 2000 mL via INTRAVENOUS

## 2019-10-08 MED ORDER — HALOPERIDOL LACTATE 5 MG/ML IJ SOLN
5.0000 mg | Freq: Once | INTRAMUSCULAR | Status: AC
  Administered 2019-10-08: 5 mg via INTRAVENOUS
  Filled 2019-10-08: qty 1

## 2019-10-08 NOTE — ED Provider Notes (Signed)
  Physical Exam  BP (!) 154/106 (BP Location: Left Arm)   Pulse 66   Temp 98.2 F (36.8 C) (Oral)   Resp 16   Ht 6' (1.829 m)   Wt 88.5 kg   SpO2 100%   BMI 26.45 kg/m   Physical Exam  ED Course/Procedures     Procedures  MDM  Care assumed at 4 pm.  Patient was recently diagnosed with cannabis associated hyperemesis and was admitted to the hospital.  Patient was discharged and has more.  Signout pending labs and IV fluids and antinausea medicines and reassessment.  5:58 PM Given 2 L LR and multiple meds for vomiting. Felt better now. No vomiting in the ED. Offered oral vs rectal phenergan and he wants to try suppository. Labs reassuring and lipase only mildly elevated. Small amount of ketone in UA. UDS + marijuana. Stable for discharge. Gave strict return precautions     Charlynne Pander, MD 10/08/19 1759

## 2019-10-08 NOTE — ED Provider Notes (Signed)
[facility] EMERGENCY DEPARTMENT Provider Note   CSN: [id number] Arrival date & time: 10/08/19  0945     History Chief Complaint  Patient presents with  . Abdominal Pain  . Emesis    Andrew Castillo is a 24 y.o. male.  HPI Patient reports that he was just recently discharged from the hospital for the same symptoms.  He reports he "thinks he left the hospital too soon".  He reports as soon as he got home he started having recurrent episodes of vomiting again and cannot tolerate any fluids or liquids.  Reports he has had a few episodes of diarrhea but not much.  No fever.  No significant abdominal pain.  He reports he thinks he is not making as much urine because he is not drinking much.  No chest pain or cough.    Past Medical History:  Diagnosis Date  . Childhood asthma   . Environmental allergies   . GERD (gastroesophageal reflux disease)   . Headache    migraines  . Seasonal allergic conjunctivitis     Patient Active Problem List   Diagnosis Date Noted  . Enteritis 10/06/2019  . Tobacco use disorder 06/24/2019  . Intractable nausea and vomiting 05/02/2016  . Esophageal reflux 05/02/2016  . Visit for preventive health examination 04/30/2016  . Seizure-like activity (HCC) 04/30/2016  . Anxiety and depression 11/21/2015  . Gastroesophageal reflux disease with esophagitis 07/15/2015  . Inattention 07/15/2015  . RHINITIS, ALLERGIC 11/14/2006  . ASTHMA, UNSPECIFIED 11/14/2006  . ECZEMA, ATOPIC DERMATITIS 11/14/2006    Past Surgical History:  Procedure Laterality Date  . Left Knee Menuiscus     "several surguries on same knee"  . NM ESOPHAGEAL REFLUX    . Right Shoulder Surgery    . TONSILLECTOMY     adenoids       Family History  Problem Relation Age of Onset  . Hypertension Father   . Hyperlipidemia Father        Living  . Liver disease Father   . Fibromyalgia Mother   . Thyroid disease Mother        Living  . Lupus Mother   .  Anxiety disorder Mother   . Clotting disorder Mother   . Crohn's disease Mother   . Bone cancer Paternal Grandmother   . Heart disease Paternal Grandfather   . Diabetes Paternal Grandfather   . Diabetes Maternal Grandmother   . Hypertension Maternal Grandmother   . Liver disease Maternal Grandmother   . Anxiety disorder Maternal Aunt   . Depression Maternal Aunt   . Lupus Maternal Aunt   . Diabetes Paternal Aunt   . Lupus Paternal Aunt   . Fibromyalgia Paternal Aunt   . Drug abuse Paternal Uncle   . ADD / ADHD Brother   . Anxiety disorder Sister   . Migraines Other        Various  . Seizures Brother     Social History   Tobacco Use  . Smoking status: Current Every Day Smoker    Packs/day: 0.50    Types: Cigarettes    Last attempt to quit: 04/17/2016    Years since quitting: 3.4  . Smokeless tobacco: Never Used  . Tobacco comment: 1 pack every 2-3 days  Substance Use Topics  . Alcohol use: Yes    Alcohol/week: 0.0 standard drinks    Comment: socially  . Drug use: Yes    Types: Marijuana    Comment: daily  Home Medications Prior to Admission medications   Medication Sig Start Date End Date Taking? Authorizing Provider  ondansetron (ZOFRAN ODT) 4 MG disintegrating tablet Take 1 tablet (4 mg total) by mouth every 8 (eight) hours as needed for nausea or vomiting. 10/06/19  Yes Aberman, Druscilla Brownie, PA-C    Allergies    Penicillins  Review of Systems   Review of Systems 10 Systems reviewed and are negative for acute change except as noted in the HPI.  Physical Exam Updated Vital Signs BP (!) 154/106 (BP Location: Left Arm)   Pulse 66   Temp 98.2 F (36.8 C) (Oral)   Resp 16   Ht 6' (1.829 m)   Wt 88.5 kg   SpO2 100%   BMI 26.45 kg/m   Physical Exam Constitutional:      Appearance: He is well-developed.  HENT:     Head: Normocephalic and atraumatic.  Eyes:     Extraocular Movements: Extraocular movements intact.  Cardiovascular:     Rate and Rhythm:  Normal rate and regular rhythm.     Heart sounds: Normal heart sounds.  Pulmonary:     Effort: Pulmonary effort is normal.     Breath sounds: Normal breath sounds.  Abdominal:     General: Bowel sounds are normal. There is no distension.     Palpations: Abdomen is soft.     Tenderness: There is no abdominal tenderness.  Musculoskeletal:        General: Normal range of motion.     Cervical back: Neck supple.  Skin:    General: Skin is warm and dry.  Neurological:     Mental Status: He is alert and oriented to person, place, and time.     GCS: GCS eye subscore is 4. GCS verbal subscore is 5. GCS motor subscore is 6.     Coordination: Coordination normal.     ED Results / Procedures / Treatments   Labs (all labs ordered are listed, but only abnormal results are displayed) Labs Reviewed  LIPASE, BLOOD - Abnormal; Notable for the following components:      Result Value   Lipase 68 (*)    All other components within normal limits  COMPREHENSIVE METABOLIC PANEL - Abnormal; Notable for the following components:   Glucose, Bld 110 (*)    Total Bilirubin 0.2 (*)    All other components within normal limits  CBC - Abnormal; Notable for the following components:   WBC 14.4 (*)    All other components within normal limits  URINALYSIS, ROUTINE W REFLEX MICROSCOPIC    EKG None  Radiology No results found.  Procedures Procedures (including critical care time)  Medications Ordered in ED Medications  haloperidol lactate (HALDOL) injection 5 mg (has no administration in time range)  lactated ringers bolus 2,000 mL (has no administration in time range)  capsaicin (ZOSTRIX) 0.025 % cream (has no administration in time range)  promethazine (PHENERGAN) injection 25 mg (has no administration in time range)    ED Course  I have reviewed the triage vital signs and the nursing notes.  Pertinent labs & imaging results that were available during my care of the patient were reviewed by  me and considered in my medical decision making (see chart for details).    MDM Rules/Calculators/A&P                      Patient is nontoxic and alert.  He reports continuous vomiting.  He just recently  was admitted for tractable nausea and vomiting.  He was discharged yesterday.  Patient is nontoxic in appearance and abdomen is soft without guarding.  I do not have suspicion at this time for obstruction or acute surgical abdomen.  Will obtain basic lab work and initiate fluid resuscitation with Haldol for nausea and capsaicin cream.  Dr. Silverio Lay to reassess after treatment for symptom improvement or resolution. Final Clinical Impression(s) / ED Diagnoses Final diagnoses:  Intractable nausea and vomiting    Rx / DC Orders ED Discharge Orders    None       Arby Barrette, MD 10/08/19 1537

## 2019-10-08 NOTE — ED Triage Notes (Signed)
Pt endorses abd pain, N/V since Friday night. Was discharged yesterday. No relief with prescribed medications.

## 2019-10-08 NOTE — Discharge Instructions (Signed)
Stay hydrated   Take phenergan for nausea   See your doctor   Return to ER if you have worse abdominal pain, vomiting, fever, unable to keep anything down

## 2019-10-08 NOTE — Telephone Encounter (Signed)
LM requesting call back to complete TCM and schedule hospital follow up.   

## 2019-10-08 NOTE — ED Notes (Signed)
Patient verbalizes understanding of discharge instructions. Opportunity for questioning and answers were provided. Armband removed by staff, pt discharged from ED ambulatory.   

## 2019-10-13 ENCOUNTER — Other Ambulatory Visit: Payer: Self-pay | Admitting: Physician Assistant

## 2019-10-13 ENCOUNTER — Other Ambulatory Visit: Payer: Self-pay

## 2019-10-13 ENCOUNTER — Ambulatory Visit (INDEPENDENT_AMBULATORY_CARE_PROVIDER_SITE_OTHER): Payer: Managed Care, Other (non HMO) | Admitting: Physician Assistant

## 2019-10-13 ENCOUNTER — Encounter: Payer: Self-pay | Admitting: Physician Assistant

## 2019-10-13 DIAGNOSIS — R1111 Vomiting without nausea: Secondary | ICD-10-CM | POA: Diagnosis not present

## 2019-10-13 MED ORDER — PROMETHAZINE HCL 25 MG RE SUPP: 25.0000 mg | Freq: Four times a day (QID) | RECTAL | 0 refills | Status: DC | PRN

## 2019-10-13 NOTE — Progress Notes (Signed)
Virtual Visit via Video   I connected with patient on 10/13/19 at  9:30 AM EST by a video enabled telemedicine application and verified that I am speaking with the correct person using two identifiers.  Location patient: Home Location provider: [facility], Office Persons participating in the virtual visit: Patient, Provider, [place] (Patina Moore)  I discussed the limitations of evaluation and management by telemedicine and the availability of in person appointments. The patient expressed understanding and agreed to proceed.  Subjective:   HPI:   Patient presents via doxy.need today to discuss ongoing issues with emesis.  Patient has been seen in the emergency room multiple times in the past month including one hospitalization.  Records available in EMR and have been reviewed in detail.  Patient endorses several weeks of episodic, nonbloody emesis. States symptoms usually start after he attempts to eat. States that food comes back up almost immediately. Denies any sensation of nausea prior to emesis. Denies bloody or coffee-ground. Can sometimes get bland foods to stay down. Is keeping well hydrated. Denies heartburn, diarrhea, constipation, melena, hematochezia or tenesmus. Denies any difficulty with swallowing. Will have occasional stomach cramps. Denies any recent change to diet or activity level. No recent alcohol consumption. Is a smoker but notes cutting back on the significantly since symptoms began. Does still smoke marijuana daily.  Patient's ER/hospital work-up included labs which were overall unremarkable except for mild alterations in calcium which were normal on recheck, and recent mild elevation in lipase. CT abdomen negative for acute findings but found some increased right lower adenopathy with some concern for mesenteric adenitis, of which patient has a history.  Patient endorses using his promethazine suppositories to help with nausea and vomiting. Not taking anything  else OTC at present. Has previously seen gastroenterology but has not done so in around 3 years.  ROS:   See pertinent positives and negatives per HPI.  Patient Active Problem List   Diagnosis Date Noted  . Enteritis 10/06/2019  . Tobacco use disorder 06/24/2019  . Intractable nausea and vomiting 05/02/2016  . Esophageal reflux 05/02/2016  . Visit for preventive health examination 04/30/2016  . Seizure-like activity ([place]) 04/30/2016  . Anxiety and depression 11/21/2015  . Gastroesophageal reflux disease with esophagitis 07/15/2015  . Inattention 07/15/2015  . RHINITIS, ALLERGIC 11/14/2006  . ASTHMA, UNSPECIFIED 11/14/2006  . ECZEMA, ATOPIC DERMATITIS 11/14/2006    Social History   Tobacco Use  . Smoking status: Current Every Day Smoker    Packs/day: 0.50    Types: Cigarettes    Last attempt to quit: 04/17/2016    Years since quitting: 3.4  . Smokeless tobacco: Never Used  . Tobacco comment: 1 pack every 2-3 days  Substance Use Topics  . Alcohol use: Yes    Alcohol/week: 0.0 standard drinks    Comment: socially    Current Outpatient Medications:  .  ondansetron (ZOFRAN ODT) 4 MG disintegrating tablet, Take 1 tablet (4 mg total) by mouth every 8 (eight) hours as needed for nausea or vomiting., Disp: 20 tablet, Rfl: 0 .  promethazine (PHENERGAN) 25 MG suppository, Place 1 suppository (25 mg total) rectally every 6 (six) hours as needed for nausea or vomiting., Disp: 12 each, Rfl: 0  Allergies  Allergen Reactions  . Penicillins Hives    Did it involve swelling of the face/tongue/throat, SOB, or low BP? Unknown Did it involve sudden or severe rash/hives, skin peeling, or any reaction on the inside of your mouth or nose? Unknown Did you need  to seek medical attention at a hospital or doctor's office? Unknown When did it last happen?Pt doesn't remember he said he was a child If all above answers are "NO", may proceed with cephalosporin use.     Objective:   There  were no vitals taken for this visit.  Patient is well-developed, well-nourished in no acute distress.  Resting comfortably at home.  Head is normocephalic, atraumatic.  No labored breathing.  Speech is clear and coherent with logical content.  Patient is alert and oriented at baseline.   Assessment and Plan:   1. Non-intractable vomiting without nausea, unspecified vomiting type Discussed with patient that marijuana usage is certainly contributing to symptoms, could potentially be the cause. He has again been encouraged to stop this. Increase fluids. Continue bland diet. Continue promethazine suppository. Patient needs GI assessment as work-up thus far otherwise unremarkable. May potentially benefit from EGD and colonoscopy. Urgent referral to GI placed for further assessment. Plan for repeat CBC and lipase. Patient will call to schedule lab appointment. Strict ER precautions reviewed with patient. Patient voiced understanding and agreement with plan.    [name], PA-C 10/13/2019

## 2019-10-13 NOTE — Patient Instructions (Signed)
Instructions sent to MyChart.  Please keep well-hydrated and try to get plenty of rest. Stop use of marijuana --this is at least contributing to your symptoms if not the cause. Try to start a daily probiotic. Try to eat very small portions of bland food. Eat slowly. The main thing is that you continue to stay well-hydrated. If vomiting becomes more frequent, severe or if you have any issue keeping down fluids or develop diarrhea --you will need reassessment in the ER.  I have refilled your antinausea medications. I am try to get you in with gastroenterology ASAP. Please keep your phone on as they will be giving you a call.

## 2019-10-14 ENCOUNTER — Emergency Department (HOSPITAL_COMMUNITY): Payer: Managed Care, Other (non HMO)

## 2019-10-14 ENCOUNTER — Emergency Department (HOSPITAL_COMMUNITY)
Admission: EM | Admit: 2019-10-14 | Discharge: 2019-10-14 | Disposition: A | Discharge status: Home / Self Care | Payer: Managed Care, Other (non HMO) | Attending: Emergency Medicine | Admitting: Emergency Medicine

## 2019-10-14 ENCOUNTER — Other Ambulatory Visit: Payer: Self-pay

## 2019-10-14 ENCOUNTER — Telehealth: Payer: Self-pay | Admitting: Internal Medicine

## 2019-10-14 DIAGNOSIS — R112 Nausea with vomiting, unspecified: Secondary | ICD-10-CM | POA: Insufficient documentation

## 2019-10-14 DIAGNOSIS — J45909 Unspecified asthma, uncomplicated: Secondary | ICD-10-CM | POA: Insufficient documentation

## 2019-10-14 DIAGNOSIS — F419 Anxiety disorder, unspecified: Secondary | ICD-10-CM | POA: Insufficient documentation

## 2019-10-14 DIAGNOSIS — F17210 Nicotine dependence, cigarettes, uncomplicated: Secondary | ICD-10-CM | POA: Insufficient documentation

## 2019-10-14 DIAGNOSIS — R6883 Chills (without fever): Secondary | ICD-10-CM | POA: Insufficient documentation

## 2019-10-14 DIAGNOSIS — R1010 Upper abdominal pain, unspecified: Secondary | ICD-10-CM | POA: Diagnosis not present

## 2019-10-14 LAB — COMPREHENSIVE METABOLIC PANEL
ALT: 17 U/L (ref 0–44)
AST: 17 U/L (ref 15–41)
Albumin: 4.7 g/dL (ref 3.5–5.0)
Alkaline Phosphatase: 60 U/L (ref 38–126)
Anion gap: 13 (ref 5–15)
BUN: 15 mg/dL (ref 6–20)
CO2: 24 mmol/L (ref 22–32)
Calcium: 9.8 mg/dL (ref 8.9–10.3)
Chloride: 101 mmol/L (ref 98–111)
Creatinine, Ser: 1.24 mg/dL (ref 0.61–1.24)
GFR calc Af Amer: >60 mL/min (ref >60)
GFR calc non Af Amer: >60 mL/min (ref >60)
Glucose, Bld: 135 mg/dL — ABNORMAL HIGH (ref 70–99)
Potassium: 3.9 mmol/L (ref 3.5–5.1)
Sodium: 138 mmol/L (ref 135–145)
Total Bilirubin: 1.4 mg/dL — ABNORMAL HIGH (ref 0.3–1.2)
Total Protein: 8.2 g/dL — ABNORMAL HIGH (ref 6.5–8.1)

## 2019-10-14 LAB — CBC
HCT: 51.5 % (ref 39.0–52.0)
Hemoglobin: 17.3 g/dL — ABNORMAL HIGH (ref 13.0–17.0)
MCH: 28.9 pg (ref 26.0–34.0)
MCHC: 33.6 g/dL (ref 30.0–36.0)
MCV: 86 fL (ref 80.0–100.0)
Platelets: 378 10*3/uL (ref 150–400)
RBC: 5.99 MIL/uL — ABNORMAL HIGH (ref 4.22–5.81)
RDW: 12 % (ref 11.5–15.5)
WBC: 16.3 10*3/uL — ABNORMAL HIGH (ref 4.0–10.5)
nRBC: 0 % (ref 0.0–0.2)

## 2019-10-14 LAB — LIPASE, BLOOD: Lipase: 40 U/L (ref 11–51)

## 2019-10-14 MED ORDER — SODIUM CHLORIDE 0.9 % IV BOLUS
1000.0000 mL | Freq: Once | INTRAVENOUS | Status: AC
  Administered 2019-10-14: 1000 mL via INTRAVENOUS

## 2019-10-14 MED ORDER — SODIUM CHLORIDE 0.9% FLUSH: 3.0000 mL | Freq: Once | INTRAVENOUS | Status: DC

## 2019-10-14 MED ORDER — CAPSAICIN 0.025 % EX CREA
TOPICAL_CREAM | Freq: Once | CUTANEOUS | Status: AC
  Filled 2019-10-14: qty 60

## 2019-10-14 MED ORDER — ONDANSETRON 4 MG PO TBDP: 4.0000 mg | ORAL_TABLET | Freq: Three times a day (TID) | ORAL | 0 refills | Status: DC | PRN

## 2019-10-14 MED ORDER — HALOPERIDOL LACTATE 5 MG/ML IJ SOLN
2.0000 mg | Freq: Once | INTRAMUSCULAR | Status: AC
  Administered 2019-10-14: 16:00:00 2 mg via INTRAVENOUS
  Filled 2019-10-14: qty 1

## 2019-10-14 NOTE — Telephone Encounter (Signed)
If he fails promethazine suppository q 8-12 hours running he will need to go back to ED  Rib pain not a surprise given the vomiting.  We cannot do anything more for him in the office than has been done so far I think and if he is vomiting as frequently as they say and the suppositories do not work will need IV treatment (which is not available here)

## 2019-10-14 NOTE — Telephone Encounter (Signed)
Called the patient. Spoke with the patient who has not been seen in the office since 05/02/2016, last ov with JZ. Patient scheduled with PG on 10/20/2019 (earliest available).   Dr. Lucretia Kern Complaints of cyclic vomiting. Several ED visit (last visit 10/08/19), treated for dehydration and given antiemetics. Patient reports slight decrease in symptoms but reports resume marijuana usage (10/11/19) which aggravated his symptoms. Patient told to cease all marijuana usage, continue promethazine suppositories and stay hydrated.

## 2019-10-14 NOTE — Telephone Encounter (Signed)
Brittani I called Sheri to confirm who this would go to since we talked about it the other day.  She said if the pt was seen by the doc for procedure, office vist, etc. After the app it goes back to the doc.  I am happy to take care of this just wanted you to know what we found out.

## 2019-10-14 NOTE — Telephone Encounter (Signed)
Left detailed message for the patient to report to the ED for continued vomiting and tx for possible dehydration.

## 2019-10-14 NOTE — ED Provider Notes (Signed)
[facility]ENC[facility] DEPARTMENT Provider Note   CSN: [id number] Arrival date & time: 10/14/19  1429     History Chief Complaint  Patient presents with   Abdominal Pain    Andrew Castillo is a 24 y.o. male with past medical history significant for GERD, chronic headache, intractable nausea vomiting, cannabinoid hyperemesis who presents for evaluation of emesis.  Patient states he has had daily episode of emesis x2 weeks.  He has been seen in the hospital multiple times.  He had a recent admission less than 2 weeks ago for intractable nausea and vomiting.  He has a follow-up appoint with [facility] on Monday however he has been taking rectal suppositories as well as ODT Zofran without relief of his symptoms at home.  States he stopped smoking marijuana 1 week ago.  He denies any alcohol use, chronic NSAID use.  Has some generalized upper abdominal pain which is remained unchanged over the last 2 weeks.  Pain begins after emesis.  Patient states he does get chills after his episode of emesis.  Patient states he can normally tolerate things and 3 to 4 hours later vomits them up.  Multiple NBNB emesis.  No diarrhea or constipation however has had overall decrease in his bowel movements which he relates secondary to decreased p.o. intake.  He has been trying soft food at home, brat diet which has not relieved his symptoms.  He denies fever, chills, chest pain, shortness of breath, hemoptysis, dysuria, diarrhea, constipation, rashes, lesions.  Denies any dysuria, hematuria.  No cough, upper respiratory symptoms or known Covid exposures.  Last episode of emesis 3 hours PTA.  Patient is also been seen multiple times by his PCP for this without relief.  He admits to endoscopy in 2017 however is not on any additional scopes.  He did have a CT scan approximately 1 week ago which showed possible mesenteric adenitis however no other significant findings.  Denies additional aggravating or  alleviating factors.  History obtained from patient and past medical records.  No interpreter is used.  HPI     Past Medical History:  Diagnosis Date   Childhood asthma    Environmental allergies    GERD (gastroesophageal reflux disease)    Headache    migraines   Seasonal allergic conjunctivitis     Patient Active Problem List   Diagnosis Date Noted   Enteritis 10/06/2019   Tobacco use disorder 06/24/2019   Intractable nausea and vomiting 05/02/2016   Esophageal reflux 05/02/2016   Visit for preventive health examination 04/30/2016   Seizure-like activity ([place]) 04/30/2016   Anxiety and depression 11/21/2015   Gastroesophageal reflux disease with esophagitis 07/15/2015   Inattention 07/15/2015   RHINITIS, ALLERGIC 11/14/2006   ASTHMA, UNSPECIFIED 11/14/2006   ECZEMA, ATOPIC DERMATITIS 11/14/2006    Past Surgical History:  Procedure Laterality Date   Left Knee Menuiscus     "several surguries on same knee"   NM ESOPHAGEAL REFLUX     Right Shoulder Surgery     TONSILLECTOMY     adenoids       Family History  Problem Relation Age of Onset   Hypertension Father    Hyperlipidemia Father        Living   Liver disease Father    Fibromyalgia Mother    Thyroid disease Mother        Living   Lupus Mother    Anxiety disorder Mother    Clotting disorder Mother  Crohn's disease Mother    Bone cancer Paternal Grandmother    Heart disease Paternal Grandfather    Diabetes Paternal Grandfather    Diabetes Maternal Grandmother    Hypertension Maternal Grandmother    Liver disease Maternal Grandmother    Anxiety disorder Maternal Aunt    Depression Maternal Aunt    Lupus Maternal Aunt    Diabetes Paternal Aunt    Lupus Paternal Aunt    Fibromyalgia Paternal Aunt    Drug abuse Paternal Uncle    ADD / ADHD Brother    Anxiety disorder Sister    Migraines Other        Various   Seizures Brother     Social History    Tobacco Use   Smoking status: Current Every Day Smoker    Packs/day: 0.50    Types: Cigarettes    Last attempt to quit: 04/17/2016    Years since quitting: 3.4   Smokeless tobacco: Never Used   Tobacco comment: 1 pack every 2-3 days  Substance Use Topics   Alcohol use: Yes    Alcohol/week: 0.0 standard drinks    Comment: socially   Drug use: Yes    Types: Marijuana    Comment: daily    Home Medications Prior to Admission medications   Medication Sig Start Date End Date Taking? Authorizing Provider  ondansetron (ZOFRAN ODT) 4 MG disintegrating tablet Take 1 tablet (4 mg total) by mouth every 8 (eight) hours as needed for nausea or vomiting. 10/14/19   Rayon Mcchristian A, PA-C  promethazine (PHENERGAN) 25 MG suppository Place 1 suppository (25 mg total) rectally every 6 (six) hours as needed for nausea or vomiting. 10/13/19   Waldon Merl, PA-C    Allergies    Penicillins  Review of Systems   Review of Systems  Constitutional: Negative.   HENT: Negative.   Respiratory: Negative.   Cardiovascular: Negative.   Gastrointestinal: Positive for abdominal pain, nausea and vomiting. Negative for anal bleeding, blood in stool, constipation, diarrhea and rectal pain.  Genitourinary: Negative.   Musculoskeletal: Negative.   Skin: Negative.   Neurological: Negative.   All other systems reviewed and are negative.   Physical Exam Updated Vital Signs BP (!) 142/78 (BP Location: Left Arm)    Pulse 80    Temp 98.2 F (36.8 C) (Oral)    Resp 18    SpO2 98%   Physical Exam Vitals and nursing note reviewed.  Constitutional:      General: He is not in acute distress.    Appearance: He is well-developed. He is not ill-appearing, toxic-appearing or diaphoretic.  HENT:     Head: Normocephalic and atraumatic.     Mouth/Throat:     Mouth: Mucous membranes are moist.  Eyes:     Pupils: Pupils are equal, round, and reactive to light.  Cardiovascular:     Rate and Rhythm: Regular  rhythm. Tachycardia present.     Heart sounds: Normal heart sounds.     Comments: HR 95-105 in room however patient appear anxious Pulmonary:     Effort: Pulmonary effort is normal. No respiratory distress.     Breath sounds: Normal breath sounds.     Comments: Clear to auscultation bilaterally without wheeze, rhonchi or rales.  Speaks in full sentences without difficulty. Abdominal:     General: Bowel sounds are normal. There is no distension.     Palpations: Abdomen is soft.     Tenderness: There is no abdominal tenderness. There is no  right CVA tenderness, left CVA tenderness, guarding or rebound. Negative signs include Murphy's sign and McBurney's sign.     Hernia: No hernia is present.     Comments: Soft, nontender without rebound or guarding.  Negative Murphy sign.  No overlying skin changes.  No evidence of abdominal wall herniations.  Musculoskeletal:        General: Normal range of motion.     Cervical back: Normal range of motion and neck supple.     Comments: Moves all 4 extremities without difficulty.  Calves soft, nontender.  Skin:    General: Skin is warm and dry.     Capillary Refill: Capillary refill takes less than 2 seconds.     Comments: Brisk capillary refill.  No pallor.  Neurological:     Mental Status: He is alert.     Comments: Ambulatory in room without difficulty.  Psychiatric:     Comments: Mildly anxious.     ED Results / Procedures / Treatments   Labs (all labs ordered are listed, but only abnormal results are displayed) Labs Reviewed  COMPREHENSIVE METABOLIC PANEL - Abnormal; Notable for the following components:      Result Value   Glucose, Bld 135 (*)    Total Protein 8.2 (*)    Total Bilirubin 1.4 (*)    All other components within normal limits  CBC - Abnormal; Notable for the following components:   WBC 16.3 (*)    RBC 5.99 (*)    Hemoglobin 17.3 (*)    All other components within normal limits  LIPASE, BLOOD  URINALYSIS, ROUTINE W  REFLEX MICROSCOPIC    EKG None  Radiology DG Abdomen Acute W/Chest  Result Date: 10/14/2019 CLINICAL DATA:  Vomiting over the last 10 days. EXAM: DG ABDOMEN ACUTE W/ 1V CHEST COMPARISON:  None. FINDINGS: There is no evidence of dilated bowel loops or free intraperitoneal air. No radiopaque calculi or other significant radiographic abnormality is seen. Heart size and mediastinal contours are within normal limits. Both lungs are clear. IMPRESSION: Negative abdominal radiographs.  No acute cardiopulmonary disease. Electronically Signed   By: Paulina Fusi M.D.   On: 10/14/2019 17:10    Procedures Procedures (including critical care time)  Medications Ordered in ED Medications  sodium chloride flush (NS) 0.9 % injection 3 mL (3 mLs Intravenous Not Given 10/14/19 1621)  sodium chloride 0.9 % bolus 1,000 mL (0 mLs Intravenous Stopped 10/14/19 1830)  haloperidol lactate (HALDOL) injection 2 mg (2 mg Intravenous Given 10/14/19 1621)  capsaicin (ZOSTRIX) 0.025 % cream ( Topical Given 10/14/19 1706)    ED Course  I have reviewed the triage vital signs and the nursing notes.  Pertinent labs & imaging results that were available during my care of the patient were reviewed by me and considered in my medical decision making (see chart for details).  24 year old male appears otherwise well presents for evaluation of emesis.  Afebrile, nonseptic, non-ill-appearing.  Patient with history of cyclical vomiting versus cannabinoid hyperemesis.  Has been seen multiple times without resolved.  Has been taking Zofran and Phenergan suppositories at home without relief.  He has been seen by his PCP as well and has an appointment with [facility].  2017 had upper endoscopy however no additional studies since.  Did have a CT scan the 19th which showed possible mesenteric adenitis.  Heart and lungs clear.  He is mildly tachycardic however does appear mildly dry and anxious.  Abdomen soft, nontender without rebound or  guarding.  No overlying skin changes.  Patient did state he stopped smoking marijuana 1 week ago.  Plan for labs, IV fluids.  Labs and imaging personally reviewed and interpreted. CBC with leukocytosis at 16.3, similar to labs.  Hemoglobin 17.3 likely resultant from hemoconcentration from dehydration CMP mild hyperglycemia to 135, bili 1.4, similar to some of his previous labs Plain film abdomen acute with chest without any significant findings. EKG without STEMI, sinus tachycardia, similar to previous.  1750: Patient reassessed.  No emesis since Haldol in ED.  He is feeling much improvement with nausea.  Will try p.o. challenge.  He is requesting ginger ale.  Continued benign, nonsurgical abdominal exam.  Negative Murphy sign. Tachycardia improved with IVF. NO dvt on exam, no CpPOB, cough.  1830: Patient able to tolerate to ginger ale's without any emesis in ED.  No current nausea.  On reevaluation abdomen soft, nontender.  I low suspicion for acute intra-abdominal process.  Emesis likely due to his cyclical vomiting.  He does have follow-up with GI on Monday.  He is out of his home Zofran.  Will order.  Patient feels comfortable going home.  Discussed small amounts of liquids at a time.    Patient is nontoxic, nonseptic appearing, in no apparent distress.  Patient's pain and other symptoms adequately managed in emergency department.  Fluid bolus given.  Labs, imaging and vitals reviewed.  Patient does not meet the SIRS or Sepsis criteria.  On repeat exam patient does not have a surgical abdomin and there are no peritoneal signs.  No indication of appendicitis, bowel obstruction, bowel perforation, cholecystitis, diverticulitis.  Patient discharged home with symptomatic treatment and given strict instructions for follow-up with their primary care physician.  I have also discussed reasons to return immediately to the ER.  Patient expresses understanding and agrees with plan.  The patient has been  appropriately medically screened and/or stabilized in the ED. I have low suspicion for any other emergent medical condition which would require further screening, evaluation or treatment in the ED or require inpatient management.  Patient is hemodynamically stable and in no acute distress.  Patient able to ambulate in department prior to ED.  Evaluation does not show acute pathology that would require ongoing or additional emergent interventions while in the emergency department or further inpatient treatment.  I have discussed the diagnosis with the patient and answered all questions.  Pain is been managed while in the emergency department and patient has no further complaints prior to discharge.  Patient is comfortable with plan discussed in room and is stable for discharge at this time.  I have discussed strict return precautions for returning to the emergency department.  Patient was encouraged to follow-up with PCP/specialist refer to at discharge.    MDM Rules/Calculators/A&P                       Final Clinical Impression(s) / ED Diagnoses Final diagnoses:  Non-intractable vomiting with nausea, unspecified vomiting type    Rx / DC Orders ED Discharge Orders         Ordered    ondansetron (ZOFRAN ODT) 4 MG disintegrating tablet  Every 8 hours PRN     10/14/19 1825           Ikeya Brockel A, PA-C 10/14/19 1834    Geoffery Lyons, MD 10/14/19 2332

## 2019-10-14 NOTE — ED Triage Notes (Signed)
Pt reports abd pain for 2 weeks was seen on 1/21 and pt reports being no better still having abd pain with emesis. Pt states he has been using promethazine and not smoking any marijuana but has not improved sxs.

## 2019-10-14 NOTE — Telephone Encounter (Signed)
Hi Patty, could you please help this pt? Thank you.

## 2019-10-14 NOTE — ED Notes (Signed)
Pt went to x-ray.

## 2019-10-14 NOTE — Telephone Encounter (Signed)
Pt's mother Lanora Manis states that pt has been experiencing vomiting for the past 5 weeks, he is not able to keep anything down. He went to the ED three times and they stated that he gas gastritis. She is requesting an appt asap.

## 2019-10-14 NOTE — Discharge Instructions (Signed)
Follow-up appointment with a GI doctor.  Return for any new or worsening symptoms.

## 2019-10-14 NOTE — ED Notes (Signed)
Pt. Stated that he is unable to go to go to restroom at this time and is aware that we need a sample.

## 2019-10-14 NOTE — Telephone Encounter (Signed)
Pt mom called back states that he now has pain in his ribs and still throwing up.

## 2019-10-14 NOTE — Telephone Encounter (Signed)
Last OV with Doug Sou on 05/02/2016. Please direct this telephone note to the correct nurse.

## 2019-10-19 ENCOUNTER — Other Ambulatory Visit: Payer: Self-pay

## 2019-10-19 ENCOUNTER — Encounter: Payer: Self-pay | Admitting: Nurse Practitioner

## 2019-10-19 ENCOUNTER — Ambulatory Visit: Payer: Managed Care, Other (non HMO) | Admitting: Nurse Practitioner

## 2019-10-19 VITALS — BP 118/64 | HR 94 | Temp 98.6°F | Ht 72.0 in | Wt 185.0 lb

## 2019-10-19 DIAGNOSIS — K219 Gastro-esophageal reflux disease without esophagitis: Secondary | ICD-10-CM

## 2019-10-19 DIAGNOSIS — R112 Nausea with vomiting, unspecified: Secondary | ICD-10-CM

## 2019-10-19 MED ORDER — ONDANSETRON 8 MG PO TBDP: 8.0000 mg | ORAL_TABLET | Freq: Three times a day (TID) | ORAL | 0 refills | Status: DC

## 2019-10-19 MED ORDER — PANTOPRAZOLE SODIUM 40 MG PO TBEC: 40.0000 mg | DELAYED_RELEASE_TABLET | ORAL | 1 refills | Status: DC

## 2019-10-19 NOTE — Progress Notes (Signed)
ASSESSMENT / PLAN:   24 yo male who carries a diagnosis of cyclic nausea / vomiting.  Also with some degree of reflux / regurgitation. He did well for the last few years until 3-4 weeks ago. Despite Zofran and Phenergan he cannot break the cycle. He has lost 10 pounds in last few weeks. Seen in ED a few times. Abdominal series negative. WBC 16K, suspect elevation secondary to volume depletion though WBC has been elevated a other times in ED when seen for other reasons.  Labs otherwise unremarkable.   -Will try doubling dose of Zofran to 8 mg SL Q 8 hours -continue phenergan supp Q8 hours (in between Zofran) -Took mother's Zegerid for a day and it help. Will try Protonix 40 mg Q am for one month. Prop himself up at night.  -If no improvement he will call back. Consider trial of Erythromycin -Can hopefully avoid Nortriptyline or similar drug since he tends to do okay for years in between episodes -Again, I reiterated that THC can cause nausea / vomiting.  -stop Capsaicin, burns his skin    HPI:          Chief Complaint:   vomiting  24 yo male here with his mother with a PMH significant for asthma, seizures, GERD. We saw him in the office in 2017 for a history of cyclic vomiting and possibly  GERD. EGD arranged and remarkable for LA grade B esophagitis. Patient says he has done fine over the last few years. Three weeks ago he started another cycle of nausea and vomiting. Sometimes vomits 10 times a day. Vomits food or just liquid if hasn't eaten. Sometimes a night he feels fluid coming up into his throat. He has lost 10 pounds with this cycle. He called our office 10/14/19 with complaints of vomiting. He had been to the ED a few times . Advised by ED to stop smoking marijuana. He quit for a week, it didn't help. He has been taking SL Zofran 4 mg TID and using Phenergan supp in between. Given Capsaicin  Cream in ED. He thinks it helped the N/V burns his skin. He tried one of his  mother's Zegerid's and felt better.    Past Medical History:  Diagnosis Date  . Childhood asthma   . Environmental allergies   . GERD (gastroesophageal reflux disease)   . Headache    migraines  . Seasonal allergic conjunctivitis      Past Surgical History:  Procedure Laterality Date  . Left Knee Menuiscus     "several surguries on same knee"  . NM ESOPHAGEAL REFLUX    . Right Shoulder Surgery    . TONSILLECTOMY     adenoids   Family History  Problem Relation Age of Onset  . Hypertension Father   . Hyperlipidemia Father        Living  . Liver disease Father   . Fibromyalgia Mother   . Thyroid disease Mother        Living  . Lupus Mother   . Anxiety disorder Mother   . Clotting disorder Mother   . Crohn's disease Mother   . Bone cancer Paternal Grandmother   . Heart disease Paternal Grandfather   . Diabetes Paternal Grandfather   . Diabetes Maternal Grandmother   . Hypertension Maternal Grandmother   . Liver disease Maternal Grandmother   . Anxiety disorder Maternal Aunt   .  Depression Maternal Aunt   . Lupus Maternal Aunt   . Diabetes Paternal Aunt   . Lupus Paternal Aunt   . Fibromyalgia Paternal Aunt   . Drug abuse Paternal Uncle   . ADD / ADHD Brother   . Anxiety disorder Sister   . Migraines Other        Various  . Seizures Brother    Social History   Tobacco Use  . Smoking status: Current Every Day Smoker    Packs/day: 0.50    Types: Cigarettes    Last attempt to quit: 04/17/2016    Years since quitting: 3.5  . Smokeless tobacco: Never Used  . Tobacco comment: 1 pack every 2-3 days  Substance Use Topics  . Alcohol use: Yes    Alcohol/week: 0.0 standard drinks    Comment: socially  . Drug use: Yes    Types: Marijuana    Comment: daily   Current Outpatient Medications  Medication Sig Dispense Refill  . promethazine (PHENERGAN) 25 MG suppository Place 1 suppository (25 mg total) rectally every 6 (six) hours as needed for nausea or vomiting.  12 each 0  . ondansetron (ZOFRAN ODT) 8 MG disintegrating tablet Take 1 tablet (8 mg total) by mouth every 8 (eight) hours. 60 tablet 0  . pantoprazole (PROTONIX) 40 MG tablet Take 1 tablet (40 mg total) by mouth every morning. 30 tablet 1   No current facility-administered medications for this visit.   Allergies  Allergen Reactions  . Penicillins Hives    Did it involve swelling of the face/tongue/throat, SOB, or low BP? Unknown Did it involve sudden or severe rash/hives, skin peeling, or any reaction on the inside of your mouth or nose? Unknown Did you need to seek medical attention at a hospital or doctor's office? Unknown When did it last happen?Pt doesn't remember he said he was a child If all above answers are "NO", may proceed with cephalosporin use.      Review of Systems: All systems reviewed and negative except where noted in HPI.   Serum creatinine: 1.24 mg/dL 10/14/19 1459 Estimated creatinine clearance: 101.7 mL/min   Physical Exam:    Wt Readings from Last 3 Encounters:  10/19/19 185 lb (83.9 kg)  10/08/19 195 lb (88.5 kg)  10/06/19 195 lb (88.5 kg)    BP 118/64   Pulse 94   Temp 98.6 F (37 C)   Ht 6' (1.829 m)   Wt 185 lb (83.9 kg)   BMI 25.09 kg/m  Constitutional:  Pleasant male in no acute distress. Psychiatric: Normal mood and affect. Behavior is normal. EENT: Pupils normal.  Conjunctivae are normal. No scleral icterus. Neck supple.  Cardiovascular: Normal rate, regular rhythm. No edema Pulmonary/chest: Effort normal and breath sounds normal. No wheezing, rales or rhonchi. Abdominal: Soft, nondistended, nontender. Bowel sounds active throughout. There are no masses palpable. No hepatomegaly. Neurological: Alert and oriented to person place and time. Skin: Skin is warm and dry. No rashes noted.  Tye Savoy, NP  10/19/2019, 2:52 PM

## 2019-10-19 NOTE — Patient Instructions (Signed)
If you are age 23 or older, your body mass index should be between 23-30. Your Body mass index is 25.09 kg/m. If this is out of the aforementioned range listed, please consider follow up with your Primary Care Provider.  If you are age 9 or younger, your body mass index should be between 19-25. Your Body mass index is 25.09 kg/m. If this is out of the aformentioned range listed, please consider follow up with your Primary Care Provider.   STOP Capsaicin.   We have sent the following medications to your pharmacy for you to pick up at your convenience: Zofran 8 mg Protonix 40 mg  Call if not better.  Thank you for choosing me and [facility].   Willette Cluster, NP

## 2019-11-10 ENCOUNTER — Other Ambulatory Visit: Payer: Self-pay | Admitting: Nurse Practitioner

## 2019-11-24 ENCOUNTER — Other Ambulatory Visit: Payer: Self-pay | Admitting: Nurse Practitioner

## 2020-02-11 ENCOUNTER — Ambulatory Visit: Payer: Managed Care, Other (non HMO)

## 2020-02-11 ENCOUNTER — Other Ambulatory Visit: Payer: Self-pay

## 2020-02-11 ENCOUNTER — Ambulatory Visit (INDEPENDENT_AMBULATORY_CARE_PROVIDER_SITE_OTHER): Payer: Managed Care, Other (non HMO)

## 2020-02-11 ENCOUNTER — Ambulatory Visit
Admission: EM | Admit: 2020-02-11 | Discharge: 2020-02-11 | Disposition: A | Discharge status: Home / Self Care | Payer: Managed Care, Other (non HMO) | Attending: Emergency Medicine | Admitting: Emergency Medicine

## 2020-02-11 DIAGNOSIS — M545 Low back pain, unspecified: Secondary | ICD-10-CM

## 2020-02-11 DIAGNOSIS — W11XXXA Fall on and from ladder, initial encounter: Secondary | ICD-10-CM

## 2020-02-11 MED ORDER — CYCLOBENZAPRINE HCL 5 MG PO TABS: 5.0000 mg | ORAL_TABLET | Freq: Two times a day (BID) | ORAL | 0 refills | Status: AC | PRN

## 2020-02-11 MED ORDER — IBUPROFEN 800 MG PO TABS: 800.0000 mg | ORAL_TABLET | Freq: Three times a day (TID) | ORAL | 0 refills | Status: DC

## 2020-02-11 NOTE — ED Provider Notes (Signed)
[name]    CSN: [id number] Arrival date & time: 02/11/20  1408      History   Chief Complaint Chief Complaint  Patient presents with  . Back Pain  . Neck Pain    HPI Andrew Castillo is a 24 y.o. male with history of GERD, headache, seasonal allergies, childhood asthma presents for midline low back pain.  States pain is nonradiating.  Has tried topical patch without relief.  States this occurred after he fell backwards off a ladder while holding something yesterday.  No head trauma, LOC.  Denies fever, saddle area anesthesia, lower extremity numbness/weakness, urinary retention, fecal incontinence.   Past Medical History:  Diagnosis Date  . Childhood asthma   . Environmental allergies   . GERD (gastroesophageal reflux disease)   . Headache    migraines  . Seasonal allergic conjunctivitis     Patient Active Problem List   Diagnosis Date Noted  . Enteritis 10/06/2019  . Tobacco use disorder 06/24/2019  . Intractable nausea and vomiting 05/02/2016  . Esophageal reflux 05/02/2016  . Visit for preventive health examination 04/30/2016  . Seizure-like activity ([place]) 04/30/2016  . Anxiety and depression 11/21/2015  . Gastroesophageal reflux disease with esophagitis 07/15/2015  . Inattention 07/15/2015  . RHINITIS, ALLERGIC 11/14/2006  . ASTHMA, UNSPECIFIED 11/14/2006  . ECZEMA, ATOPIC DERMATITIS 11/14/2006    Past Surgical History:  Procedure Laterality Date  . Left Knee Menuiscus     "several surguries on same knee"  . NM ESOPHAGEAL REFLUX    . Right Shoulder Surgery    . TONSILLECTOMY     adenoids       Home Medications    Prior to Admission medications   Medication Sig Start Date End Date Taking? Authorizing Provider  cyclobenzaprine (FLEXERIL) 5 MG tablet Take 1 tablet (5 mg total) by mouth 2 (two) times daily as needed for up to 5 days for muscle spasms. 02/11/20 02/16/20  Hall-Potvin, [place], PA-C  ibuprofen (ADVIL) 800 MG tablet Take 1  tablet (800 mg total) by mouth 3 (three) times daily. 02/11/20   Hall-Potvin, [place], PA-C    Family History Family History  Problem Relation Age of Onset  . Hypertension Father   . Hyperlipidemia Father        Living  . Liver disease Father   . Fibromyalgia Mother   . Thyroid disease Mother        Living  . Lupus Mother   . Anxiety disorder Mother   . Clotting disorder Mother   . Crohn's disease Mother   . Bone cancer Paternal Grandmother   . Heart disease Paternal Grandfather   . Diabetes Paternal Grandfather   . Diabetes Maternal Grandmother   . Hypertension Maternal Grandmother   . Liver disease Maternal Grandmother   . Anxiety disorder Maternal Aunt   . Depression Maternal Aunt   . Lupus Maternal Aunt   . Diabetes Paternal Aunt   . Lupus Paternal Aunt   . Fibromyalgia Paternal Aunt   . Drug abuse Paternal Uncle   . ADD / ADHD Brother   . Anxiety disorder Sister   . Migraines Other        Various  . Seizures Brother     Social History Social History   Tobacco Use  . Smoking status: Current Every Day Smoker    Packs/day: 0.50    Types: Cigarettes    Last attempt to quit: 04/17/2016    Years since quitting: 3.8  . Smokeless tobacco:  Never Used  . Tobacco comment: 1 pack every 2-3 days  Substance Use Topics  . Alcohol use: Yes    Alcohol/week: 0.0 standard drinks    Comment: socially  . Drug use: Yes    Types: Marijuana    Comment: daily     Allergies   Penicillins   Review of Systems As per HPI   Physical Exam Triage Vital Signs ED Triage Vitals  Enc Vitals Group     BP      Pulse      Resp      Temp      Temp src      SpO2      Weight      Height      Head Circumference      Peak Flow      Pain Score      Pain Loc      Pain Edu?      Excl. in GC?    No data found.  Updated Vital Signs BP (!) 151/97   Pulse 92   Temp 97.8 F (36.6 C)   Resp 18   SpO2 97%   Visual Acuity Right Eye Distance:   Left Eye Distance:     Bilateral Distance:    Right Eye Near:   Left Eye Near:    Bilateral Near:     Physical Exam Constitutional:      General: He is not in acute distress.    Appearance: He is not ill-appearing.  HENT:     Head: Normocephalic and atraumatic.  Eyes:     General: No scleral icterus.    Pupils: Pupils are equal, round, and reactive to light.  Cardiovascular:     Rate and Rhythm: Normal rate.  Pulmonary:     Effort: Pulmonary effort is normal. No respiratory distress.     Breath sounds: No wheezing.  Musculoskeletal:        General: Tenderness present. No swelling or deformity. Normal range of motion.     Cervical back: Normal range of motion and neck supple. No tenderness.     Right lower leg: No edema.     Left lower leg: No edema.     Comments: Midline lumbar spine tenderness without crepitus or edema.  Skin:    Coloration: Skin is not jaundiced or pale.  Neurological:     Mental Status: He is alert and oriented to person, place, and time.      UC Treatments / Results  Labs (all labs ordered are listed, but only abnormal results are displayed) Labs Reviewed - No data to display  EKG   Radiology DG Lumbar Spine Complete  Result Date: 02/11/2020 CLINICAL DATA:  Pain status post fall EXAM: LUMBAR SPINE - COMPLETE 4+ VIEW COMPARISON:  None. FINDINGS: There is no evidence of lumbar spine fracture. Alignment is normal. Intervertebral disc spaces are maintained. IMPRESSION: Negative. Electronically Signed   By: Katherine Mantle M.D.   On: 02/11/2020 15:06    Procedures Procedures (including critical care time)  Medications Ordered in UC Medications - No data to display  Initial Impression / Assessment and Plan / UC Course  I have reviewed the triage vital signs and the nursing notes.  Pertinent labs & imaging results that were available during my care of the patient were reviewed by me and considered in my medical decision making (see chart for details).      Patient afebrile, nontoxic, without alarm symptoms as outlined in  HPI.  Lumbar x-ray done in office, reviewed by me radiology: Negative for fracture.  Reviewed signs of patient verbalized understanding.  Will treat supportively as outlined below.  Return precautions discussed, patient verbalized understanding and is agreeable to plan. Final Clinical Impressions(s) / UC Diagnoses   Final diagnoses:  Acute midline low back pain without sciatica     Discharge Instructions     Recommend RICE: rest, ice, compression, elevation as needed for pain.    Heat therapy (hot compress, warm wash rag, hot showers, etc.) can help relax muscles and soothe muscle aches. Cold therapy (ice packs) can be used to help swelling both after injury and after prolonged use of areas of chronic pain/aches.  For pain: recommend 350 mg-1000 mg of Tylenol (acetaminophen) and/or 200 mg - 800 mg of Advil (ibuprofen, Motrin) every 8 hours as needed.  May alternate between the two throughout the day as they are generally safe to take together.  DO NOT exceed more than 3000 mg of Tylenol or 3200 mg of ibuprofen in a 24 hour period as this could damage your stomach, kidneys, liver, or increase your bleeding risk.  May take muscle relaxer as needed for severe pain / spasm.  (This medication may cause you to become tired so it is important you do not drink alcohol or operate heavy machinery while on this medication.  Recommend your first dose to be taken before bedtime to monitor for side effects safely)  Return for worsening pain, numbness, weakness.    ED Prescriptions    Medication Sig Dispense Auth. Provider   ibuprofen (ADVIL) 800 MG tablet Take 1 tablet (800 mg total) by mouth 3 (three) times daily. 21 tablet Hall-Potvin, [place], PA-C   cyclobenzaprine (FLEXERIL) 5 MG tablet Take 1 tablet (5 mg total) by mouth 2 (two) times daily as needed for up to 5 days for muscle spasms. 10 tablet Hall-Potvin, [place], PA-C      I have reviewed the PDMP during this encounter.   Odette Fraction [place], [place] 02/11/20 1911

## 2020-02-11 NOTE — Discharge Instructions (Signed)
Recommend RICE: rest, ice, compression, elevation as needed for pain.    Heat therapy (hot compress, warm wash rag, hot showers, etc.) can help relax muscles and soothe muscle aches. Cold therapy (ice packs) can be used to help swelling both after injury and after prolonged use of areas of chronic pain/aches.  For pain: recommend 350 mg-1000 mg of Tylenol (acetaminophen) and/or 200 mg - 800 mg of Advil (ibuprofen, Motrin) every 8 hours as needed.  May alternate between the two throughout the day as they are generally safe to take together.  DO NOT exceed more than 3000 mg of Tylenol or 3200 mg of ibuprofen in a 24 hour period as this could damage your stomach, kidneys, liver, or increase your bleeding risk.  May take muscle relaxer as needed for severe pain / spasm.  (This medication may cause you to become tired so it is important you do not drink alcohol or operate heavy machinery while on this medication.  Recommend your first dose to be taken before bedtime to monitor for side effects safely)  Return for worsening pain, numbness, weakness.

## 2020-02-11 NOTE — ED Triage Notes (Signed)
Pain from neck down to lower back after heavy equipment fell on patient yesterday. Pt ambulatory.

## 2020-03-07 ENCOUNTER — Other Ambulatory Visit: Payer: Self-pay

## 2020-03-07 ENCOUNTER — Ambulatory Visit
Admission: EM | Admit: 2020-03-07 | Discharge: 2020-03-07 | Disposition: A | Discharge status: Home / Self Care | Payer: Managed Care, Other (non HMO) | Attending: Physician Assistant | Admitting: Physician Assistant

## 2020-03-07 DIAGNOSIS — R05 Cough: Secondary | ICD-10-CM | POA: Diagnosis not present

## 2020-03-07 DIAGNOSIS — R059 Cough, unspecified: Secondary | ICD-10-CM

## 2020-03-07 DIAGNOSIS — R0789 Other chest pain: Secondary | ICD-10-CM

## 2020-03-07 DIAGNOSIS — J029 Acute pharyngitis, unspecified: Secondary | ICD-10-CM

## 2020-03-07 MED ORDER — BENZONATATE 200 MG PO CAPS: 200.0000 mg | ORAL_CAPSULE | Freq: Three times a day (TID) | ORAL | 0 refills | Status: DC

## 2020-03-07 MED ORDER — IPRATROPIUM BROMIDE 0.06 % NA SOLN: 2.0000 | Freq: Four times a day (QID) | NASAL | 0 refills | Status: DC

## 2020-03-07 MED ORDER — ALBUTEROL SULFATE HFA 108 (90 BASE) MCG/ACT IN AERS: 1.0000 | INHALATION_SPRAY | Freq: Four times a day (QID) | RESPIRATORY_TRACT | 0 refills | Status: DC | PRN

## 2020-03-07 MED ORDER — FLUTICASONE PROPIONATE 50 MCG/ACT NA SUSP: 2.0000 | Freq: Every day | NASAL | 0 refills | Status: DC

## 2020-03-07 NOTE — ED Provider Notes (Addendum)
[name]    CSN: [id number] Arrival date & time: 03/07/20  0834      History   Chief Complaint Chief Complaint  Patient presents with  . Cough    HPI Andrew Castillo is a 24 y.o. male.   24 year old male comes in for 2 day of URI symptoms. Has had cough, sore throat, chest tightness, rhinorrhea, nasal congestion. States feels short of breath when laying flat, similar to when he had asthma as a child. Denies fever, chills, body aches. Nausea without vomiting. Has had some diarrhea. Denies abdominal pain. Denies loss of taste/smell. Current every day smoker. otc cold medicine without relief. No COVID vaccine.      Past Medical History:  Diagnosis Date  . Childhood asthma   . Environmental allergies   . GERD (gastroesophageal reflux disease)   . Headache    migraines  . Seasonal allergic conjunctivitis     Patient Active Problem List   Diagnosis Date Noted  . Enteritis 10/06/2019  . Tobacco use disorder 06/24/2019  . Intractable nausea and vomiting 05/02/2016  . Esophageal reflux 05/02/2016  . Visit for preventive health examination 04/30/2016  . Seizure-like activity ([facility]) 04/30/2016  . Anxiety and depression 11/21/2015  . Gastroesophageal reflux disease with esophagitis 07/15/2015  . Inattention 07/15/2015  . RHINITIS, ALLERGIC 11/14/2006  . ASTHMA, UNSPECIFIED 11/14/2006  . ECZEMA, ATOPIC DERMATITIS 11/14/2006    Past Surgical History:  Procedure Laterality Date  . Left Knee Menuiscus     "several surguries on same knee"  . NM ESOPHAGEAL REFLUX    . Right Shoulder Surgery    . TONSILLECTOMY     adenoids       Home Medications    Prior to Admission medications   Medication Sig Start Date End Date Taking? Authorizing Provider  albuterol (VENTOLIN HFA) 108 (90 Base) MCG/ACT inhaler Inhale 1-2 puffs into the lungs every 6 (six) hours as needed for wheezing or shortness of breath. 03/07/20   Cathie Hoops, Kristelle Cavallaro V, PA-C  benzonatate (TESSALON) 200 MG  capsule Take 1 capsule (200 mg total) by mouth every 8 (eight) hours. 03/07/20   Cathie Hoops, Quenton Recendez V, PA-C  fluticasone (FLONASE) 50 MCG/ACT nasal spray Place 2 sprays into both nostrils daily. 03/07/20   Cathie Hoops, Daimen Shovlin V, PA-C  ibuprofen (ADVIL) 800 MG tablet Take 1 tablet (800 mg total) by mouth 3 (three) times daily. 02/11/20   Hall-Potvin, [place], PA-C  ipratropium (ATROVENT) 0.06 % nasal spray Place 2 sprays into both nostrils 4 (four) times daily. 03/07/20   Belinda Fisher, PA-C    Family History Family History  Problem Relation Age of Onset  . Hypertension Father   . Hyperlipidemia Father        Living  . Liver disease Father   . Fibromyalgia Mother   . Thyroid disease Mother        Living  . Lupus Mother   . Anxiety disorder Mother   . Clotting disorder Mother   . Crohn's disease Mother   . Bone cancer Paternal Grandmother   . Heart disease Paternal Grandfather   . Diabetes Paternal Grandfather   . Diabetes Maternal Grandmother   . Hypertension Maternal Grandmother   . Liver disease Maternal Grandmother   . Anxiety disorder Maternal Aunt   . Depression Maternal Aunt   . Lupus Maternal Aunt   . Diabetes Paternal Aunt   . Lupus Paternal Aunt   . Fibromyalgia Paternal Aunt   . Drug abuse Paternal  Uncle   . ADD / ADHD Brother   . Anxiety disorder Sister   . Migraines Other        Various  . Seizures Brother     Social History Social History   Tobacco Use  . Smoking status: Current Every Day Smoker    Packs/day: 0.50    Types: Cigarettes    Last attempt to quit: 04/17/2016    Years since quitting: 3.8  . Smokeless tobacco: Never Used  . Tobacco comment: 1 pack every 2-3 days  Vaping Use  . Vaping Use: Never used  Substance Use Topics  . Alcohol use: Yes    Alcohol/week: 0.0 standard drinks    Comment: socially  . Drug use: Yes    Types: Marijuana    Comment: daily     Allergies   Penicillins   Review of Systems Review of Systems  Reason unable to perform ROS: See HPI as  above.     Physical Exam Triage Vital Signs ED Triage Vitals [03/07/20 0850]  Enc Vitals Group     BP (!) 142/83     Pulse Rate 91     Resp 18     Temp 98.7 F (37.1 C)     Temp Source Oral     SpO2 95 %     Weight      Height      Head Circumference      Peak Flow      Pain Score 2     Pain Loc      Pain Edu?      Excl. in GC?    No data found.  Updated Vital Signs BP (!) 142/83 (BP Location: Left Arm)   Pulse 91   Temp 98.7 F (37.1 C) (Oral)   Resp 18   SpO2 95%   Physical Exam Constitutional:      General: He is not in acute distress.    Appearance: Normal appearance. He is not ill-appearing, toxic-appearing or diaphoretic.  HENT:     Head: Normocephalic and atraumatic.     Nose: Congestion and rhinorrhea present. Rhinorrhea is clear.     Mouth/Throat:     Mouth: Mucous membranes are moist.     Pharynx: Oropharynx is clear. Uvula midline.  Cardiovascular:     Rate and Rhythm: Normal rate and regular rhythm.     Heart sounds: Normal heart sounds. No murmur heard.  No friction rub. No gallop.   Pulmonary:     Effort: Pulmonary effort is normal. No accessory muscle usage, prolonged expiration, respiratory distress or retractions.     Comments: Lungs clear to auscultation without adventitious lung sounds. Chest:     Comments: Chest wall tenderness to palpation Musculoskeletal:     Cervical back: Normal range of motion and neck supple.  Neurological:     General: No focal deficit present.     Mental Status: He is alert and oriented to person, place, and time.      UC Treatments / Results  Labs (all labs ordered are listed, but only abnormal results are displayed) Labs Reviewed  NOVEL CORONAVIRUS, NAA    EKG   Radiology No results found.  Procedures Procedures (including critical care time)  Medications Ordered in UC Medications - No data to display  Initial Impression / Assessment and Plan / UC Course  I have reviewed the triage vital  signs and the nursing notes.  Pertinent labs & imaging results that were available during my  care of the patient were reviewed by me and considered in my medical decision making (see chart for details).    COVID PCR test ordered. Patient to quarantine until testing results return. No alarming signs on exam.  Patient speaking in full sentences without respiratory distress.  Symptomatic treatment discussed.  Push fluids.  Return precautions given.  Patient expresses understanding and agrees to plan.  Final Clinical Impressions(s) / UC Diagnoses   Final diagnoses:  Cough  Sore throat  Chest tightness   ED Prescriptions    Medication Sig Dispense Auth. Provider   fluticasone (FLONASE) 50 MCG/ACT nasal spray Place 2 sprays into both nostrils daily. 1 g Hawraa Stambaugh V, PA-C   ipratropium (ATROVENT) 0.06 % nasal spray Place 2 sprays into both nostrils 4 (four) times daily. 15 mL Evadne Ose V, PA-C   benzonatate (TESSALON) 200 MG capsule Take 1 capsule (200 mg total) by mouth every 8 (eight) hours. 21 capsule Regenia Erck V, PA-C   albuterol (VENTOLIN HFA) 108 (90 Base) MCG/ACT inhaler Inhale 1-2 puffs into the lungs every 6 (six) hours as needed for wheezing or shortness of breath. 8 g Ok Edwards, PA-C     PDMP not reviewed this encounter.   Ok Edwards, PA-C 03/07/20 [place], Wilbur Labuda V, PA-C 03/07/20 [phone]

## 2020-03-07 NOTE — ED Triage Notes (Signed)
Pt c/o cough, sore throat, chest tightness/congestion, and SOB since yesterday. Pt states has a ? Spider bite with a red area note to rt upper shoulder that was noticed before all his sx's started.

## 2020-03-07 NOTE — Discharge Instructions (Signed)
COVID PCR testing ordered. I would like you to quarantine until testing results. Tessalon for cough. Albuterol as needed for shortness of breath. Start flonase, atrovent nasal spray for nasal congestion/drainage. You can use over the counter nasal saline rinse such as neti pot for nasal congestion. Keep hydrated, your urine should be clear to pale yellow in color. Tylenol/motrin for fever and pain. Monitor for any worsening of symptoms, chest pain, shortness of breath, wheezing, swelling of the throat, go to the emergency department for further evaluation needed.

## 2020-03-08 LAB — NOVEL CORONAVIRUS, NAA: SARS-CoV-2, NAA: NOT DETECTED

## 2020-03-08 LAB — SARS-COV-2, NAA 2 DAY TAT

## 2020-05-18 ENCOUNTER — Telehealth: Payer: Self-pay | Admitting: Physician Assistant

## 2020-05-18 NOTE — Telephone Encounter (Signed)
Pt called stating he was experiencing testicle pain. Rated the pain an 8 out of 10. Transferred pt to [facility].

## 2020-05-18 NOTE — Telephone Encounter (Signed)
[facility] TELEPHONE ADVICE RECORD [facility] Patient Name: Andrew Castillo Gender: Male DOB: [birth year] Age: 24 Y 7 M 2 D Return Phone Number: [phone] (Primary) Address: City/State/Zip: [place] [address] Client [facility] - Bonne Dolores Client Site [facility] - Day Physician Malva Cogan - [place] Contact Type Call Who Is Calling Patient / Member / Family / Caregiver Call Type Triage / Clinical Relationship To Patient Self Return Phone Number [phone] (Primary) Chief Complaint TESTICULAR - sudden pain Reason for Call Symptomatic / Request for Health Information Initial Comment Caller states is [name] w/[facility] - Pt is having testicle pain and nausea. Translation No Nurse Assessment Nurse: Cox, RN, [employer] Date/Time (Eastern Time): 05/18/2020 3:02:07 PM Confirm and document reason for call. If symptomatic, describe symptoms. ---Caller states he has testicular pain and nausea x 2 hours, no injury Has the patient had close contact with a person known or suspected to have the novel coronavirus illness OR traveled / lives in area with major community spread (including international travel) in the last 14 days from the onset of symptoms? * If Asymptomatic, screen for exposure and travel within the last 14 days. ---No Does the patient have any new or worsening symptoms? ---Yes Will a triage be completed? ---Yes Related visit to physician within the last 2 weeks? ---No Does the PT have any chronic conditions? (i.e. diabetes, asthma, this includes High risk factors for pregnancy, etc.) ---No Is this a behavioral health or substance abuse call? ---No Guidelines Guideline Title Affirmed Question Affirmed Notes Nurse Date/Time Lamount Cohen Time) Scrotal Pain SEVERE pain (e.g., excruciating) Cox, RN, [employer] 05/18/2020 3:03:15 PM Disp. Time Lamount Cohen Time) Disposition Final  User 05/18/2020 2:57:44 PM Send to Urgent Franchot Gallo 05/18/2020 3:04:32 PM Go to ED Now Yes Cox, RN, AlliconPLEASE NOTE: All timestamps contained within this report are represented as [place] Standard Time. CONFIDENTIALTY NOTICE: This fax transmission is intended only for the addressee. It contains information that is legally privileged, confidential or otherwise protected from use or disclosure. If you are not the intended recipient, you are strictly prohibited from reviewing, disclosing, copying using or disseminating any of this information or taking any action in reliance on or regarding this information. If you have received this fax in error, please notify us immediately by telephone so that we can arrange for its return to [place]. Phone: [phone], Toll-Free: [phone], Fax: [phone] Page: 2 of 2 Call Id: [id number] Caller Disagree/Comply Comply Caller Understands Yes PreDisposition Did not know what to do Care Advice Given Per Guideline GO TO ED NOW: * You need to be seen in the Emergency Department. ANOTHER ADULT SHOULD DRIVE: CARE ADVICE given per Scrotal Pain (Adult) guideline. Referrals [facility] - ED

## 2020-08-08 ENCOUNTER — Ambulatory Visit
Admission: EM | Admit: 2020-08-08 | Discharge: 2020-08-08 | Disposition: A | Discharge status: Home / Self Care | Payer: Managed Care, Other (non HMO) | Attending: Emergency Medicine | Admitting: Emergency Medicine

## 2020-08-08 DIAGNOSIS — J069 Acute upper respiratory infection, unspecified: Secondary | ICD-10-CM

## 2020-08-08 DIAGNOSIS — Z20822 Contact with and (suspected) exposure to covid-19: Secondary | ICD-10-CM

## 2020-08-08 MED ORDER — BENZONATATE 100 MG PO CAPS: 100.0000 mg | ORAL_CAPSULE | Freq: Three times a day (TID) | ORAL | 0 refills | Status: DC

## 2020-08-08 NOTE — Discharge Instructions (Addendum)

## 2020-08-08 NOTE — ED Triage Notes (Signed)
Pt states had a positive covid exposure on Friday. States on Saturday developed fever 103., productive cough with green sputum, and neck pain.

## 2020-08-08 NOTE — ED Provider Notes (Signed)
[name]    CSN: [id number] Arrival date & time: 08/08/20  5462      History   Chief Complaint Chief Complaint  Patient presents with  . Cough    HPI Andrew Castillo is a 24 y.o. male  Presenting for Covid testing: Exposure: father who was exposed to a coworker Date of exposure: daily during work week Any fever, symptoms since exposure: yes - fever, productive cough, neck pain.  Dayquil and Nyquil help.  No CP, SOB.  Past Medical History:  Diagnosis Date  . Childhood asthma   . Environmental allergies   . GERD (gastroesophageal reflux disease)   . Headache    migraines  . Seasonal allergic conjunctivitis     Patient Active Problem List   Diagnosis Date Noted  . Enteritis 10/06/2019  . Tobacco use disorder 06/24/2019  . Intractable nausea and vomiting 05/02/2016  . Esophageal reflux 05/02/2016  . Visit for preventive health examination 04/30/2016  . Seizure-like activity (HCC) 04/30/2016  . Anxiety and depression 11/21/2015  . Gastroesophageal reflux disease with esophagitis 07/15/2015  . Inattention 07/15/2015  . RHINITIS, ALLERGIC 11/14/2006  . ASTHMA, UNSPECIFIED 11/14/2006  . ECZEMA, ATOPIC DERMATITIS 11/14/2006    Past Surgical History:  Procedure Laterality Date  . Left Knee Menuiscus     "several surguries on same knee"  . NM ESOPHAGEAL REFLUX    . Right Shoulder Surgery    . TONSILLECTOMY     adenoids       Home Medications    Prior to Admission medications   Medication Sig Start Date End Date Taking? Authorizing Provider  benzonatate (TESSALON) 100 MG capsule Take 1 capsule (100 mg total) by mouth every 8 (eight) hours. 08/08/20   Hall-Potvin, [place], PA-C    Family History Family History  Problem Relation Age of Onset  . Hypertension Father   . Hyperlipidemia Father        Living  . Liver disease Father   . Fibromyalgia Mother   . Thyroid disease Mother        Living  . Lupus Mother   . Anxiety disorder  Mother   . Clotting disorder Mother   . Crohn's disease Mother   . Bone cancer Paternal Grandmother   . Heart disease Paternal Grandfather   . Diabetes Paternal Grandfather   . Diabetes Maternal Grandmother   . Hypertension Maternal Grandmother   . Liver disease Maternal Grandmother   . Anxiety disorder Maternal Aunt   . Depression Maternal Aunt   . Lupus Maternal Aunt   . Diabetes Paternal Aunt   . Lupus Paternal Aunt   . Fibromyalgia Paternal Aunt   . Drug abuse Paternal Uncle   . ADD / ADHD Brother   . Anxiety disorder Sister   . Migraines Other        Various  . Seizures Brother     Social History Social History   Tobacco Use  . Smoking status: Current Every Day Smoker    Packs/day: 0.50    Types: Cigarettes    Last attempt to quit: 04/17/2016    Years since quitting: 4.3  . Smokeless tobacco: Never Used  . Tobacco comment: 1 pack every 2-3 days  Vaping Use  . Vaping Use: Never used  Substance Use Topics  . Alcohol use: Yes    Alcohol/week: 0.0 standard drinks    Comment: socially  . Drug use: Yes    Types: Marijuana    Comment: daily  Allergies   Penicillins   Review of Systems Review of Systems  Constitutional: Negative for fatigue and fever.  HENT: Positive for congestion and postnasal drip. Negative for sore throat and voice change.   Respiratory: Positive for cough. Negative for shortness of breath and wheezing.   Cardiovascular: Negative for chest pain and palpitations.  Gastrointestinal: Negative for abdominal pain, diarrhea and vomiting.  Musculoskeletal: Positive for myalgias. Negative for arthralgias.  Skin: Negative for rash and wound.  Neurological: Negative for speech difficulty and headaches.  All other systems reviewed and are negative.    Physical Exam Triage Vital Signs ED Triage Vitals  Enc Vitals Group     BP 08/08/20 0905 133/81     Pulse Rate 08/08/20 0905 78     Resp 08/08/20 0905 16     Temp 08/08/20 0905 98.1 F (36.7  C)     Temp Source 08/08/20 0905 Oral     SpO2 08/08/20 0905 95 %     Weight --      Height --      Head Circumference --      Peak Flow --      Pain Score 08/08/20 0914 2     Pain Loc --      Pain Edu? --      Excl. in GC? --    No data found.  Updated Vital Signs BP 133/81 (BP Location: Left Arm)   Pulse 78   Temp 98.1 F (36.7 C) (Oral)   Resp 16   SpO2 95%   Visual Acuity Right Eye Distance:   Left Eye Distance:   Bilateral Distance:    Right Eye Near:   Left Eye Near:    Bilateral Near:     Physical Exam Constitutional:      General: He is not in acute distress.    Appearance: He is not toxic-appearing or diaphoretic.  HENT:     Head: Normocephalic and atraumatic.     Mouth/Throat:     Mouth: Mucous membranes are moist.     Pharynx: Oropharynx is clear.  Eyes:     General: No scleral icterus.    Conjunctiva/sclera: Conjunctivae normal.     Pupils: Pupils are equal, round, and reactive to light.  Neck:     Comments: Trachea midline, negative JVD Cardiovascular:     Rate and Rhythm: Normal rate and regular rhythm.  Pulmonary:     Effort: Pulmonary effort is normal. No respiratory distress.     Breath sounds: No wheezing.  Musculoskeletal:     Cervical back: Neck supple. No tenderness.  Lymphadenopathy:     Cervical: No cervical adenopathy.  Skin:    Capillary Refill: Capillary refill takes less than 2 seconds.     Coloration: Skin is not jaundiced or pale.     Findings: No rash.  Neurological:     Mental Status: He is alert and oriented to person, place, and time.      UC Treatments / Results  Labs (all labs ordered are listed, but only abnormal results are displayed) Labs Reviewed  NOVEL CORONAVIRUS, NAA    EKG   Radiology No results found.  Procedures Procedures (including critical care time)  Medications Ordered in UC Medications - No data to display  Initial Impression / Assessment and Plan / UC Course  I have reviewed the  triage vital signs and the nursing notes.  Pertinent labs & imaging results that were available during my care of the patient were  reviewed by me and considered in my medical decision making (see chart for details).     Patient afebrile, nontoxic, with SpO2 95%.  Covid PCR pending.  Patient to quarantine until results are back given possible secondary exposure.  We will treat supportively as outlined below.  Return precautions discussed, patient verbalized understanding and is agreeable to plan. Final Clinical Impressions(s) / UC Diagnoses   Final diagnoses:  Encounter for screening laboratory testing for COVID-19 virus  URI with cough and congestion     Discharge Instructions     Tessalon for cough. Start flonase, atrovent nasal spray for nasal congestion/drainage. You can use over the counter nasal saline rinse such as neti pot for nasal congestion. Keep hydrated, your urine should be clear to pale yellow in color. Tylenol/motrin for fever and pain. Monitor for any worsening of symptoms, chest pain, shortness of breath, wheezing, swelling of the throat, go to the emergency department for further evaluation needed.     ED Prescriptions    Medication Sig Dispense Auth. Provider   benzonatate (TESSALON) 100 MG capsule Take 1 capsule (100 mg total) by mouth every 8 (eight) hours. 21 capsule Hall-Potvin, [place], PA-C     PDMP not reviewed this encounter.   Hall-Potvin, [place], [place] 08/08/20 [phone]

## 2020-08-10 LAB — NOVEL CORONAVIRUS, NAA: SARS-CoV-2, NAA: NOT DETECTED

## 2020-08-10 LAB — SARS-COV-2, NAA 2 DAY TAT

## 2020-12-20 ENCOUNTER — Encounter: Payer: Self-pay | Admitting: Internal Medicine

## 2020-12-20 ENCOUNTER — Encounter: Payer: Managed Care, Other (non HMO) | Admitting: Internal Medicine

## 2020-12-22 ENCOUNTER — Encounter: Payer: Self-pay | Admitting: Internal Medicine

## 2020-12-29 ENCOUNTER — Ambulatory Visit
Admission: EM | Admit: 2020-12-29 | Discharge: 2020-12-29 | Disposition: A | Discharge status: Home / Self Care | Payer: Managed Care, Other (non HMO) | Attending: Family Medicine | Admitting: Family Medicine

## 2020-12-29 DIAGNOSIS — H66001 Acute suppurative otitis media without spontaneous rupture of ear drum, right ear: Secondary | ICD-10-CM

## 2020-12-29 DIAGNOSIS — Z760 Encounter for issue of repeat prescription: Secondary | ICD-10-CM

## 2020-12-29 DIAGNOSIS — J0140 Acute pansinusitis, unspecified: Secondary | ICD-10-CM

## 2020-12-29 DIAGNOSIS — J302 Other seasonal allergic rhinitis: Secondary | ICD-10-CM

## 2020-12-29 MED ORDER — AZITHROMYCIN 250 MG PO TABS: ORAL_TABLET | ORAL | 0 refills | Status: DC

## 2020-12-29 MED ORDER — LEVOCETIRIZINE DIHYDROCHLORIDE 5 MG PO TABS: 5.0000 mg | ORAL_TABLET | Freq: Every evening | ORAL | 0 refills | Status: DC

## 2020-12-29 MED ORDER — FLUTICASONE PROPIONATE 50 MCG/ACT NA SUSP: 2.0000 | Freq: Every day | NASAL | 0 refills | Status: DC

## 2020-12-29 MED ORDER — ALBUTEROL SULFATE HFA 108 (90 BASE) MCG/ACT IN AERS: 1.0000 | INHALATION_SPRAY | Freq: Four times a day (QID) | RESPIRATORY_TRACT | 0 refills | Status: DC | PRN

## 2020-12-29 NOTE — ED Triage Notes (Signed)
Pt c/o nasal and sinus congestion x2wks. C/o rt ear pain with muffled sound since yesterday.

## 2020-12-29 NOTE — ED Provider Notes (Signed)
[name]    CSN: [id number] Arrival date & time: 12/29/20  1340      History   Chief Complaint Chief Complaint  Patient presents with  . Nasal Congestion  . Otalgia    HPI Andrew Castillo is a 25 y.o. male.   HPI  Patient presents with URI symptoms including cough, sore throat, otalgia, nasal congestion, runny nose, and sinus pressure x 2 weeks. Denies worrisome symptoms of shortness of breath,wheezing, fever. Taking OTC medication without improvement. He is a current smoker  Past Medical History:  Diagnosis Date  . Childhood asthma   . Environmental allergies   . GERD (gastroesophageal reflux disease)   . Headache    migraines  . Seasonal allergic conjunctivitis     Patient Active Problem List   Diagnosis Date Noted  . Enteritis 10/06/2019  . Tobacco use disorder 06/24/2019  . Intractable nausea and vomiting 05/02/2016  . Esophageal reflux 05/02/2016  . Visit for preventive health examination 04/30/2016  . Seizure-like activity (HCC) 04/30/2016  . Anxiety and depression 11/21/2015  . Gastroesophageal reflux disease with esophagitis 07/15/2015  . Inattention 07/15/2015  . RHINITIS, ALLERGIC 11/14/2006  . ASTHMA, UNSPECIFIED 11/14/2006  . ECZEMA, ATOPIC DERMATITIS 11/14/2006    Past Surgical History:  Procedure Laterality Date  . Left Knee Menuiscus     "several surguries on same knee"  . NM ESOPHAGEAL REFLUX    . Right Shoulder Surgery    . TONSILLECTOMY     adenoids       Home Medications    Prior to Admission medications   Medication Sig Start Date End Date Taking? Authorizing Provider  albuterol (VENTOLIN HFA) 108 (90 Base) MCG/ACT inhaler Inhale 1-2 puffs into the lungs every 6 (six) hours as needed for wheezing or shortness of breath. 12/29/20  Yes Bing Neighbors, FNP  azithromycin (ZITHROMAX) 250 MG tablet Take 2 tabs PO x 1 dose, then 1 tab PO QD x 4 days 12/29/20  Yes Bing Neighbors, FNP  fluticasone (FLONASE) 50  MCG/ACT nasal spray Place 2 sprays into both nostrils daily. 12/29/20  Yes Bing Neighbors, FNP  levocetirizine (XYZAL) 5 MG tablet Take 1 tablet (5 mg total) by mouth every evening. 12/29/20  Yes Bing Neighbors, FNP    Family History Family History  Problem Relation Age of Onset  . Hypertension Father   . Hyperlipidemia Father        Living  . Liver disease Father   . Fibromyalgia Mother   . Thyroid disease Mother        Living  . Lupus Mother   . Anxiety disorder Mother   . Clotting disorder Mother   . Crohn's disease Mother   . Bone cancer Paternal Grandmother   . Heart disease Paternal Grandfather   . Diabetes Paternal Grandfather   . Diabetes Maternal Grandmother   . Hypertension Maternal Grandmother   . Liver disease Maternal Grandmother   . Anxiety disorder Maternal Aunt   . Depression Maternal Aunt   . Lupus Maternal Aunt   . Diabetes Paternal Aunt   . Lupus Paternal Aunt   . Fibromyalgia Paternal Aunt   . Drug abuse Paternal Uncle   . ADD / ADHD Brother   . Anxiety disorder Sister   . Migraines Other        Various  . Seizures Brother     Social History Social History   Tobacco Use  . Smoking status: Current Every Day Smoker  Packs/day: 0.50    Types: Cigarettes    Last attempt to quit: 04/17/2016    Years since quitting: 4.7  . Smokeless tobacco: Never Used  . Tobacco comment: 1 pack every 2-3 days  Vaping Use  . Vaping Use: Never used  Substance Use Topics  . Alcohol use: Yes    Alcohol/week: 0.0 standard drinks    Comment: socially  . Drug use: Yes    Types: Marijuana    Comment: daily     Allergies   Penicillins   Review of Systems Review of Systems Pertinent negatives listed in HPI  Physical Exam Triage Vital Signs ED Triage Vitals [12/29/20 1454]  Enc Vitals Group     BP 130/74     Pulse Rate 80     Resp 18     Temp 98.2 F (36.8 C)     Temp Source Oral     SpO2 96 %     Weight      Height      Head Circumference       Peak Flow      Pain Score 2     Pain Loc      Pain Edu?      Excl. in GC?    No data found.  Updated Vital Signs BP 130/74 (BP Location: Left Arm)   Pulse 80   Temp 98.2 F (36.8 C) (Oral)   Resp 18   SpO2 96%   Visual Acuity Right Eye Distance:   Left Eye Distance:   Bilateral Distance:    Right Eye Near:   Left Eye Near:    Bilateral Near:     Physical Exam  General Appearance:    Alert, cooperative, no distress  HENT:   Normocephalic, ears normal, nares mucosal edema with congestion, rhinorrhea, oropharynx patent    Eyes:    PERRL, conjunctiva/corneas clear, EOM's intact       Lungs:     Clear to auscultation bilaterally, respirations unlabored  Heart:    Regular rate and rhythm  Neurologic:   Awake, alert, oriented x 3. No apparent focal neurological           defect.     UC Treatments / Results  Labs (all labs ordered are listed, but only abnormal results are displayed) Labs Reviewed - No data to display  EKG   Radiology No results found.  Procedures Procedures (including critical care time)  Medications Ordered in UC Medications - No data to display  Initial Impression / Assessment and Plan / UC Course  I have reviewed the triage vital signs and the nursing notes.  Pertinent labs & imaging results that were available during my care of the patient were reviewed by me and considered in my medical decision making (see chart for details).    Treatment today for nonrecurrent otitis media of the right ear, nonrecurrent pansinusitis, seasonal allergies and refilled inhaler.  Treatment prescribed per discharge medications.  Follow-up with primary care provider as needed.  ER if breathing becomes severe or you develop persistent wheezing. Final Clinical Impressions(s) / UC Diagnoses   Final diagnoses:  Non-recurrent acute suppurative otitis media of right ear without spontaneous rupture of tympanic membrane  Seasonal allergies  Acute non-recurrent  pansinusitis  Medication refill   Discharge Instructions   None    ED Prescriptions    Medication Sig Dispense Auth. Provider   albuterol (VENTOLIN HFA) 108 (90 Base) MCG/ACT inhaler Inhale 1-2 puffs into the lungs every  6 (six) hours as needed for wheezing or shortness of breath. 8 g Bing Neighbors, FNP   levocetirizine (XYZAL) 5 MG tablet Take 1 tablet (5 mg total) by mouth every evening. 90 tablet Bing Neighbors, FNP   fluticasone (FLONASE) 50 MCG/ACT nasal spray Place 2 sprays into both nostrils daily. 16 g Bing Neighbors, FNP   azithromycin (ZITHROMAX) 250 MG tablet Take 2 tabs PO x 1 dose, then 1 tab PO QD x 4 days 6 tablet Bing Neighbors, FNP     PDMP not reviewed this encounter.   Bing Neighbors, FNP 01/02/21 2100

## 2021-05-22 IMAGING — DX DG CHEST 1V PORT
1 series · 1 of 1 positions shown · non-contrast
Comparison: 9096

CLINICAL DATA: Shortness of breath

EXAM:
PORTABLE CHEST 1 VIEW

[chest ap]
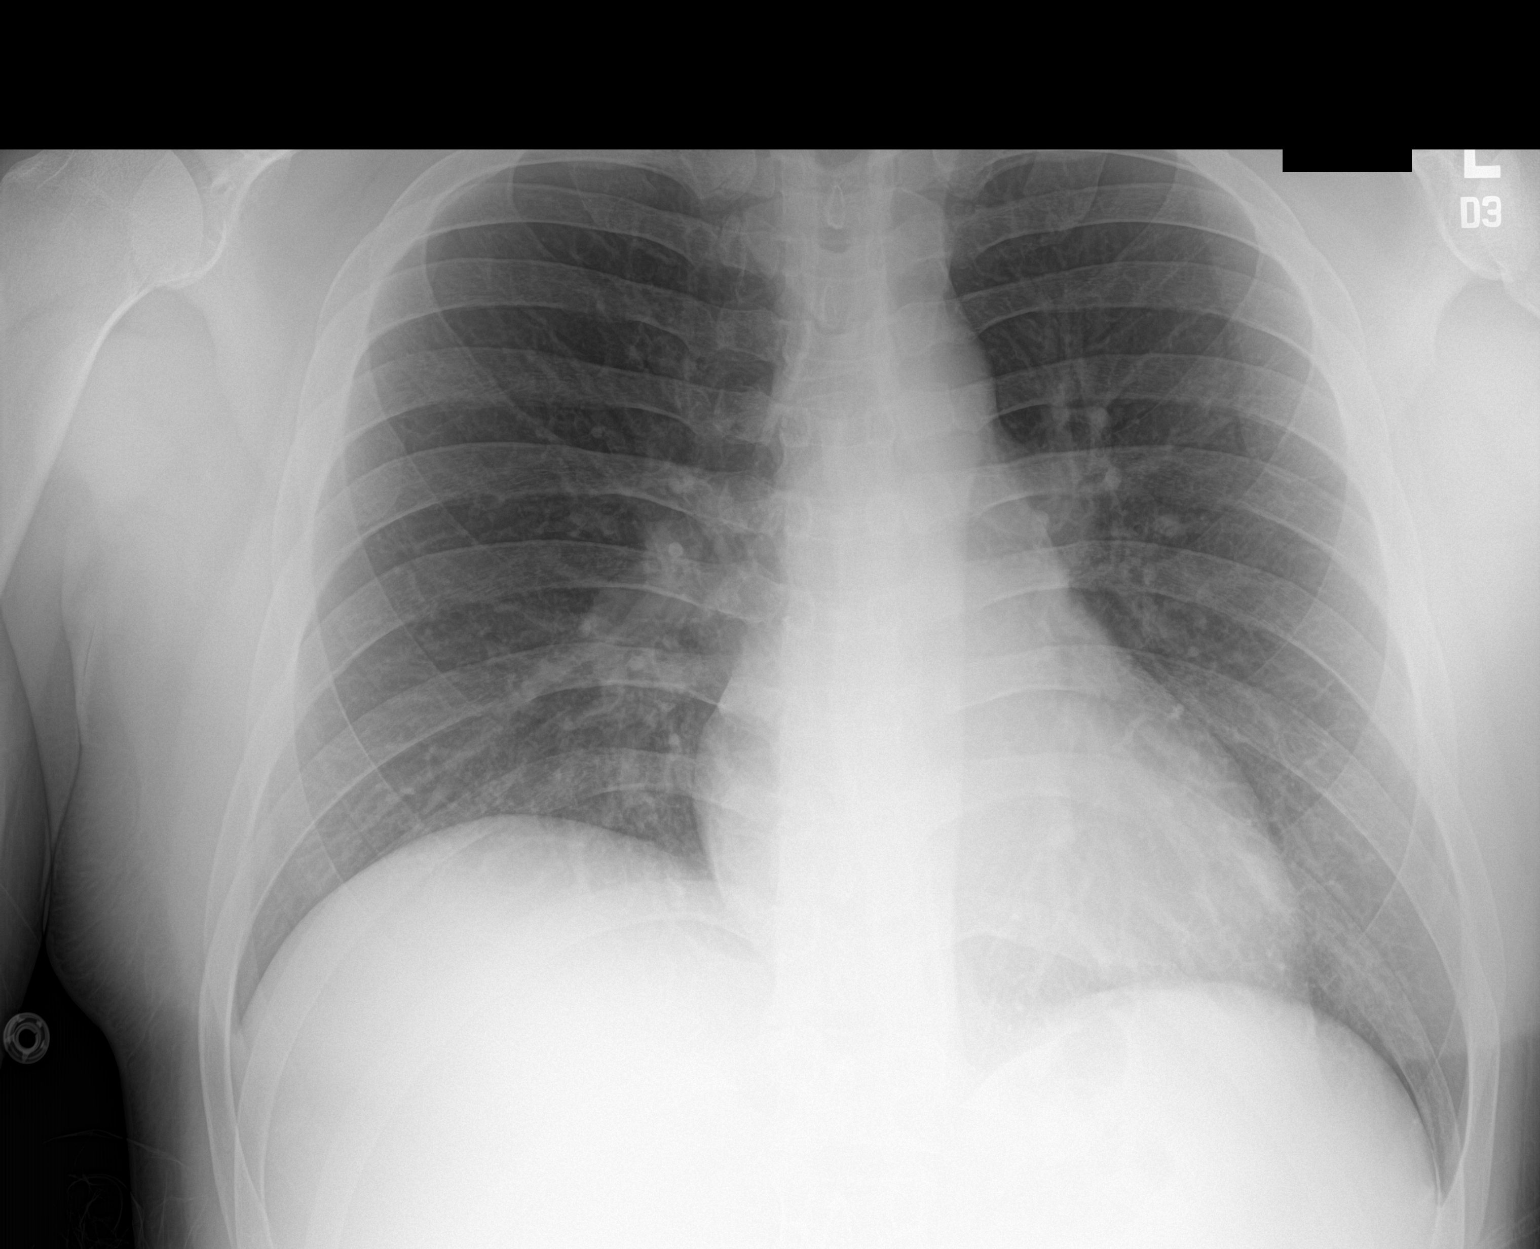

[1 of 1 positions shown; findings below may reference images not displayed]

FINDINGS: The heart size and mediastinal contours are within normal limits.
Both lungs are clear. No pleural effusion. No pneumothorax. The
visualized skeletal structures are unremarkable.
IMPRESSION: No acute process in the chest.

## 2021-05-22 IMAGING — CT CT ABD-PELV W/ CM
2 of 4 series · 16 of 46 positions shown, 18 images · IV contrast (APPLIED)
Comparison: September 25, 2018

CLINICAL DATA: Abdominal pain with vomiting and nausea

EXAM:
CT ABDOMEN AND PELVIS WITH CONTRAST
TECHNIQUE: Multidetector CT imaging of the abdomen and pelvis was performed
using the standard protocol following bolus administration of
intravenous contrast.
CONTRAST:  100mL OMNIPAQUE IOHEXOL 300 MG/ML  SOLN

[Series 3: abd/ pelvis 5.0 i30f 2 · axial · 0.85mm/px · z∈[+710,+1190]mm · 13 of 106 slices shown, 15 images]
[im 5/106  soft-tissue]
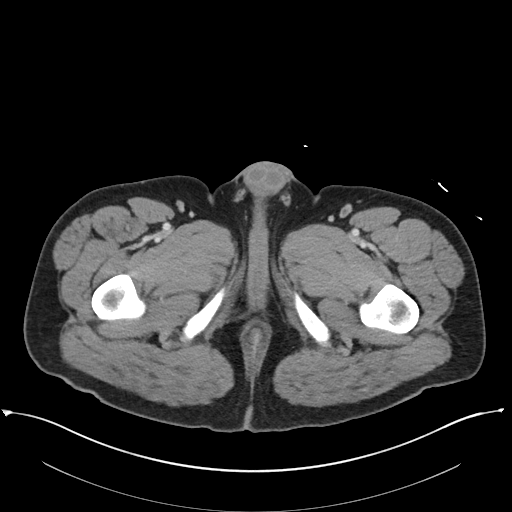
[im 5/106  bone]
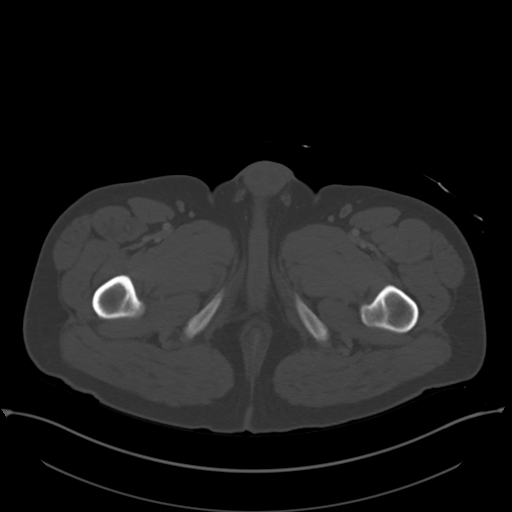
[im 14/106  soft-tissue]
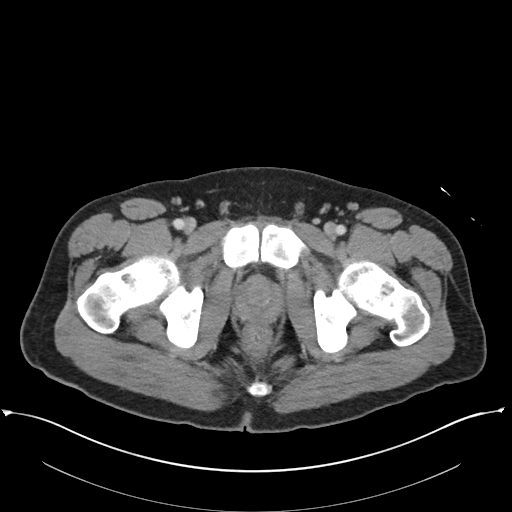
[im 22/106  soft-tissue]
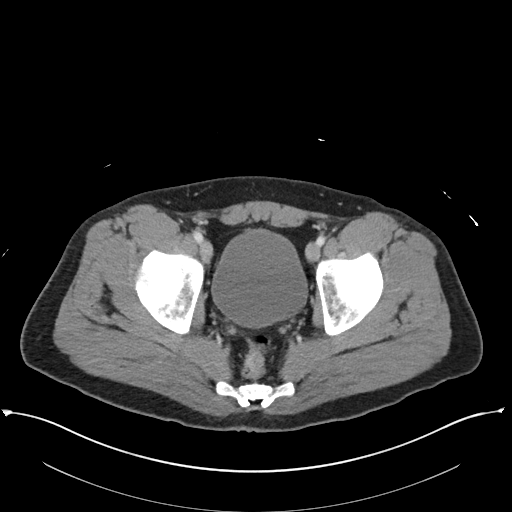
[im 31/106  soft-tissue]
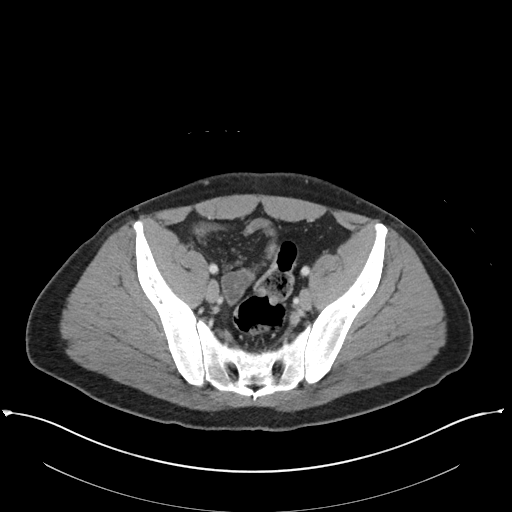
[im 36/106  soft-tissue]
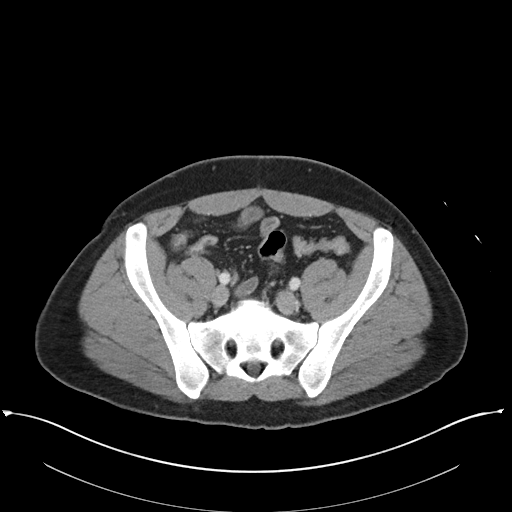
[im 44/106  soft-tissue]
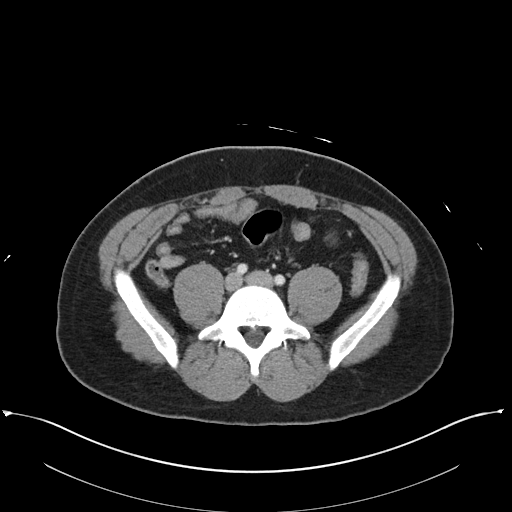
[im 53/106  soft-tissue]
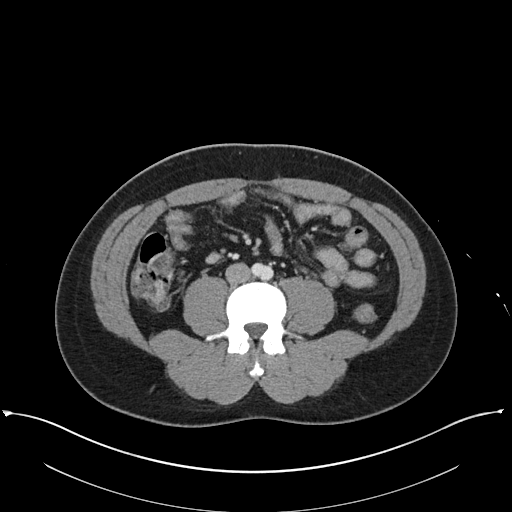
[im 62/106  soft-tissue]
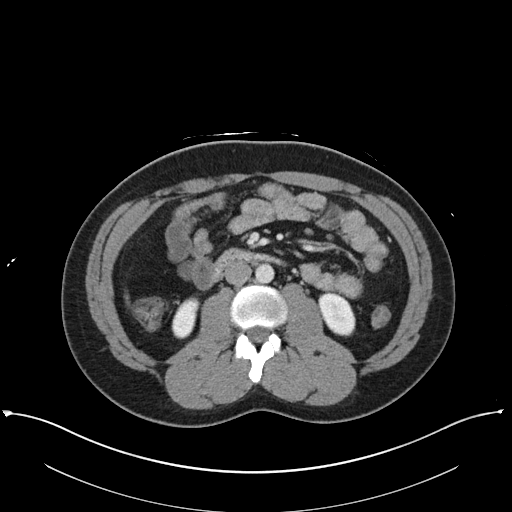
[im 71/106  soft-tissue]
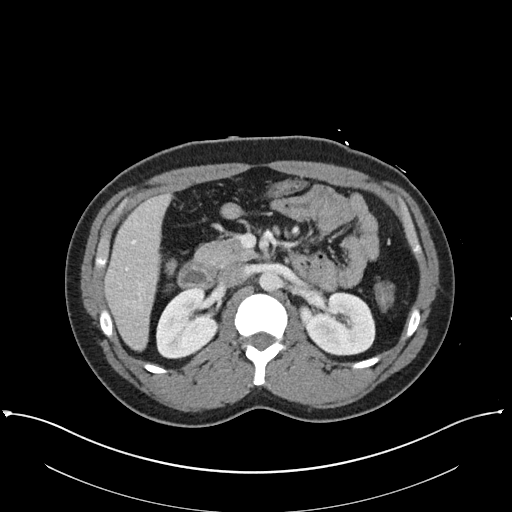
[im 71/106  bone]
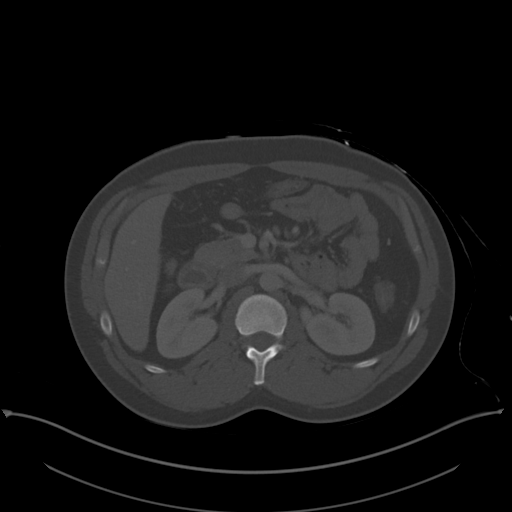
[im 75/106  soft-tissue]
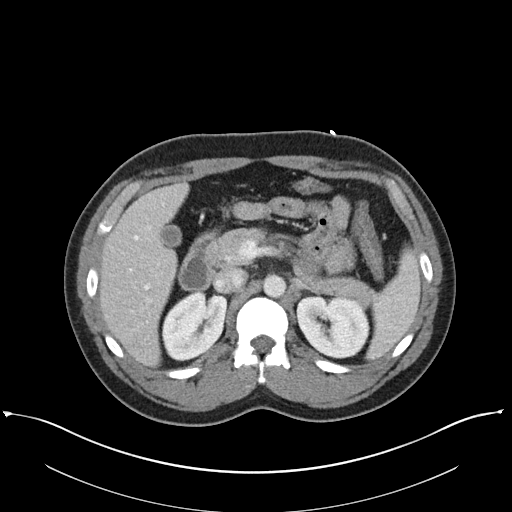
[im 84/106  soft-tissue]
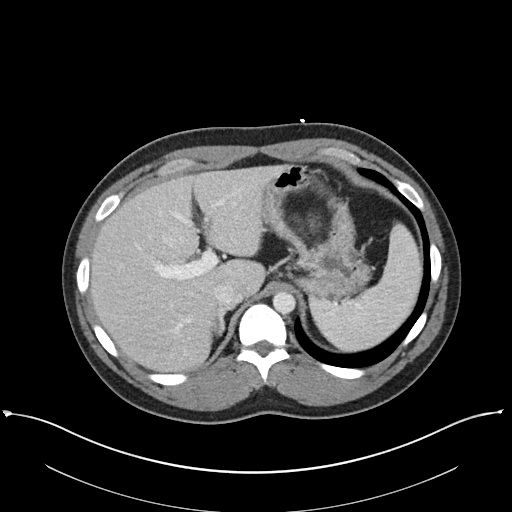
[im 92/106  soft-tissue]
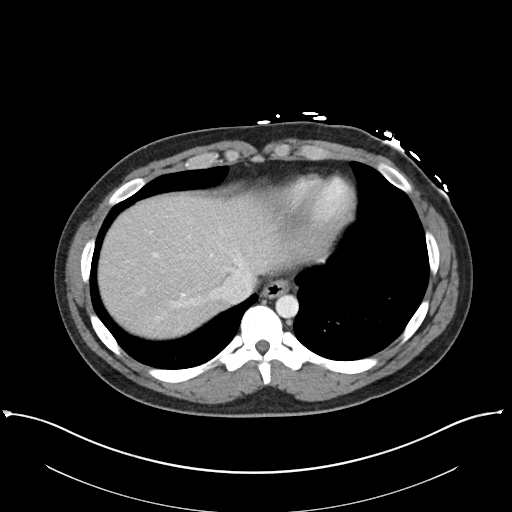
[im 101/106  soft-tissue]
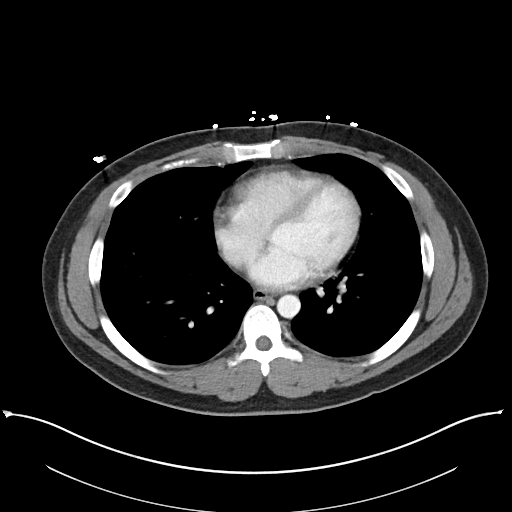

[Series 6: coronal soft tissue · coronal · 0.89mm/px · 3 of 101 slices shown]
[im 34/101  soft-tissue]
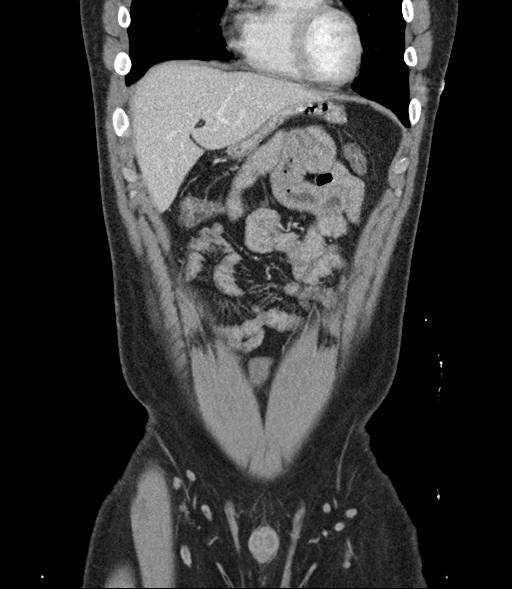
[im 45/101  soft-tissue]
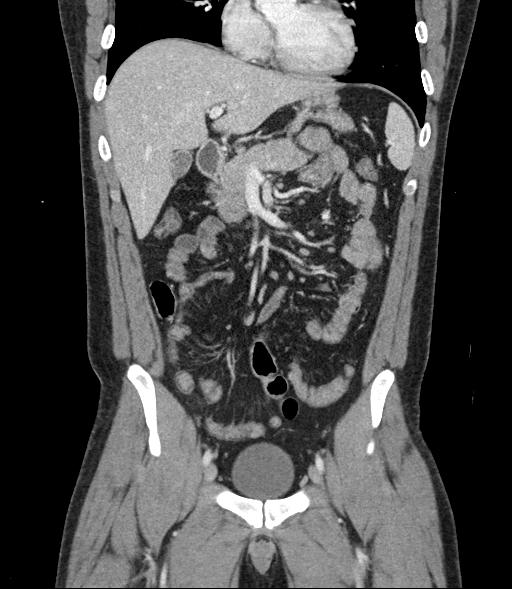
[im 56/101  soft-tissue]
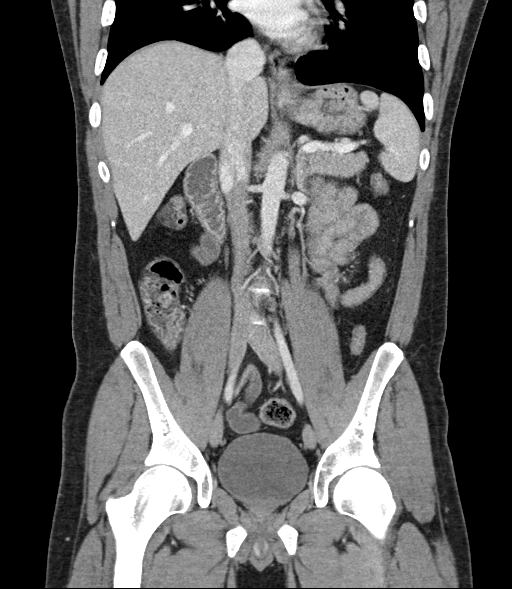

[16 of 46 positions shown; findings below may reference images not displayed]

FINDINGS: Lower chest: Lung bases are clear.

Hepatobiliary: No focal liver lesions are evident. The gallbladder
wall is not appreciably thickened. There is no biliary duct
dilatation.

Pancreas: There is no pancreatic mass or inflammatory focus.

Spleen: No splenic lesions are evident.

Adrenals/Urinary Tract: Adrenals bilaterally appear unremarkable.
Kidneys bilaterally show no evident mass or hydronephrosis on either
side. There is no evident renal or ureteral calculus on either side.
Urinary bladder is midline with wall thickness within normal limits.

Stomach/Bowel: There is no appreciable bowel wall or mesenteric
thickening. Note that colon is nearly empty. There is no evident
bowel obstruction. Terminal ileum appears unremarkable. There is no
evident free air or portal venous air.

Vascular/Lymphatic: No abdominal aortic aneurysm. No vascular
lesions are appreciable. By size criteria, there is no adenopathy in
the abdomen or pelvis. There are subcentimeter inguinal lymph nodes
as well as several subcentimeter lymph nodes in the right lower
quadrant region. The largest of these lymph nodes in the right lower
quadrant has a short axis diameter of 9 mm, upper normal in size.

Reproductive: Prostate and seminal vesicles are normal in size and
contour. No evident pelvic mass.

Other: The appendix appears normal. No evident abscess or ascites in
the abdomen or pelvis.

Musculoskeletal: No blastic or lytic bone lesions evident. There is
no intramuscular or abdominal wall lesions.
IMPRESSION: 1. Several subcentimeter lymph nodes in the right lower quadrant. In
the appropriate clinical setting, this finding could indicate a
degree of mesenteric adenitis. No frank adenopathy in the abdomen or
pelvis evident by size criteria.

2. No evident bowel obstruction. No abscess in the abdomen or
pelvis. The appendix appears normal.

3. No evident renal or ureteral calculus. No hydronephrosis. Urinary
bladder wall thickness is within normal limits.

## 2021-07-27 ENCOUNTER — Ambulatory Visit
Admission: EM | Admit: 2021-07-27 | Discharge: 2021-07-27 | Disposition: A | Discharge status: Home / Self Care | Payer: Managed Care, Other (non HMO) | Attending: Internal Medicine | Admitting: Internal Medicine

## 2021-07-27 ENCOUNTER — Other Ambulatory Visit: Payer: Self-pay

## 2021-07-27 DIAGNOSIS — J069 Acute upper respiratory infection, unspecified: Secondary | ICD-10-CM

## 2021-07-27 DIAGNOSIS — Z20822 Contact with and (suspected) exposure to covid-19: Secondary | ICD-10-CM | POA: Diagnosis not present

## 2021-07-27 DIAGNOSIS — R6889 Other general symptoms and signs: Secondary | ICD-10-CM | POA: Diagnosis not present

## 2021-07-27 MED ORDER — ALBUTEROL SULFATE HFA 108 (90 BASE) MCG/ACT IN AERS: 1.0000 | INHALATION_SPRAY | Freq: Four times a day (QID) | RESPIRATORY_TRACT | 0 refills | Status: DC | PRN

## 2021-07-27 NOTE — Discharge Instructions (Signed)
You likely having a viral upper respiratory infection. We recommended symptom control. I expect your symptoms to start improving in the next 1-2 weeks.   1. Take a daily allergy pill/anti-histamine like Zyrtec, Claritin, or Store brand consistently for 2 weeks  2. For congestion you may try an oral decongestant like Mucinex or sudafed. You may also try intranasal flonase nasal spray or saline irrigations (neti pot, sinus cleanse)  3. For your sore throat you may try cepacol lozenges, salt water gargles, throat spray. Treatment of congestion may also help your sore throat.  4. For cough you may try Robitussen, Mucinex DM  5. Take Tylenol or Ibuprofen to help with pain/inflammation  6. Stay hydrated, drink plenty of fluids to keep throat coated and less irritated  Honey Tea For cough/sore throat try using a honey-based tea. Use 3 teaspoons of honey with juice squeezed from half lemon. Place shaved pieces of ginger into 1/2-1 cup of water and warm over stove top. Then mix the ingredients and repeat every 4 hours as needed.   Your COVID and flu test are pending.  We will call if they are positive.  Your albuterol inhaler has been refilled.

## 2021-07-27 NOTE — ED Provider Notes (Signed)
[name]    CSN: [id number] Arrival date & time: 07/27/21  [id number]      History   Chief Complaint Chief Complaint  Patient presents with   Nasal Congestion    HPI Andrew Castillo is a 25 y.o. male.   Patient presents with nasal congestion, chills, cough that started yesterday.  Denies any known fevers.  Patient reports that he has recently been exposed to an employer at his workplace and tested positive for COVID-19.  Has taken Mucinex DM with improvement in symptoms.  Denies chest pain, shortness of breath, nausea, vomiting, diarrhea, abdominal pain.  Patient also requesting refill on his albuterol inhaler.  Patient reports that he has asthma exacerbations with acute illnesses but denies any current shortness of breath.  Last refill on albuterol inhaler was approximately 1 year ago.  Patient does not have PCP at this time due to change in PCP.    Past Medical History:  Diagnosis Date   Childhood asthma    Environmental allergies    GERD (gastroesophageal reflux disease)    Headache    migraines   Seasonal allergic conjunctivitis     Patient Active Problem List   Diagnosis Date Noted   Enteritis 10/06/2019   Tobacco use disorder 06/24/2019   Intractable nausea and vomiting 05/02/2016   Esophageal reflux 05/02/2016   Visit for preventive health examination 04/30/2016   Seizure-like activity ([place]) 04/30/2016   Anxiety and depression 11/21/2015   Gastroesophageal reflux disease with esophagitis 07/15/2015   Inattention 07/15/2015   RHINITIS, ALLERGIC 11/14/2006   ASTHMA, UNSPECIFIED 11/14/2006   ECZEMA, ATOPIC DERMATITIS 11/14/2006    Past Surgical History:  Procedure Laterality Date   Left Knee Menuiscus     "several surguries on same knee"   NM ESOPHAGEAL REFLUX     Right Shoulder Surgery     TONSILLECTOMY     adenoids       Home Medications    Prior to Admission medications   Medication Sig Start Date End Date Taking? Authorizing  Provider  albuterol (VENTOLIN HFA) 108 (90 Base) MCG/ACT inhaler Inhale 1-2 puffs into the lungs every 6 (six) hours as needed for wheezing or shortness of breath. 07/27/21   Teodora Medici, FNP  azithromycin (ZITHROMAX) 250 MG tablet Take 2 tabs PO x 1 dose, then 1 tab PO QD x 4 days 12/29/20   Scot Jun, FNP  fluticasone [facility]) 50 MCG/ACT nasal spray Place 2 sprays into both nostrils daily. 12/29/20   Scot Jun, FNP  levocetirizine (XYZAL) 5 MG tablet Take 1 tablet (5 mg total) by mouth every evening. 12/29/20   Scot Jun, FNP    Family History Family History  Problem Relation Age of Onset   Hypertension Father    Hyperlipidemia Father        Living   Liver disease Father    Fibromyalgia Mother    Thyroid disease Mother        Living   Lupus Mother    Anxiety disorder Mother    Clotting disorder Mother    Crohn's disease Mother    Bone cancer Paternal Grandmother    Heart disease Paternal Grandfather    Diabetes Paternal Grandfather    Diabetes Maternal Grandmother    Hypertension Maternal Grandmother    Liver disease Maternal Grandmother    Anxiety disorder Maternal Aunt    Depression Maternal Aunt    Lupus Maternal Aunt    Diabetes Paternal Aunt  Lupus Paternal Aunt    Fibromyalgia Paternal Aunt    Drug abuse Paternal Uncle    ADD / ADHD Brother    Anxiety disorder Sister    Migraines Other        Various   Seizures Brother     Social History Social History   Tobacco Use   Smoking status: Every Day    Packs/day: 0.50    Types: Cigarettes    Last attempt to quit: 04/17/2016    Years since quitting: 5.2   Smokeless tobacco: Never   Tobacco comments:    1 pack every 2-3 days  Vaping Use   Vaping Use: Never used  Substance Use Topics   Alcohol use: Yes    Alcohol/week: 0.0 standard drinks    Comment: socially   Drug use: Yes    Types: Marijuana    Comment: daily     Allergies   Penicillins   Review of Systems Review of  Systems Per HPI  Physical Exam Triage Vital Signs ED Triage Vitals [07/27/21 0828]  Enc Vitals Group     BP 122/75     Pulse Rate 83     Resp 18     Temp 97.8 F (36.6 C)     Temp Source Oral     SpO2 98 %     Weight      Height      Head Circumference      Peak Flow      Pain Score 0     Pain Loc      Pain Edu?      Excl. in GC?    No data found.  Updated Vital Signs BP 122/75 (BP Location: Right Arm)   Pulse 83   Temp 97.8 F (36.6 C) (Oral)   Resp 18   SpO2 98%   Visual Acuity Right Eye Distance:   Left Eye Distance:   Bilateral Distance:    Right Eye Near:   Left Eye Near:    Bilateral Near:     Physical Exam Constitutional:      General: He is not in acute distress.    Appearance: Normal appearance. He is not toxic-appearing or diaphoretic.  HENT:     Head: Normocephalic and atraumatic.     Right Ear: Tympanic membrane and ear canal normal.     Left Ear: Tympanic membrane and ear canal normal.     Nose: Congestion present.     Mouth/Throat:     Mouth: Mucous membranes are moist.     Pharynx: No posterior oropharyngeal erythema.  Eyes:     Extraocular Movements: Extraocular movements intact.     Conjunctiva/sclera: Conjunctivae normal.     Pupils: Pupils are equal, round, and reactive to light.  Cardiovascular:     Rate and Rhythm: Normal rate and regular rhythm.     Pulses: Normal pulses.     Heart sounds: Normal heart sounds.  Pulmonary:     Effort: Pulmonary effort is normal. No respiratory distress.     Breath sounds: Normal breath sounds. No wheezing.  Abdominal:     General: Abdomen is flat. Bowel sounds are normal.     Palpations: Abdomen is soft.  Musculoskeletal:        General: Normal range of motion.     Cervical back: Normal range of motion.  Skin:    General: Skin is warm and dry.  Neurological:     General: No focal deficit present.  Mental Status: He is alert and oriented to person, place, and time. Mental status is at  baseline.  Psychiatric:        Mood and Affect: Mood normal.        Behavior: Behavior normal.     UC Treatments / Results  Labs (all labs ordered are listed, but only abnormal results are displayed) Labs Reviewed  COVID-19, FLU A+B NAA    EKG   Radiology No results found.  Procedures Procedures (including critical care time)  Medications Ordered in UC Medications - No data to display  Initial Impression / Assessment and Plan / UC Course  I have reviewed the triage vital signs and the nursing notes.  Pertinent labs & imaging results that were available during my care of the patient were reviewed by me and considered in my medical decision making (see chart for details).     Patient presents with symptoms likely from a viral upper respiratory infection. Differential includes bacterial pneumonia, sinusitis, allergic rhinitis, covid 19, flu. Do not suspect underlying cardiopulmonary process. Symptoms seem unlikely related to ACS, CHF or COPD exacerbations, pneumonia, pneumothorax. Patient is nontoxic appearing and not in need of emergent medical intervention.  Highly suspicious for COVID-19 given patient's close exposure.  COVID-19 and flu test pending.  Recommended symptom control with over the counter medications: Daily oral anti-histamine, Oral decongestant or IN corticosteroid, saline irrigations, cepacol lozenges, Robitussin, Delsym, honey tea.  Will refill albuterol inhaler per patient request.  No signs of asthma exacerbation at this time.  Return if symptoms fail to improve in 1-2 weeks or you develop shortness of breath, chest pain, severe headache. Patient states understanding and is agreeable.  Discharged with PCP followup.  Final Clinical Impressions(s) / UC Diagnoses   Final diagnoses:  Viral upper respiratory tract infection with cough  Encounter for laboratory testing for COVID-19 virus  Close exposure to COVID-19 virus  Flu-like symptoms     Discharge  Instructions      You likely having a viral upper respiratory infection. We recommended symptom control. I expect your symptoms to start improving in the next 1-2 weeks.   1. Take a daily allergy pill/anti-histamine like Zyrtec, Claritin, or Store brand consistently for 2 weeks  2. For congestion you may try an oral decongestant like Mucinex or sudafed. You may also try intranasal flonase nasal spray or saline irrigations (neti pot, sinus cleanse)  3. For your sore throat you may try cepacol lozenges, salt water gargles, throat spray. Treatment of congestion may also help your sore throat.  4. For cough you may try Robitussen, Mucinex DM  5. Take Tylenol or Ibuprofen to help with pain/inflammation  6. Stay hydrated, drink plenty of fluids to keep throat coated and less irritated  Honey Tea For cough/sore throat try using a honey-based tea. Use 3 teaspoons of honey with juice squeezed from half lemon. Place shaved pieces of ginger into 1/2-1 cup of water and warm over stove top. Then mix the ingredients and repeat every 4 hours as needed.   Your COVID and flu test are pending.  We will call if they are positive.  Your albuterol inhaler has been refilled.     ED Prescriptions     Medication Sig Dispense Auth. Provider   albuterol (VENTOLIN HFA) 108 (90 Base) MCG/ACT inhaler Inhale 1-2 puffs into the lungs every 6 (six) hours as needed for wheezing or shortness of breath. 8 g Teodora Medici, [place]      PDMP not  reviewed this encounter.   Gustavus Bryant, [place] 07/27/21 [phone]

## 2021-07-27 NOTE — ED Triage Notes (Signed)
Pt c/o nasal congestion with drainage, body chills.   Denies headache, nausea, vomiting, diarrhea, constipation, fever, body aches.  Onset yesterday. States had covid exposure at work with positive employee.

## 2021-07-28 LAB — COVID-19, FLU A+B NAA
Influenza A, NAA: NOT DETECTED
Influenza B, NAA: NOT DETECTED
SARS-CoV-2, NAA: NOT DETECTED

## 2021-10-23 ENCOUNTER — Telehealth: Payer: Managed Care, Other (non HMO) | Admitting: Physician Assistant

## 2021-10-23 DIAGNOSIS — G44209 Tension-type headache, unspecified, not intractable: Secondary | ICD-10-CM | POA: Diagnosis not present

## 2021-10-23 MED ORDER — TIZANIDINE HCL 4 MG PO TABS: 4.0000 mg | ORAL_TABLET | Freq: Four times a day (QID) | ORAL | 0 refills | Status: DC | PRN

## 2021-10-23 NOTE — Patient Instructions (Signed)
°  [name], thank you for joining Leeanne Rio, PA-C for today's virtual visit.  While this provider is not your primary care provider (PCP), if your PCP is located in our provider database this encounter information will be shared with them immediately following your visit.  Consent: (Patient) [name] provided verbal consent for this virtual visit at the beginning of the encounter.  Current Medications:  Current Outpatient Medications:    albuterol (VENTOLIN HFA) 108 (90 Base) MCG/ACT inhaler, Inhale 1-2 puffs into the lungs every 6 (six) hours as needed for wheezing or shortness of breath., Disp: 8 g, Rfl: 0   azithromycin (ZITHROMAX) 250 MG tablet, Take 2 tabs PO x 1 dose, then 1 tab PO QD x 4 days, Disp: 6 tablet, Rfl: 0   fluticasone (FLONASE) 50 MCG/ACT nasal spray, Place 2 sprays into both nostrils daily., Disp: 16 g, Rfl: 0   levocetirizine (XYZAL) 5 MG tablet, Take 1 tablet (5 mg total) by mouth every evening., Disp: 90 tablet, Rfl: 0   Medications ordered in this encounter:  No orders of the defined types were placed in this encounter.    *If you need refills on other medications prior to your next appointment, please contact your pharmacy*  Follow-Up: Call back or seek an in-person evaluation if the symptoms worsen or if the condition fails to improve as anticipated.  Other Instructions Please keep hydrated and rest. Apply heating pad to the neck and shoulders for 10-15 minutes, a few times per day. Alternate Tylenol and Ibuprofen as needed. Take the Tizanidine as directed -- do not drive after taking this medication. If anything is not resolving or you note any new symptoms -- you need an in-person evaluation, either with PCP for milder symptoms or at ER for any severe headache or vision changes, vomiting, dizziness, etc.      If you have been instructed to have an in-person evaluation today at a local Urgent Care facility, please use the link  below. It will take you to a list of all of our available [facility], including address, phone number and hours of operation. Please do not delay care.  [facility]  If you or a family member do not have a primary care provider, use the link below to schedule a visit and establish care. When you choose a [facility] primary care physician or advanced practice provider, you gain a long-term partner in health. Find a Primary Care Provider  Learn more about [facility]'s in-office and virtual care options: [facility]

## 2021-10-23 NOTE — Progress Notes (Signed)
Virtual Visit Consent   Andrew Castillo, you are scheduled for a virtual visit with a [facility] provider today.     Just as with appointments in the office, your consent must be obtained to participate.  Your consent will be active for this visit and any virtual visit you may have with one of our providers in the next 365 days.     If you have a MyChart account, a copy of this consent can be sent to you electronically.  All virtual visits are billed to your insurance company just like a traditional visit in the office.    As this is a virtual visit, video technology does not allow for your provider to perform a traditional examination.  This may limit your provider's ability to fully assess your condition.  If your provider identifies any concerns that need to be evaluated in person or the need to arrange testing (such as labs, EKG, etc.), we will make arrangements to do so.     Although advances in technology are sophisticated, we cannot ensure that it will always work on either your end or our end.  If the connection with a video visit is poor, the visit may have to be switched to a telephone visit.  With either a video or telephone visit, we are not always able to ensure that we have a secure connection.     I need to obtain your verbal consent now.   Are you willing to proceed with your visit today?    Andrew Castillo has provided verbal consent on 10/23/2021 for a virtual visit (video or telephone).   [name], [place]   Date: 10/23/2021 2:40 PM   Virtual Visit via Video Note   I, [name], connected with  Andrew Castillo  ([id number], Nov 11, 1995) on 10/23/21 at  2:30 PM EST by a video-enabled telemedicine application and verified that I am speaking with the correct person using two identifiers.  Location: Patient: Virtual Visit Location Patient: Home Provider: Virtual Visit Location Provider: Home Office   I discussed the limitations of evaluation and management  by telemedicine and the availability of in person appointments. The patient expressed understanding and agreed to proceed.    History of Present Illness: Andrew Castillo is a 26 y.o. who identifies as a male who was assigned male at birth, and is being seen today for posterior headache starting this morning associated with some shoulder and neck tension, chest wall tension and sore ness and some mild photophobia. Notes felt fine last night. Did wake up with some mild sweating along with fever but denies any fever, chills. Denies URI symptoms or other symptoms of infection. Denies nausea or vomiting.   HPI: HPI  Problems:  Patient Active Problem List   Diagnosis Date Noted   Enteritis 10/06/2019   Tobacco use disorder 06/24/2019   Intractable nausea and vomiting 05/02/2016   Esophageal reflux 05/02/2016   Visit for preventive health examination 04/30/2016   Seizure-like activity ([facility]) 04/30/2016   Anxiety and depression 11/21/2015   Gastroesophageal reflux disease with esophagitis 07/15/2015   Inattention 07/15/2015   RHINITIS, ALLERGIC 11/14/2006   ASTHMA, UNSPECIFIED 11/14/2006   ECZEMA, ATOPIC DERMATITIS 11/14/2006    Allergies:  Allergies  Allergen Reactions   Penicillins Hives    Did it involve swelling of the face/tongue/throat, SOB, or low BP? Unknown Did it involve sudden or severe rash/hives, skin peeling, or any reaction on the inside of your mouth or nose?  Unknown Did you need to seek medical attention at a hospital or doctor's office? Unknown When did it last happen?   Pt doesn't remember he said he was a child    If all above answers are NO, may proceed with cephalosporin use.    Medications:  Current Outpatient Medications:    tiZANidine (ZANAFLEX) 4 MG tablet, Take 1 tablet (4 mg total) by mouth every 6 (six) hours as needed for muscle spasms., Disp: 15 tablet, Rfl: 0  Observations/Objective: Patient is well-developed, well-nourished in no acute distress.   Resting comfortably at home.  Head is normocephalic, atraumatic.  No labored breathing. Speech is clear and coherent with logical content.  Patient is alert and oriented at baseline.  Is moving around with issue, turning head without pain.   Assessment and Plan: 1. Acute non intractable tension-type headache  Absence of alarm signs or symptoms. No fever, chills or other symptoms to denote infection. Supportive measures, OTC medications reviewed. Rx tizanidine. Strict PCP follow-up and ER precautions reviewed. Work note written.   Follow Up Instructions: I discussed the assessment and treatment plan with the patient. The patient was provided an opportunity to ask questions and all were answered. The patient agreed with the plan and demonstrated an understanding of the instructions.  A copy of instructions were sent to the patient via MyChart unless otherwise noted below.   The patient was advised to call back or seek an in-person evaluation if the symptoms worsen or if the condition fails to improve as anticipated.  Time:  I spent 12 minutes with the patient via telehealth technology discussing the above problems/concerns.    [name], PA-C

## 2022-02-06 ENCOUNTER — Encounter: Payer: Self-pay | Admitting: Neurology

## 2022-02-06 ENCOUNTER — Ambulatory Visit: Payer: Managed Care, Other (non HMO) | Admitting: Neurology

## 2022-02-21 ENCOUNTER — Inpatient Hospital Stay: Payer: Managed Care, Other (non HMO) | Admitting: Neurology

## 2022-02-21 ENCOUNTER — Encounter: Payer: Self-pay | Admitting: Neurology

## 2022-03-21 ENCOUNTER — Encounter (HOSPITAL_BASED_OUTPATIENT_CLINIC_OR_DEPARTMENT_OTHER): Payer: Self-pay

## 2022-03-21 ENCOUNTER — Emergency Department (HOSPITAL_BASED_OUTPATIENT_CLINIC_OR_DEPARTMENT_OTHER)
Admission: EM | Admit: 2022-03-21 | Discharge: 2022-03-21 | Disposition: A | Discharge status: Home / Self Care | Payer: Managed Care, Other (non HMO) | Attending: Emergency Medicine | Admitting: Emergency Medicine

## 2022-03-21 ENCOUNTER — Other Ambulatory Visit: Payer: Self-pay

## 2022-03-21 DIAGNOSIS — R630 Anorexia: Secondary | ICD-10-CM | POA: Insufficient documentation

## 2022-03-21 DIAGNOSIS — R112 Nausea with vomiting, unspecified: Secondary | ICD-10-CM | POA: Insufficient documentation

## 2022-03-21 DIAGNOSIS — D72829 Elevated white blood cell count, unspecified: Secondary | ICD-10-CM | POA: Diagnosis not present

## 2022-03-21 DIAGNOSIS — R519 Headache, unspecified: Secondary | ICD-10-CM | POA: Diagnosis not present

## 2022-03-21 DIAGNOSIS — R1084 Generalized abdominal pain: Secondary | ICD-10-CM | POA: Diagnosis not present

## 2022-03-21 DIAGNOSIS — R61 Generalized hyperhidrosis: Secondary | ICD-10-CM | POA: Diagnosis not present

## 2022-03-21 DIAGNOSIS — R197 Diarrhea, unspecified: Secondary | ICD-10-CM | POA: Diagnosis not present

## 2022-03-21 DIAGNOSIS — R509 Fever, unspecified: Secondary | ICD-10-CM | POA: Diagnosis not present

## 2022-03-21 DIAGNOSIS — R42 Dizziness and giddiness: Secondary | ICD-10-CM | POA: Insufficient documentation

## 2022-03-21 LAB — URINALYSIS, ROUTINE W REFLEX MICROSCOPIC
Bilirubin Urine: NEGATIVE
Glucose, UA: NEGATIVE mg/dL
Hgb urine dipstick: NEGATIVE
Ketones, ur: 15 mg/dL — AB
Nitrite: NEGATIVE
Protein, ur: 30 mg/dL — AB
Specific Gravity, Urine: 1.038 — ABNORMAL HIGH (ref 1.005–1.030)
pH: 6 (ref 5.0–8.0)

## 2022-03-21 LAB — LIPASE, BLOOD: Lipase: 17 U/L (ref 11–51)

## 2022-03-21 LAB — CBC
HCT: 48.9 % (ref 39.0–52.0)
Hemoglobin: 16.5 g/dL (ref 13.0–17.0)
MCH: 28.8 pg (ref 26.0–34.0)
MCHC: 33.7 g/dL (ref 30.0–36.0)
MCV: 85.3 fL (ref 80.0–100.0)
Platelets: 304 10*3/uL (ref 150–400)
RBC: 5.73 MIL/uL (ref 4.22–5.81)
RDW: 12.8 % (ref 11.5–15.5)
WBC: 14.7 10*3/uL — ABNORMAL HIGH (ref 4.0–10.5)
nRBC: 0 % (ref 0.0–0.2)

## 2022-03-21 LAB — COMPREHENSIVE METABOLIC PANEL
ALT: 20 U/L (ref 0–44)
AST: 20 U/L (ref 15–41)
Albumin: 5.4 g/dL — ABNORMAL HIGH (ref 3.5–5.0)
Alkaline Phosphatase: 73 U/L (ref 38–126)
Anion gap: 17 — ABNORMAL HIGH (ref 5–15)
BUN: 15 mg/dL (ref 6–20)
CO2: 25 mmol/L (ref 22–32)
Calcium: 10.9 mg/dL — ABNORMAL HIGH (ref 8.9–10.3)
Chloride: 99 mmol/L (ref 98–111)
Creatinine, Ser: 1.14 mg/dL (ref 0.61–1.24)
GFR, Estimated: >60 mL/min (ref >60)
Glucose, Bld: 121 mg/dL — ABNORMAL HIGH (ref 70–99)
Potassium: 4 mmol/L (ref 3.5–5.1)
Sodium: 141 mmol/L (ref 135–145)
Total Bilirubin: 1.7 mg/dL — ABNORMAL HIGH (ref 0.3–1.2)
Total Protein: 8.7 g/dL — ABNORMAL HIGH (ref 6.5–8.1)

## 2022-03-21 MED ORDER — ONDANSETRON 4 MG PO TBDP
4.0000 mg | ORAL_TABLET | Freq: Once | ORAL | Status: DC | PRN
  Filled 2022-03-21: qty 1

## 2022-03-21 MED ORDER — ONDANSETRON HCL 4 MG/2ML IJ SOLN
4.0000 mg | Freq: Once | INTRAMUSCULAR | Status: AC
  Administered 2022-03-21: 4 mg via INTRAVENOUS

## 2022-03-21 MED ORDER — DROPERIDOL 2.5 MG/ML IJ SOLN
1.2500 mg | Freq: Once | INTRAMUSCULAR | Status: AC
  Administered 2022-03-21: 1.25 mg via INTRAVENOUS
  Filled 2022-03-21: qty 2

## 2022-03-21 MED ORDER — ONDANSETRON 4 MG PO TBDP: 4.0000 mg | ORAL_TABLET | Freq: Three times a day (TID) | ORAL | 0 refills | Status: DC | PRN

## 2022-03-21 MED ORDER — DICYCLOMINE HCL 20 MG PO TABS: 20.0000 mg | ORAL_TABLET | Freq: Three times a day (TID) | ORAL | 0 refills | Status: DC | PRN

## 2022-03-21 MED ORDER — DICYCLOMINE HCL 10 MG/ML IM SOLN
20.0000 mg | Freq: Once | INTRAMUSCULAR | Status: AC
  Administered 2022-03-21: 20 mg via INTRAMUSCULAR
  Filled 2022-03-21: qty 2

## 2022-03-21 MED ORDER — SODIUM CHLORIDE 0.9 % IV BOLUS
1000.0000 mL | Freq: Once | INTRAVENOUS | Status: AC
  Administered 2022-03-21: 1000 mL via INTRAVENOUS

## 2022-03-21 MED ORDER — PANTOPRAZOLE SODIUM 40 MG PO TBEC: 40.0000 mg | DELAYED_RELEASE_TABLET | Freq: Every day | ORAL | 0 refills | Status: DC

## 2022-03-21 NOTE — ED Provider Notes (Signed)
[facility] EMERGENCY DEPT Provider Note   CSN: [id number] Arrival date & time: 03/21/22  1512     History  Chief Complaint  Patient presents with   Emesis    Andrew Castillo is a 26 y.o. male with history of GERD, migraines, seizure presents with 1 month of nausea and vomiting.  The patient is also complaining of diffuse fever, chills, diaphoresis, abdominal pain, diarrhea, decreased appetite, lightheadedness, and headache over this period. He states that his symptoms have been daily, worsened this week, and that he has developed soreness in his chest from vomiting.  The patient states that he has experienced recurrent nausea and vomiting in the past and was prescribed an antacid by his GI physician. He states that he has not taken this medication in over a year but that he has an appointment scheduled with his GI physician later this month. The patient endorses daily use of marijuana with his last use of marijuana occurring yesterday.  The patient endorses regular use of alcohol and states that he had 7-8 shots of hard alcohol last night.  The patient denies use of cocaine or other recreational drugs.  The patient also states that he experienced a seizure 1 month ago.  He states that he had not experienced a seizure prior to then with the exception of his first seizure in 2019.  The patient states that he has not followed up with a neurologist and has not currently taking any medications for seizures.  The patient denies hematochezia, constipation, shortness of breath.   The history is provided by the patient.  Emesis Associated symptoms: abdominal pain, chills, diarrhea, fever and headaches        Home Medications Prior to Admission medications   Medication Sig Start Date End Date Taking? Authorizing Provider  tiZANidine (ZANAFLEX) 4 MG tablet Take 1 tablet (4 mg total) by mouth every 6 (six) hours as needed for muscle spasms. 10/23/21   Waldon Merl, PA-C       Allergies    Penicillins    Review of Systems   Review of Systems  Constitutional:  Positive for appetite change, chills, diaphoresis and fever.  Respiratory:  Negative for shortness of breath.   Cardiovascular:  Positive for chest pain.  Gastrointestinal:  Positive for abdominal pain, diarrhea, nausea and vomiting. Negative for blood in stool and constipation.  Genitourinary:  Negative for dysuria and frequency.  Neurological:  Positive for light-headedness and headaches.    Physical Exam Updated Vital Signs BP (!) 150/94 (BP Location: Right Arm)   Pulse 63   Temp 98 F (36.7 C) (Oral)   Resp 18   Ht 6' (1.829 m)   Wt 83.9 kg   SpO2 99%   BMI 25.09 kg/m  Physical Exam Constitutional:      Appearance: He is not toxic-appearing.  HENT:     Mouth/Throat:     Mouth: Mucous membranes are moist.  Cardiovascular:     Rate and Rhythm: Normal rate and regular rhythm.     Pulses: Normal pulses.     Heart sounds: No murmur heard.    No gallop.  Pulmonary:     Effort: Pulmonary effort is normal.     Breath sounds: Normal breath sounds. No wheezing, rhonchi or rales.  Abdominal:     General: Bowel sounds are normal. There is no distension.     Palpations: Abdomen is soft. There is no mass.     Comments: Diffuse abdominal tenderness in all four  quadrants on palpation.   Musculoskeletal:     Comments: Tenderness to palpation of the chest near the mid-sternum and left chest. Tenderness to palpation of the lumbar paraspinal region bilaterally. No midline spinal tenderness.   Skin:    General: Skin is warm and dry.  Neurological:     Mental Status: He is alert.     ED Results / Procedures / Treatments   Labs (all labs ordered are listed, but only abnormal results are displayed) Labs Reviewed  COMPREHENSIVE METABOLIC PANEL - Abnormal; Notable for the following components:      Result Value   Glucose, Bld 121 (*)    Calcium 10.9 (*)    Total Protein 8.7 (*)    Albumin  5.4 (*)    Total Bilirubin 1.7 (*)    Anion gap 17 (*)    All other components within normal limits  CBC - Abnormal; Notable for the following components:   WBC 14.7 (*)    All other components within normal limits  URINALYSIS, ROUTINE W REFLEX MICROSCOPIC - Abnormal; Notable for the following components:   Specific Gravity, Urine 1.038 (*)    Ketones, ur 15 (*)    Protein, ur 30 (*)    Leukocytes,Ua MODERATE (*)    All other components within normal limits  LIPASE, BLOOD    EKG None  Radiology No results found.  Procedures Procedures    Medications Ordered in ED Medications  ondansetron (ZOFRAN) injection 4 mg (4 mg Intravenous Given 03/21/22 1532)    ED Course/ Medical Decision Making/ A&P                           Medical Decision Making Andrew Castillo is a 26 y.o. male with history of GERD, migraines, seizure that presents with 1 month of nausea and vomiting.  The patient is not toxic-appearing. Differential diagnosis includes but is not limited to cannabinoid hyperemesis syndrome, viral gastroenteritis, pancreatitis, GERD.  Given the patient's history of recurrent nausea and vomiting and use of cannabis, cannabinoid hyperemesis syndrome is likely.  Will order CBC and CMP to assess the patient's cell counts, electrolytes, renal function, and liver function.  We will order lipase to rule out pancreatitis. Given that the patient's chest pain is reproducible with palpation it is likely musculoskeletal in nature. Will order EKG to rule out potential causes of chest pain.  Will order IV fluids, Zofran, and droperidol for patient's nausea and vomiting. Patient discussed with Dr. Jacqulyn Bath.  Amount and/or Complexity of Data Reviewed Labs: ordered. Decision-making details documented in ED Course.  Risk Prescription drug management.   6:33 PM  The patient's lipase is normal making the diagnosis of pancreatitis less likely. EKG is normal making it is less likely that the patients  chest pain is cardiac in nature.  The patient's WBC count is elevated at 14.7, however, given the lack of fever and length of symptoms I am less concerned for viral gastroenteritis.     6:47 PM  I spoke with the patient who states his nausea and vomiting has improved after receiving Zofran, Droperidol, and IVF. He is still complaining of mild diffuse abdominal. I updated the patient that his EKG was normal and that his labs were relatively normal with the exception of an elevated WBC count of 14.7. I updated the patient on his UA that demonstrates moderate leukocytes. The patient denies experiencing dysuria or frequency and therefore I am less concerned for UTI.  The patient states that he has had mild bilateral lower back pain for the last 2 months after a fall on the stairs. I re-examined the patient and he demonstrated tenderness to palpation of the lumbar paraspinal region bilaterally without midline spinal tenderness. Given the chronic nature of his back pain I am less concerned for pyelonephritis.  Given that the patient's lipase is normal I am less concerned for pancreatitis. Given the patient's chronic use of marijuana and history of GERD, I believe the patient's symptoms are likely due to cannabinoid hyperemesis syndrome or GERD.  Discussed with the patient plan to be discharged with Bentyl, Zofran, Protonix.  Discussed with patient plan to follow up on urine culture and follow-up in clinic.  Discussed with patient plan to return to the ED if he develops new or worsening symptoms.  Patient agrees with plan.        Final Clinical Impression(s) / ED Diagnoses Final diagnoses:  None    Rx / DC Orders ED Discharge Orders     None         Whitnee Orzel, Gaylyn Cheers, MD 03/21/22 2327    Maia Plan, MD 03/27/22 [id number]

## 2022-03-21 NOTE — ED Triage Notes (Signed)
Patient here POV from Home.  Endorses Intermittent Nausea and Emesis for approximately 1 Month. States it has worsened since this AM and has been consistent.   Associated with Generalized Pain (More localized to Epigastric Area). No Fevers. Moderate Diarrhea as well.    NAD Noted during Triage. A&Ox4. GCS 15. Ambulatory.

## 2022-03-21 NOTE — ED Notes (Signed)
Pt awake and alert lying in bed with visitor at bedside- pt reporting nausea has greatly improved however RUQ pain ongoing-- pain now 4/10 described as cramping.  Pt asking for pain med- will notify provider.  1L NS bolus now completed.   Will monitor for acute changes and maintain plan of care.

## 2022-03-21 NOTE — ED Notes (Signed)
Pt states nausea relief after medication admin, NS continued infusing at this time, provider at bedside

## 2022-03-21 NOTE — Discharge Instructions (Signed)

## 2022-03-22 LAB — URINE CULTURE: Culture: NO GROWTH

## 2022-03-28 ENCOUNTER — Other Ambulatory Visit: Payer: Self-pay

## 2022-03-28 ENCOUNTER — Emergency Department (HOSPITAL_BASED_OUTPATIENT_CLINIC_OR_DEPARTMENT_OTHER)
Admission: EM | Admit: 2022-03-28 | Discharge: 2022-03-28 | Disposition: A | Discharge status: Home / Self Care | Payer: Managed Care, Other (non HMO) | Attending: Emergency Medicine | Admitting: Emergency Medicine

## 2022-03-28 ENCOUNTER — Encounter (HOSPITAL_BASED_OUTPATIENT_CLINIC_OR_DEPARTMENT_OTHER): Payer: Self-pay

## 2022-03-28 ENCOUNTER — Telehealth: Payer: Self-pay | Admitting: Nurse Practitioner

## 2022-03-28 DIAGNOSIS — R1013 Epigastric pain: Secondary | ICD-10-CM | POA: Diagnosis not present

## 2022-03-28 DIAGNOSIS — D72829 Elevated white blood cell count, unspecified: Secondary | ICD-10-CM | POA: Insufficient documentation

## 2022-03-28 DIAGNOSIS — R1115 Cyclical vomiting syndrome unrelated to migraine: Secondary | ICD-10-CM

## 2022-03-28 DIAGNOSIS — G43A0 Cyclical vomiting, not intractable: Secondary | ICD-10-CM | POA: Diagnosis not present

## 2022-03-28 DIAGNOSIS — R112 Nausea with vomiting, unspecified: Secondary | ICD-10-CM | POA: Diagnosis present

## 2022-03-28 LAB — CBC
HCT: 46.7 % (ref 39.0–52.0)
Hemoglobin: 16.1 g/dL (ref 13.0–17.0)
MCH: 29 pg (ref 26.0–34.0)
MCHC: 34.5 g/dL (ref 30.0–36.0)
MCV: 84.1 fL (ref 80.0–100.0)
Platelets: 285 10*3/uL (ref 150–400)
RBC: 5.55 MIL/uL (ref 4.22–5.81)
RDW: 12.2 % (ref 11.5–15.5)
WBC: 15.4 10*3/uL — ABNORMAL HIGH (ref 4.0–10.5)
nRBC: 0 % (ref 0.0–0.2)

## 2022-03-28 LAB — COMPREHENSIVE METABOLIC PANEL
ALT: 19 U/L (ref 0–44)
AST: 14 U/L — ABNORMAL LOW (ref 15–41)
Albumin: 5.2 g/dL — ABNORMAL HIGH (ref 3.5–5.0)
Alkaline Phosphatase: 53 U/L (ref 38–126)
Anion gap: 18 — ABNORMAL HIGH (ref 5–15)
BUN: 17 mg/dL (ref 6–20)
CO2: 22 mmol/L (ref 22–32)
Calcium: 10.5 mg/dL — ABNORMAL HIGH (ref 8.9–10.3)
Chloride: 101 mmol/L (ref 98–111)
Creatinine, Ser: 1.02 mg/dL (ref 0.61–1.24)
GFR, Estimated: >60 mL/min (ref >60)
Glucose, Bld: 124 mg/dL — ABNORMAL HIGH (ref 70–99)
Potassium: 3.5 mmol/L (ref 3.5–5.1)
Sodium: 141 mmol/L (ref 135–145)
Total Bilirubin: 1.1 mg/dL (ref 0.3–1.2)
Total Protein: 7.7 g/dL (ref 6.5–8.1)

## 2022-03-28 LAB — LIPASE, BLOOD: Lipase: 35 U/L (ref 11–51)

## 2022-03-28 MED ORDER — DICYCLOMINE HCL 10 MG PO CAPS
20.0000 mg | ORAL_CAPSULE | Freq: Once | ORAL | Status: AC
  Administered 2022-03-28: 20 mg via ORAL
  Filled 2022-03-28: qty 2

## 2022-03-28 MED ORDER — PANTOPRAZOLE 80MG IVPB - SIMPLE MED
80.0000 mg | Freq: Once | INTRAVENOUS | Status: AC
  Administered 2022-03-28: 80 mg via INTRAVENOUS
  Filled 2022-03-28: qty 100

## 2022-03-28 MED ORDER — ONDANSETRON 4 MG PO TBDP
4.0000 mg | ORAL_TABLET | Freq: Three times a day (TID) | ORAL | 0 refills | Status: DC | PRN
Start: 2022-03-28 — End: 2022-07-25

## 2022-03-28 MED ORDER — PROMETHAZINE HCL 25 MG RE SUPP: 25.0000 mg | Freq: Four times a day (QID) | RECTAL | 0 refills | Status: DC | PRN

## 2022-03-28 MED ORDER — HALOPERIDOL LACTATE 5 MG/ML IJ SOLN
5.0000 mg | Freq: Once | INTRAMUSCULAR | Status: AC
  Administered 2022-03-28: 5 mg via INTRAVENOUS
  Filled 2022-03-28: qty 1

## 2022-03-28 MED ORDER — SODIUM CHLORIDE 0.9 % IV BOLUS
1000.0000 mL | Freq: Once | INTRAVENOUS | Status: AC
  Administered 2022-03-28: 1000 mL via INTRAVENOUS

## 2022-03-28 MED ORDER — ONDANSETRON HCL 4 MG/2ML IJ SOLN
4.0000 mg | Freq: Once | INTRAMUSCULAR | Status: AC | PRN
  Administered 2022-03-28: 4 mg via INTRAVENOUS
  Filled 2022-03-28: qty 2

## 2022-03-28 MED ORDER — METOCLOPRAMIDE HCL 5 MG/ML IJ SOLN
10.0000 mg | Freq: Once | INTRAMUSCULAR | Status: AC
  Administered 2022-03-28: 10 mg via INTRAVENOUS
  Filled 2022-03-28: qty 2

## 2022-03-28 MED ORDER — PANTOPRAZOLE SODIUM 40 MG IV SOLR
INTRAVENOUS | Status: AC
  Administered 2022-03-28: 40 mg
  Filled 2022-03-28: qty 20

## 2022-03-28 NOTE — Telephone Encounter (Signed)
Spoke with pt's mother to let her know about prescriptions that were sent in. Pt's mother wanted to let Dr. Leone Payor know that pt is currently at ED and started throwing up blood.

## 2022-03-28 NOTE — Telephone Encounter (Signed)
Spoke with pt's mother who stated that pt was recently seen at [facility] ER for continuous vomiting and stated that vomiting has come back again and they are currently out of medications. Pt has been throwing up about every three minutes and is unable to keep anything down. Advised mother that if pt is unable to keep anything down he would need to go back to the ER. Pt's mother verbalized understanding. Pt's mother also requested that we send in refills on the phenergan suppositories and zofran ODT for pt to have after he gets out of the ER. Pt last seen by Willette Cluster, NP on 10/19/19. Pt's mother stated that these medications improve pt's symptoms.

## 2022-03-28 NOTE — ED Provider Notes (Signed)
[facility] EMERGENCY DEPT Provider Note   CSN: [id number] Arrival date & time: 03/28/22  1452     History  Chief Complaint  Patient presents with   Emesis    Andrew Castillo is a 26 y.o. male.  HPI     26 year old male comes in with chief complaint of vomiting  Patient states that he has been having nausea and vomiting for the last several days.  He had come to the ER few days back, improved while in the emergency room, but the very next day started having nausea, vomiting and abdominal pain again.  Pain is epigastric and described as burning pain.  He has had more than 10 episodes of nausea and vomiting today.  Emesis is not bilious.  He has no history of abdominal surgery.  Indicates that he gets bouts of these cyclic vomiting syndrome off and on, without any specific trigger.  He has seen GI doctors in the past.  He does admit to marijuana use regularly and regular alcohol use.  Denies any other illicit drug use.  Review of systems negative for blood in the emesis, blood in the stool.  Home Medications Prior to Admission medications   Medication Sig Start Date End Date Taking? Authorizing Provider  dicyclomine (BENTYL) 20 MG tablet Take 1 tablet (20 mg total) by mouth 3 (three) times daily as needed for spasms. 03/21/22   Long, Arlyss Repress, MD  ondansetron (ZOFRAN-ODT) 4 MG disintegrating tablet Take 1 tablet (4 mg total) by mouth every 8 (eight) hours as needed. 03/28/22   Iva Boop, MD  pantoprazole (PROTONIX) 40 MG tablet Take 1 tablet (40 mg total) by mouth daily. 03/21/22 04/20/22  Long, Arlyss Repress, MD  promethazine (PHENERGAN) 25 MG suppository Place 1 suppository (25 mg total) rectally every 6 (six) hours as needed for nausea or vomiting. 03/28/22   Iva Boop, MD  tiZANidine (ZANAFLEX) 4 MG tablet Take 1 tablet (4 mg total) by mouth every 6 (six) hours as needed for muscle spasms. 10/23/21   Waldon Merl, PA-C      Allergies    Penicillins    Review  of Systems   Review of Systems  All other systems reviewed and are negative.   Physical Exam Updated Vital Signs BP 129/60   Pulse 70   Temp 97.7 F (36.5 C) (Temporal)   Resp 20   Ht 6' (1.829 m)   Wt 83.9 kg   SpO2 98%   BMI 25.09 kg/m  Physical Exam Vitals and nursing note reviewed.  Constitutional:      Appearance: He is well-developed.  HENT:     Head: Atraumatic.  Cardiovascular:     Rate and Rhythm: Normal rate.  Pulmonary:     Effort: Pulmonary effort is normal.  Abdominal:     Palpations: Abdomen is soft.     Tenderness: There is abdominal tenderness.     Comments: Abdomen is soft, patient has epigastric tenderness  Musculoskeletal:     Cervical back: Neck supple.  Skin:    General: Skin is warm.  Neurological:     Mental Status: He is alert and oriented to person, place, and time.     ED Results / Procedures / Treatments   Labs (all labs ordered are listed, but only abnormal results are displayed) Labs Reviewed  COMPREHENSIVE METABOLIC PANEL - Abnormal; Notable for the following components:      Result Value   Glucose, Bld 124 (*)  Calcium 10.5 (*)    Albumin 5.2 (*)    AST 14 (*)    Anion gap 18 (*)    All other components within normal limits  CBC - Abnormal; Notable for the following components:   WBC 15.4 (*)    All other components within normal limits  LIPASE, BLOOD  URINALYSIS, ROUTINE W REFLEX MICROSCOPIC    EKG EKG Interpretation  Date/Time:  Wednesday March 28 2022 15:23:24 EDT Ventricular Rate:  79 PR Interval:  128 QRS Duration: 82 QT Interval:  402 QTC Calculation: 460 R Axis:   69 Text Interpretation: Normal sinus rhythm Normal ECG When compared with ECG of 21-Mar-2022 15:36, No significant change was found Confirmed by Derwood Kaplan [phone]) on 03/28/2022 6:20:38 PM  Radiology No results found.  Procedures Procedures    Medications Ordered in ED Medications  metoCLOPramide (REGLAN) injection 10 mg (has no  administration in time range)  dicyclomine (BENTYL) tablet 20 mg (has no administration in time range)  ondansetron (ZOFRAN) injection 4 mg (4 mg Intravenous Given 03/28/22 1519)  haloperidol lactate (HALDOL) injection 5 mg (5 mg Intravenous Given 03/28/22 1819)  sodium chloride 0.9 % bolus 1,000 mL (1,000 mLs Intravenous New Bag/Given 03/28/22 1818)  pantoprazole (PROTONIX) 80 mg /NS 100 mL IVPB (80 mg Intravenous New Bag/Given 03/28/22 1846)  pantoprazole (PROTONIX) 40 MG injection (40 mg  Given 03/28/22 1837)    ED Course/ Medical Decision Making/ A&P                           Medical Decision Making Amount and/or Complexity of Data Reviewed Labs: ordered.  Risk Prescription drug management.   This patient presents to the ED with chief complaint(s) of severe nausea and vomiting with pertinent past medical history of cyclic vomiting syndrome and regular cannabis use which further complicates the presenting complaint. The complaint involves an extensive differential diagnosis and also carries with it a high risk of complications and morbidity.    Abd exam is reassuring.   The differential diagnosis includes : Esophagitis, PUD, cyclic vomiting syndrome, cannabinoid hyperemesis.  The initial plan is to get basic blood work and focus on symptom management.   Additional history obtained: Additional history obtained from significant other Records reviewed previous admission documents and previous encounters with the GI doctor and emergency doctors for nausea and vomiting reviewed CT scan within the last 2 years, which was negative for acute findings.  Independent labs interpretation:  The following labs were independently interpreted: Normal metabolic profile, no evidence of organ dysfunction.  Elevated white count at 15.4, likely secondary to acute stress   Treatment and Reassessment: Patient reassessed at 8:15 PM.  The Haldol helped his symptoms.  He is no longer having nausea and  vomiting.  He is resting comfortably.  Oral challenge passed.  Stable for discharge now.     Final Clinical Impression(s) / ED Diagnoses Final diagnoses:  Cyclical vomiting    Rx / DC Orders ED Discharge Orders     None         Derwood Kaplan, MD 03/28/22 2024

## 2022-03-28 NOTE — ED Triage Notes (Signed)
Patient here POV from Home.  Endorses being seen in ED recently N/V. Treated and Discharged feeling Somewhat Better.  Returns today for Reevaluation as Symptoms have Returned and Patient endorses possible Hematemesis. States Phenergan alleviates Symptoms somewhat but Symptoms return.   Uncomfortable during Triage. A&Ox4. GCS 15. BIB Wheelchair.

## 2022-03-28 NOTE — Telephone Encounter (Signed)
I sent in the Rxs requested He may be back at [facility]

## 2022-03-28 NOTE — ED Notes (Signed)
Pt currently unable to provide urine sample, gave pt a urinal and instructed pt to press call button so we can obtain specimen.

## 2022-03-28 NOTE — Discharge Instructions (Signed)
Please follow-up with your primary care doctor or GI doctor for optimal management of your cyclic vomiting syndrome.  We have provided some information on cannabinoid hyperemesis syndrome as well, please read the instructions provided to see if there is any association between cannabinoid use and vomiting.

## 2022-04-05 ENCOUNTER — Encounter: Payer: Self-pay | Admitting: Nurse Practitioner

## 2022-04-05 ENCOUNTER — Ambulatory Visit (INDEPENDENT_AMBULATORY_CARE_PROVIDER_SITE_OTHER): Payer: Managed Care, Other (non HMO) | Admitting: Nurse Practitioner

## 2022-04-05 VITALS — BP 120/90 | HR 88 | Ht 71.0 in | Wt 191.1 lb

## 2022-04-05 DIAGNOSIS — R112 Nausea with vomiting, unspecified: Secondary | ICD-10-CM | POA: Diagnosis not present

## 2022-04-05 DIAGNOSIS — K5900 Constipation, unspecified: Secondary | ICD-10-CM | POA: Diagnosis not present

## 2022-04-05 DIAGNOSIS — R1084 Generalized abdominal pain: Secondary | ICD-10-CM | POA: Diagnosis not present

## 2022-04-05 MED ORDER — DICYCLOMINE HCL 10 MG PO CAPS: 10.0000 mg | ORAL_CAPSULE | Freq: Three times a day (TID) | ORAL | 3 refills | Status: DC

## 2022-04-05 MED ORDER — FAMOTIDINE 20 MG PO TABS: 20.0000 mg | ORAL_TABLET | Freq: Every morning | ORAL | 3 refills | Status: DC

## 2022-04-05 NOTE — Patient Instructions (Signed)
If you are age 25 or older, your body mass index should be between 23-30. Your Body mass index is 26.66 kg/m. If this is out of the aforementioned range listed, please consider follow up with your Primary Care Provider.  If you are age 80 or younger, your body mass index should be between 19-25. Your Body mass index is 26.66 kg/m. If this is out of the aformentioned range listed, please consider follow up with your Primary Care Provider.   You have been scheduled for a gastric emptying scan at [facility]  Radiology on 04/30/22 at 8am. Please arrive at least 15 minutes prior to your appointment for registration. Please make certain not to have anything to eat or drink after midnight the night before your test. Hold all stomach medications (ex: Zofran, phenergan, Reglan) 48 hours prior to your test. If you need to reschedule your appointment, please contact radiology scheduling at [phone].  A gastric-emptying study measures how long it takes for food to move through your stomach. There are several ways to measure stomach emptying. In the most common test, you eat food that contains a small amount of radioactive material. A scanner that detects the movement of the radioactive material is placed over your abdomen to monitor the rate at which food leaves your stomach. This test normally takes about 4 hours to complete.  Start Miralax 1 capful in 8 oz water twice daily Stop Pantoprazole Dicyclomine 10 mg before meals three times daily, # 40 and 3 refills Start Pepcid 20 mg every morning to protect esophagus from acid related to vomiting, # 30 and 3 refills Will schedule you for a gastric emptying study Please discontinue cannabis  The [place] [facility] providers would like to encourage you to use [facility] to communicate with providers for non-urgent requests or questions.  Due to long hold times on the telephone, sending your provider a message by [facility] may be a faster and more efficient way to get a  response.  Please allow 48 business hours for a response.  Please remember that this is for non-urgent requests.   It was a pleasure to see you today!  Thank you for trusting me with your gastrointestinal care!    Willette Cluster, NP

## 2022-04-05 NOTE — Progress Notes (Signed)
Chief Complaint:  nausea and vomiting   Assessment & Plan   # Chronic intermittent nausea / vomiting / mid abdominal pain / new constipation. Previous evaluation of nausea / vomiting and abdominal pain has included an unrevealing EGD in 2017, CT AP in Jan 2021 showing several subcentimeter lymph nodes in RLQ / inguinal area.  No bowel wall thickening. The chronic N/V could be functional, maybe THC related?  Cannot exclude gastroparesis especially since  he vomits undigested food hours after consumption. Mid abdominal pain may be due to constipation.  Obtain gastric emptying study.  He already has a refill on promethazine suppositories which he will use as needed.  May benefit from a TCA at some point  Advised him to stop using THC, especially while we are evaluating his symptoms Treat constipation, see below Follow up in 6-8 weeks. Will call him in the interim with test results   # Constipation, new. Probably related to decreased PO intake and medications ( Bentyl and Zofran).  Start miralax BID. Should be able to tolerate since able to tolerate the Pedialyte.  Mild elevation in serum calcium noted but it co- insides with mild elevation of albumin.   # History of reflux esophagitis ( esophageal biopsies in 2017) which was probably related to vomiting.  He doesn't really have reflux symptoms. He feels PPI actually worsens his symptoms.  Can stop PPI for now.  Given the ongoing vomiting I think he needs to be on something to protect his esophagus. Trial of Pepcid 20 mg Q am.   # Chronic leukocytosis including count of 15K earlier this month.  May need Hematology consult at some point .  # Intermittent mild elevation in T.Bili ( < 2). Probably Gilberts. Normal bilirubin on recent labs.  Can fraction bilirubin at follow up visit.   HPI:    Patient is a 26 yo male with a history of THC use, chronic nausea / vomiting.   He was evaluated here in 2017 Leone Payor) for nausea / vomiting.  Underwent EGD with findings of Grade B esophagitis.  Esophageal biopsies compatible with reflux esophagitis  We have not seen him since 2017 but he has continued to be evaluated in  in ED for abdominal pain, nausea and vomiting. On one of those occasions in 2021 at CTAP was done and unrevealing except for ? Mesenteric adenitis. At various time he has had mildly hyperbilirubinemia ( < 2). Additionally his WBC is often elevated as high at 16K   Interval history: He hasn't been able to eat a full meal for several weeks because of mid abdominal pain when he starts to eat. This then leads to nausea / vomiting. He stopped using THC on 7/5 after ED visit for the vomiting. The vomiting persisted, he went back to ED on 7/12. ED gave him phenergan supp, haldol, PPI. He has since gone back to using THC.   Jaqwan mentions that he tends to vomit undigested food.  Sometimes he can recognize tood in emesis that he consumed several hours prior. He doesn't take any meds other than that prescribed by ED as listed above. The phenergan suppositories help. He isn't taking the Haldol. He tries to take the pantoprazole but often vomits it up.   He has been constipated lately. He went 3 weeks without a BM.Last weekend he finally had a BM which was loose and oily ( I saw a cell phone photo). He had not taken any laxative / supplements or anything to  make the stool oily. He has never had an oily stool up until now.   He is staying hydrated with pedialyte.less BMs but not eating much late. He says he has recently last several pounds. Weight in Epic shows he is up several pounds over the last couple of weeks. However, he doesn't recall being weighed in ED earlier this month so ? Validity of results.   Previous Labs / Imaging::    Latest Ref Rng & Units 03/28/2022    3:18 PM 03/21/2022    3:31 PM 10/14/2019    2:59 PM  CBC  WBC 4.0 - 10.5 K/uL 15.4  14.7  16.3   Hemoglobin 13.0 - 17.0 g/dL 40.9  81.1  91.4   Hematocrit 39.0 -  52.0 % 46.7  48.9  51.5   Platelets 150 - 400 K/uL 285  304  378     Lab Results  Component Value Date   LIPASE 35 03/28/2022      Latest Ref Rng & Units 03/28/2022    3:18 PM 03/21/2022    3:31 PM 10/14/2019    2:59 PM  CMP  Glucose 70 - 99 mg/dL 782  956  213   BUN 6 - 20 mg/dL 17  15  15    Creatinine 0.61 - 1.24 mg/dL  0.86  5.78   Sodium 135 - 145 mmol/L 141  141  138   Potassium 3.5 - 5.1 mmol/L 3.5  4.0  3.9   Chloride 98 - 111 mmol/L 101  99  101   CO2 22 - 32 mmol/L 22  25  24    Calcium 8.9 - 10.3 mg/dL 4.69   9.8   Total Protein 6.5 - 8.1 g/dL 7.7  8.7  8.2   Total Bilirubin 0.3 - 1.2 mg/dL 1.1  1.7  1.4   Alkaline Phos 38 - 126 U/L 53  73  60   AST 15 - 41 U/L 14  20  17    ALT 0 - 44 U/L 19  20  17      Previous GI Evaluation   2017 EGD -Grade B esophagitis  Surgical [P], distal esophagus - GASTROESOPHAGEAL JUNCTION MUCOSA WITH MILD INFLAMMATION CONSISTENT WITH GASTROESOPHAGEAL REFLUX. - THERE IS NO EVIDENCE OF GOBLET CELL METAPLASIA, DYSPLASIA, OR MALIGNANCY   Past Medical History:  Diagnosis Date   Childhood asthma    Environmental allergies    GERD (gastroesophageal reflux disease)    Headache    migraines   Seasonal allergic conjunctivitis    Past Surgical History:  Procedure Laterality Date   Left Knee Menuiscus     "several surguries on same knee"   NM ESOPHAGEAL REFLUX     Right Shoulder Surgery     TONSILLECTOMY     adenoids   Family History  Problem Relation Age of Onset   Hypertension Father    Hyperlipidemia Father        Living   Liver disease Father    Fibromyalgia Mother    Thyroid disease Mother        Living   Lupus Mother    Anxiety disorder Mother    Clotting disorder Mother    Crohn's disease Mother    Bone cancer Paternal Grandmother    Heart disease Paternal Grandfather    Diabetes Paternal Grandfather    Diabetes Maternal Grandmother    Hypertension Maternal Grandmother    Liver disease Maternal  Grandmother    Anxiety disorder Maternal Aunt  Depression Maternal Aunt    Lupus Maternal Aunt    Diabetes Paternal Aunt    Lupus Paternal Aunt    Fibromyalgia Paternal Aunt    Drug abuse Paternal Uncle    ADD / ADHD Brother    Anxiety disorder Sister    Migraines Other        Various   Seizures Brother    Social History   Tobacco Use   Smoking status: Every Day    Packs/day: 1.00    Types: Cigarettes    Last attempt to quit: 04/17/2016    Years since quitting: 5.9   Smokeless tobacco: Never   Tobacco comments:    1 pack every 2-3 days  Vaping Use   Vaping Use: Never used  Substance Use Topics   Alcohol use: Yes    Alcohol/week: 0.0 standard drinks of alcohol    Comment: socially   Drug use: Yes    Types: Marijuana    Comment: Daily   Current Outpatient Medications  Medication Sig Dispense Refill   dicyclomine (BENTYL) 20 MG tablet Take 1 tablet (20 mg total) by mouth 3 (three) times daily as needed for spasms. 20 tablet 0   ondansetron (ZOFRAN-ODT) 4 MG disintegrating tablet Take 1 tablet (4 mg total) by mouth every 8 (eight) hours as needed. 20 tablet 0   pantoprazole (PROTONIX) 40 MG tablet Take 1 tablet (40 mg total) by mouth daily. 30 tablet 0   promethazine (PHENERGAN) 25 MG suppository Place 1 suppository (25 mg total) rectally every 6 (six) hours as needed for nausea or vomiting. 12 each 0   tiZANidine (ZANAFLEX) 4 MG tablet Take 1 tablet (4 mg total) by mouth every 6 (six) hours as needed for muscle spasms. 15 tablet 0   No current facility-administered medications for this visit.   Allergies  Allergen Reactions   Penicillins Hives    Did it involve swelling of the face/tongue/throat, SOB, or low BP? Unknown Did it involve sudden or severe rash/hives, skin peeling, or any reaction on the inside of your mouth or nose? Unknown Did you need to seek medical attention at a hospital or doctor's office? Unknown When did it last happen?   Pt doesn't remember he  said he was a child    If all above answers are "NO", may proceed with cephalosporin use.      Review of Systems: All systems reviewed and negative except where noted in HPI.   Wt Readings from Last 3 Encounters:  03/28/22 184 lb 15.5 oz (83.9 kg)  03/21/22 184 lb 15.5 oz (83.9 kg)  10/19/19 185 lb (83.9 kg)    Physical Exam   BP 120/90 (BP Location: Left Arm, Patient Position: Sitting, Cuff Size: Normal)   Pulse 88   Ht 5\' 11"  (1.803 m) Comment: height measured without shoes  Wt 191 lb 2 oz (86.7 kg)   BMI 26.66 kg/m  Constitutional:  Generally well appearing male in no acute distress. Psychiatric: Pleasant. Normal mood and affect. Behavior is normal. EENT: Pupils normal.  Conjunctivae are normal. No scleral icterus. Neck supple.  Cardiovascular: Normal rate, regular rhythm. No edema Pulmonary/chest: Effort normal and breath sounds normal. No wheezing, rales or rhonchi. Abdominal: Soft, nondistended, nontender. Bowel sounds active throughout. There are no masses palpable. No hepatomegaly. Neurological: Alert and oriented to person place and time. Skin: Skin is warm and dry. No rashes noted.  , NP  04/05/2022, 12:33 PM

## 2022-04-11 ENCOUNTER — Ambulatory Visit: Payer: Managed Care, Other (non HMO) | Admitting: Physician Assistant

## 2022-04-30 ENCOUNTER — Ambulatory Visit (HOSPITAL_COMMUNITY)
Admission: RE | Admit: 2022-04-30 | Discharge: 2022-04-30 | Disposition: A | Discharge status: Home / Self Care | Payer: Managed Care, Other (non HMO) | Source: Ambulatory Visit | Attending: Nurse Practitioner | Admitting: Nurse Practitioner

## 2022-04-30 DIAGNOSIS — R1084 Generalized abdominal pain: Secondary | ICD-10-CM | POA: Insufficient documentation

## 2022-04-30 DIAGNOSIS — R112 Nausea with vomiting, unspecified: Secondary | ICD-10-CM | POA: Insufficient documentation

## 2022-04-30 MED ORDER — TECHNETIUM TC 99M SULFUR COLLOID
2.0000 | Freq: Once | INTRAVENOUS | Status: AC | PRN
  Administered 2022-04-30: 2 via ORAL

## 2022-05-23 ENCOUNTER — Ambulatory Visit (INDEPENDENT_AMBULATORY_CARE_PROVIDER_SITE_OTHER): Payer: Managed Care, Other (non HMO) | Admitting: Internal Medicine

## 2022-05-23 ENCOUNTER — Encounter: Payer: Self-pay | Admitting: Internal Medicine

## 2022-05-23 ENCOUNTER — Other Ambulatory Visit (INDEPENDENT_AMBULATORY_CARE_PROVIDER_SITE_OTHER): Payer: Managed Care, Other (non HMO)

## 2022-05-23 VITALS — BP 126/80 | HR 100 | Ht 71.0 in | Wt 203.6 lb

## 2022-05-23 DIAGNOSIS — R112 Nausea with vomiting, unspecified: Secondary | ICD-10-CM

## 2022-05-23 DIAGNOSIS — D72829 Elevated white blood cell count, unspecified: Secondary | ICD-10-CM | POA: Diagnosis not present

## 2022-05-23 DIAGNOSIS — R739 Hyperglycemia, unspecified: Secondary | ICD-10-CM

## 2022-05-23 DIAGNOSIS — K5900 Constipation, unspecified: Secondary | ICD-10-CM | POA: Diagnosis not present

## 2022-05-23 DIAGNOSIS — Z87898 Personal history of other specified conditions: Secondary | ICD-10-CM

## 2022-05-23 LAB — COMPREHENSIVE METABOLIC PANEL
ALT: 14 U/L (ref 0–53)
AST: 15 U/L (ref 0–37)
Albumin: 4.2 g/dL (ref 3.5–5.2)
Alkaline Phosphatase: 60 U/L (ref 39–117)
BUN: 12 mg/dL (ref 6–23)
CO2: 27 mEq/L (ref 19–32)
Calcium: 9.1 mg/dL (ref 8.4–10.5)
Chloride: 105 mEq/L (ref 96–112)
Creatinine, Ser: 0.95 mg/dL (ref 0.40–1.50)
GFR: 111.18 mL/min (ref >60.00)
Glucose, Bld: 91 mg/dL (ref 70–99)
Potassium: 4 mEq/L (ref 3.5–5.1)
Sodium: 138 mEq/L (ref 135–145)
Total Bilirubin: 0.9 mg/dL (ref 0.2–1.2)
Total Protein: 7.1 g/dL (ref 6.0–8.3)

## 2022-05-23 LAB — CBC WITH DIFFERENTIAL/PLATELET
Basophils Absolute: 0.1 10*3/uL (ref 0.0–0.1)
Basophils Relative: 0.7 % (ref 0.0–3.0)
Eosinophils Absolute: 0.2 10*3/uL (ref 0.0–0.7)
Eosinophils Relative: 1.9 % (ref 0.0–5.0)
HCT: 45.4 % (ref 39.0–52.0)
Hemoglobin: 15.1 g/dL (ref 13.0–17.0)
Lymphocytes Relative: 28.4 % (ref 12.0–46.0)
Lymphs Abs: 3 10*3/uL (ref 0.7–4.0)
MCHC: 33.3 g/dL (ref 30.0–36.0)
MCV: 88.4 fl (ref 78.0–100.0)
Monocytes Absolute: 0.6 10*3/uL (ref 0.1–1.0)
Monocytes Relative: 5.6 % (ref 3.0–12.0)
Neutro Abs: 6.8 10*3/uL (ref 1.4–7.7)
Neutrophils Relative %: 63.4 % (ref 43.0–77.0)
Platelets: 262 10*3/uL (ref 150.0–400.0)
RBC: 5.13 Mil/uL (ref 4.22–5.81)
RDW: 13.4 % (ref 11.5–15.5)
WBC: 10.7 10*3/uL — ABNORMAL HIGH (ref 4.0–10.5)

## 2022-05-23 LAB — HEMOGLOBIN A1C: Hgb A1c MFr Bld: 5.2 % (ref 4.6–6.5)

## 2022-05-23 MED ORDER — PANTOPRAZOLE SODIUM 40 MG PO TBEC: 40.0000 mg | DELAYED_RELEASE_TABLET | Freq: Every day | ORAL | 3 refills | Status: DC

## 2022-05-23 NOTE — Patient Instructions (Signed)
Your provider has requested that you go to the basement level for lab work before leaving today. Press "B" on the elevator. The lab is located at the first door on the left as you exit the elevator.  Due to recent changes in healthcare laws, you may see the results of your imaging and laboratory studies on MyChart before your provider has had a chance to review them.  We understand that in some cases there may be results that are confusing or concerning to you. Not all laboratory results come back in the same time frame and the provider may be waiting for multiple results in order to interpret others.  Please give [place] 48 hours in order for your provider to thoroughly review all the results before contacting the office for clarification of your results.   We have sent the following medications to your pharmacy for you to pick up at your convenience: Pantoprazole   I appreciate the opportunity to care for you. Stan Head, MD, [facility]

## 2022-05-23 NOTE — Progress Notes (Signed)
Andrew Castillo 26 y.o. 19-Mar-1996 [id number]  Assessment & Plan:   Encounter Diagnoses  Name Primary?   Nausea and vomiting, unspecified vomiting type Yes   Constipation, unspecified constipation type    Hypercalcemia    Leukocytosis, unspecified type    History of seizure    Hyperbilirubinemia    Hyperglycemia      He is significantly improved, fortunately.  I did discourage the use of marijuana but it does not seem like that was the culprit as he had symptoms on it and without it.  He has had hypercalcemia in the past with the labs, albumin was up question in relation to dehydration as all of the labs we have previously or at least recently and previously were when he was ill.  Same for the leukocytosis question.  Reassess with labs.  I have some concern he could be diabetic or prediabetic.    Orders Placed This Encounter  Procedures   CBC with Differential/Platelet   Comprehensive metabolic panel   HgB A1c   Bilirubin, fractionated(tot/dir/indir)   Meds ordered this encounter  Medications   pantoprazole (PROTONIX) 40 MG tablet    Sig: Take 1 tablet (40 mg total) by mouth daily.    Dispense:  90 tablet    Refill:  3       Subjective:   Chief Complaint: Follow-up of nausea and vomiting  HPI 26 year old man with a history of THC use chronic nausea and vomiting seen by Andrew Castillo in this clinic 04/05/2022.  HPI as below:    "He was evaluated here in 2017 Andrew Castillo) for nausea / vomiting. Underwent EGD with findings of Grade B esophagitis.  Esophageal biopsies compatible with reflux esophagitis  We have not seen him since 2017 but he has continued to be evaluated in  in ED for abdominal pain, nausea and vomiting. On one of those occasions in 2021 at CTAP was done and unrevealing except for ? Mesenteric adenitis. At various time he has had mildly hyperbilirubinemia ( < 2). Additionally his WBC is often elevated as high at 16K    Interval history: He hasn't  been able to eat a full meal for several weeks because of mid abdominal pain when he starts to eat. This then leads to nausea / vomiting. He stopped using THC on 7/5 after ED visit for the vomiting. The vomiting persisted, he went back to ED on 7/12. ED gave him phenergan supp, haldol, PPI. He has since gone back to using THC.    Andrew Castillo mentions that he tends to vomit undigested food.  Sometimes he can recognize tood in emesis that he consumed several hours prior. He doesn't take any meds other than that prescribed by ED as listed above. The phenergan suppositories help. He isn't taking the Haldol. He tries to take the pantoprazole but often vomits it up.    He has been constipated lately. He went 3 weeks without a BM.Last weekend he finally had a BM which was loose and oily ( I saw a cell phone photo). He had not taken any laxative / supplements or anything to make the stool oily. He has never had an oily stool up until now.    He is staying hydrated with pedialyte.less BMs but not eating much late. He says he has recently last several pounds. Weight in Epic shows he is up several pounds over the last couple of weeks. However, he doesn't recall being weighed in ED earlier this month so ? Validity  of results."  He had a gastric emptying study after this visit that was normal.  He was advised to stop marijuana which he did for a while but has returned to the use.  He is feeling much better in the last week or 2.  Moving his bowels well and not having the nausea and vomiting.  He took MiraLAX for a while but has not needed it.  He did see some bright red blood with wiping the other day after having some transient diarrhea.  1 or 2 marijuana cigarettes daily at this point.  He says he smokes it to induce his appetite.  He is establishing primary care tomorrow.  He has a history of seizure disorder we think.  He has not seen neurology in many years.  He reminds me that his mom Andrew Castillo and his aunt Andrew Castillo  all have up for gastrointestinal problems (these are also my patients with functional GI disturbance).       Wt Readings from Last 3 Encounters:  05/23/22 203 lb 9.6 oz (92.4 kg)  04/05/22 191 lb 2 oz (86.7 kg)  03/28/22 184 lb 15.5 oz (83.9 kg)    Latest Reference Range & Units 07/15/15 09:41 04/01/16 19:15 05/04/16 10:52 11/12/17 13:44 02/19/18 15:57 10/03/19 18:52 10/06/19 10:03 10/07/19 02:26 10/08/19 09:55 10/14/19 14:59 03/21/22 15:31 03/28/22 15:18  Calcium 8.9 - 10.3 mg/dL 9.7 9.4 9.5 9.6 9.3 9.5 9.3 8.4 (L) 9.0 9.8 10.9 (H) 10.5 (H)  (L): Data is abnormally low (H): Data is abnormally high    Allergies  Allergen Reactions   Penicillins Hives    Did it involve swelling of the face/tongue/throat, SOB, or low BP? Unknown Did it involve sudden or severe rash/hives, skin peeling, or any reaction on the inside of your mouth or nose? Unknown Did you need to seek medical attention at a hospital or doctor's office? Unknown When did it last happen?   Pt doesn't remember he said he was a child    If all above answers are "NO", may proceed with cephalosporin use.    Flexeril [Cyclobenzaprine]     seizures   Current Meds  Medication Sig   famotidine (PEPCID) 20 MG tablet Take 1 tablet (20 mg total) by mouth in the morning.   ondansetron (ZOFRAN-ODT) 4 MG disintegrating tablet Take 1 tablet (4 mg total) by mouth every 8 (eight) hours as needed.   pantoprazole (PROTONIX) 40 MG tablet Take 1 tablet (40 mg total) by mouth daily.   Past Medical History:  Diagnosis Date   Childhood asthma    Environmental allergies    GERD (gastroesophageal reflux disease)    Headache    migraines   Seasonal allergic conjunctivitis    Past Surgical History:  Procedure Laterality Date   Left Knee Menuiscus     "several surguries on same knee"   NM ESOPHAGEAL REFLUX     Right Shoulder Surgery     TONSILLECTOMY     adenoids   Social History   Social History Narrative   Lives with aunt    No caffeine   family history includes ADD / ADHD in his brother; Anxiety disorder in his maternal aunt, mother, and sister; Bone cancer in his paternal grandmother; Clotting disorder in his mother; Crohn's disease in his mother; Depression in his maternal aunt; Diabetes in his maternal grandmother, paternal aunt, and paternal grandfather; Drug abuse in his paternal uncle; Fibromyalgia in his mother and paternal aunt; Heart disease in his paternal grandfather; Hyperlipidemia in  his father; Hypertension in his father and maternal grandmother; Liver disease in his father and maternal grandmother; Lupus in his maternal aunt, mother, and paternal aunt; Migraines in an other family member; Seizures in his brother; Thyroid disease in his mother.   Review of Systems As per HPI  Objective:   Physical Exam BP 126/80   Pulse 100   Ht 5\' 11"  (1.803 m)   Wt 203 lb 9.6 oz (92.4 kg)   SpO2 98%   BMI 28.40 kg/m  Well-developed well-nourished African-American man no acute distress Abdomen is soft nontender without organomegaly or mass

## 2022-05-24 ENCOUNTER — Encounter: Payer: Self-pay | Admitting: Family Medicine

## 2022-05-24 ENCOUNTER — Ambulatory Visit: Payer: Managed Care, Other (non HMO) | Admitting: Family Medicine

## 2022-05-24 VITALS — BP 132/90 | HR 87 | Temp 97.7°F | Ht 71.0 in | Wt 205.0 lb

## 2022-05-24 DIAGNOSIS — J309 Allergic rhinitis, unspecified: Secondary | ICD-10-CM | POA: Diagnosis not present

## 2022-05-24 DIAGNOSIS — F17200 Nicotine dependence, unspecified, uncomplicated: Secondary | ICD-10-CM

## 2022-05-24 DIAGNOSIS — G40909 Epilepsy, unspecified, not intractable, without status epilepticus: Secondary | ICD-10-CM | POA: Diagnosis not present

## 2022-05-24 DIAGNOSIS — F17210 Nicotine dependence, cigarettes, uncomplicated: Secondary | ICD-10-CM | POA: Diagnosis not present

## 2022-05-24 DIAGNOSIS — K219 Gastro-esophageal reflux disease without esophagitis: Secondary | ICD-10-CM | POA: Diagnosis not present

## 2022-05-24 LAB — BILIRUBIN, FRACTIONATED(TOT/DIR/INDIR)
Bilirubin, Direct: 0.1 mg/dL (ref 0.0–0.2)
Indirect Bilirubin: 0.7 mg/dL (calc) (ref 0.2–1.2)
Total Bilirubin: 0.8 mg/dL (ref 0.2–1.2)

## 2022-05-24 MED ORDER — LEVOCETIRIZINE DIHYDROCHLORIDE 5 MG PO TABS: 5.0000 mg | ORAL_TABLET | Freq: Every evening | ORAL | 2 refills | Status: DC

## 2022-05-24 MED ORDER — FLUTICASONE PROPIONATE 50 MCG/ACT NA SUSP: 2.0000 | Freq: Every day | NASAL | 2 refills | Status: DC

## 2022-05-24 NOTE — Progress Notes (Signed)
New Patient Office Visit  Subjective    Patient ID: Andrew Castillo, male    DOB: March 21, 1996  Age: 26 y.o. MRN: [id number]  CC:  Chief Complaint  Patient presents with   Establish Care    No concerns, does go to [facility].     HPI Andrew Castillo presents to establish care  Sees [facility] for GERD and cyclical vomiting.   Hx of seizures since age 45. Last one was in May 2023. Has not seen neurologist in several years. He is not currently on medication for seizures.    States he missed his initial appointment with [facility] due to being sick and having to see gastroenterology.  States he is unable to go back to work due to his seizure and may until he is cleared by neurology.  He works for [employer].  States he has to drive from site to site in order to do his job.  Complains of allergy symptoms.  Would like medication to help control allergies.  Denies fever, chills, dizziness, chest pain, palpitations, shortness of breath, abdominal pain, N/V/D.   Labs done yesterday at GI for my review.    Outpatient Encounter Medications as of 05/24/2022  Medication Sig   fluticasone (FLONASE) 50 MCG/ACT nasal spray Place 2 sprays into both nostrils daily.   levocetirizine (XYZAL) 5 MG tablet Take 1 tablet (5 mg total) by mouth every evening.   pantoprazole (PROTONIX) 40 MG tablet Take 1 tablet (40 mg total) by mouth daily.   dicyclomine (BENTYL) 10 MG capsule Take 1 capsule (10 mg total) by mouth 4 (four) times daily -  before meals and at bedtime. (Patient not taking: Reported on 05/23/2022)   ondansetron (ZOFRAN-ODT) 4 MG disintegrating tablet Take 1 tablet (4 mg total) by mouth every 8 (eight) hours as needed. (Patient not taking: Reported on 05/24/2022)   No facility-administered encounter medications on file as of 05/24/2022.    Past Medical History:  Diagnosis Date   Childhood asthma    Environmental allergies    GERD (gastroesophageal reflux  disease)    Headache    migraines   Seasonal allergic conjunctivitis    Seizures (HCC)     Past Surgical History:  Procedure Laterality Date   ESOPHAGOGASTRODUODENOSCOPY     Left Knee Menuiscus     "several surguries on same knee"   NM ESOPHAGEAL REFLUX     Right Shoulder Surgery     TONSILLECTOMY     adenoids    Family History  Problem Relation Age of Onset   Hypertension Father    Hyperlipidemia Father        Living   Liver disease Father    Fibromyalgia Mother    Thyroid disease Mother        Living   Lupus Mother    Anxiety disorder Mother    Clotting disorder Mother    Crohn's disease Mother    Bone cancer Paternal Grandmother    Heart disease Paternal Grandfather    Diabetes Paternal Grandfather    Diabetes Maternal Grandmother    Hypertension Maternal Grandmother    Liver disease Maternal Grandmother    Anxiety disorder Maternal Aunt    Depression Maternal Aunt    Lupus Maternal Aunt    Diabetes Paternal Aunt    Lupus Paternal Aunt    Fibromyalgia Paternal Aunt    Drug abuse Paternal Uncle    ADD / ADHD Brother    Anxiety disorder  Sister    Migraines Other        Various   Seizures Brother     Social History   Socioeconomic History   Marital status: Single    Spouse name: Not on file   Number of children: 1   Years of education: 12   Highest education level: Not on file  Occupational History   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, [employer]  Tobacco Use   Smoking status: Every Day    Packs/day: 1.00    Types: Cigarettes    Last attempt to quit: 04/17/2016    Years since quitting: 6.1   Smokeless tobacco: Never   Tobacco comments:    1 pack every 2-3 days  Vaping Use   Vaping Use: Never used  Substance and Sexual Activity   Alcohol use: Yes    Alcohol/week: 0.0 standard drinks of alcohol    Comment: socially   Drug use: Yes    Types: Marijuana    Comment: Daily   Sexual activity: Yes  Other Topics Concern   Not on file  Social  History Narrative   Lives with aunt   No caffeine   Social Determinants of Health   Financial Resource Strain: Not on file  Food Insecurity: Not on file  Transportation Needs: Not on file  Physical Activity: Not on file  Stress: Not on file  Social Connections: Not on file  Intimate Partner Violence: Not on file    ROS      Objective    BP (!) 132/90 (BP Location: Left Arm, Patient Position: Sitting, Cuff Size: Large)   Pulse 87   Temp 97.7 F (36.5 C) (Temporal)   Ht 5\' 11"  (1.803 m)   Wt 205 lb (93 kg)   SpO2 98%   BMI 28.59 kg/m   Physical Exam Constitutional:      General: He is not in acute distress.    Appearance: He is not ill-appearing.  Eyes:     Conjunctiva/sclera: Conjunctivae normal.     Pupils: Pupils are equal, round, and reactive to light.  Cardiovascular:     Rate and Rhythm: Normal rate and regular rhythm.  Pulmonary:     Effort: Pulmonary effort is normal.     Breath sounds: Normal breath sounds.  Musculoskeletal:     Cervical back: Normal range of motion and neck supple.  Skin:    General: Skin is warm and dry.  Neurological:     General: No focal deficit present.     Mental Status: He is alert and oriented to person, place, and time.     Cranial Nerves: No cranial nerve deficit.     Sensory: No sensory deficit.     Motor: No weakness.     Coordination: Coordination normal.     Gait: Gait normal.  Psychiatric:        Mood and Affect: Mood normal.        Behavior: Behavior normal.         Assessment & Plan:   Problem List Items Addressed This Visit       Respiratory   Allergic rhinitis    Xyzal and Flonase prescribed.       Relevant Medications   fluticasone (FLONASE) 50 MCG/ACT nasal spray   levocetirizine (XYZAL) 5 MG tablet     Digestive   Esophageal reflux    Followed by GI        Nervous and Auditory   Seizure disorder (HCC) - Primary  Urgent referral to neurologist. He is unable to drive and therefore  work until he is evaluated and cleared to do so by neurology. Last seizure May 2023.       Relevant Orders   Ambulatory referral to Neurology     Other   Tobacco use disorder    Recommend smoking cessation       Reviewed notes and results from the ED and GI. Reviewed labs with patient from yesterday's visit from GI.   Return if symptoms worsen or fail to improve.   Hetty Blend, NP-C

## 2022-05-24 NOTE — Assessment & Plan Note (Signed)
Urgent referral to neurologist. He is unable to drive and therefore work until he is evaluated and cleared to do so by neurology. Last seizure May 2023.

## 2022-05-24 NOTE — Assessment & Plan Note (Signed)
Xyzal and Flonase prescribed.

## 2022-05-24 NOTE — Assessment & Plan Note (Signed)
Followed by GI

## 2022-05-24 NOTE — Assessment & Plan Note (Signed)
Recommend smoking cessation.

## 2022-05-24 NOTE — Patient Instructions (Addendum)
I referred you to [facility] neurology and you should hear from them soon.  Make sure to keep your appointment with them.  Try the Flonase and Xyzal prescriptions for your allergies.  Follow-up if they are not improving.  Your blood pressure is elevated today.  Please get a blood pressure cuff and check your blood pressure at home.  If your blood pressure is consistently higher than 130/80, please follow-up.  Stopping smoking will help lower your blood pressure and your overall health.

## 2022-06-02 ENCOUNTER — Other Ambulatory Visit: Payer: Self-pay | Admitting: Nurse Practitioner

## 2022-06-02 ENCOUNTER — Other Ambulatory Visit: Payer: Self-pay | Admitting: Family Medicine

## 2022-06-02 DIAGNOSIS — J309 Allergic rhinitis, unspecified: Secondary | ICD-10-CM

## 2022-06-05 ENCOUNTER — Ambulatory Visit
Admission: EM | Admit: 2022-06-05 | Discharge: 2022-06-05 | Discharge status: Absent without leave | Payer: Managed Care, Other (non HMO)

## 2022-06-06 ENCOUNTER — Telehealth: Payer: Self-pay

## 2022-06-26 ENCOUNTER — Encounter: Payer: Self-pay | Admitting: Neurology

## 2022-06-26 ENCOUNTER — Telehealth: Payer: Self-pay | Admitting: Neurology

## 2022-06-26 ENCOUNTER — Ambulatory Visit (INDEPENDENT_AMBULATORY_CARE_PROVIDER_SITE_OTHER): Payer: Managed Care, Other (non HMO) | Admitting: Neurology

## 2022-06-26 VITALS — BP 120/82 | HR 73 | Ht 72.0 in | Wt 206.0 lb

## 2022-06-26 DIAGNOSIS — G40909 Epilepsy, unspecified, not intractable, without status epilepticus: Secondary | ICD-10-CM | POA: Diagnosis not present

## 2022-06-26 MED ORDER — APTIOM 600 MG PO TABS: 600.0000 mg | ORAL_TABLET | Freq: Every evening | ORAL | 11 refills | Status: AC

## 2022-06-26 NOTE — Progress Notes (Signed)
[facility]  PATIENT: Andrew Castillo DOB: 1996/08/05  REQUESTING CLINICIAN: Avanell Shackleton, NP-C HISTORY FROM: Patient  REASON FOR VISIT: Establish care for epilepsy    HISTORICAL  CHIEF COMPLAINT:  Chief Complaint  Patient presents with   New Patient (Initial Visit)    Rm 14. Alone. NP/internal [facility] referral for seizures.    HISTORY OF PRESENT ILLNESS:  This is a 26 year old man with past medical history of vomiting, seizures who is presenting to establish care  for seizure disorder.  Patient reports the first seizure happened in 2017, it came out of nowhere.  He reported prior to the seizure he was up all night and was drinking.  Since then he had additional seizures and the seizures do happen with sleep deprivation.  His last seizure was in May.  He reported he was at a golf tournament, again he was up all night drinking and had a seizure on the golf course.  He was told with his seizure he will have behavioral arrest, he will stop talking, and his head would deviate to the left before he falls down and start shaking.  He does have foaming at the mouth and tongue biting associated with the seizures.  Denies any injury with the seizure but states that mother and brother have epilepsy.  He has never been started on antiseizure medication currently is not taking anything.   Handedness: Right  Onset: 2017   Seizure Type: Behavioral arrest, head deviation to the left and generalized shaking with foaming at the mouth and tongue biting   Current frequency: Last seizure was in May  Any injuries from seizures: None   Seizure risk factors: Mother and brother with seizure,   Previous ASMs: None   Currenty ASMs: None   ASMs side effects: N/A   Brain Images: Not available for review  Previous EEGs: Not available   OTHER MEDICAL CONDITIONS: GI issues, vomiting  REVIEW OF SYSTEMS: Full 14 system review of systems performed and negative with  exception of: As noted in the HPI   ALLERGIES: Allergies  Allergen Reactions   Penicillins Hives    Did it involve swelling of the face/tongue/throat, SOB, or low BP? Unknown Did it involve sudden or severe rash/hives, skin peeling, or any reaction on the inside of your mouth or nose? Unknown Did you need to seek medical attention at a hospital or doctor's office? Unknown When did it last happen?   Pt doesn't remember he said he was a child    If all above answers are "NO", may proceed with cephalosporin use.    Flexeril [Cyclobenzaprine]     seizures    HOME MEDICATIONS: Outpatient Medications Prior to Visit  Medication Sig Dispense Refill   dicyclomine (BENTYL) 10 MG capsule Take 1 capsule (10 mg total) by mouth 4 (four) times daily -  before meals and at bedtime. 40 capsule 3   fluticasone (FLONASE) 50 MCG/ACT nasal spray Place 2 sprays into both nostrils daily. 16 g 2   levocetirizine (XYZAL) 5 MG tablet Take 1 tablet (5 mg total) by mouth every evening. 30 tablet 2   ondansetron (ZOFRAN-ODT) 4 MG disintegrating tablet Take 1 tablet (4 mg total) by mouth every 8 (eight) hours as needed. 20 tablet 0   pantoprazole (PROTONIX) 40 MG tablet Take 1 tablet (40 mg total) by mouth daily. 90 tablet 3   No facility-administered medications prior to visit.    PAST MEDICAL HISTORY: Past Medical History:  Diagnosis  Date   Childhood asthma    Environmental allergies    GERD (gastroesophageal reflux disease)    Headache    migraines   Seasonal allergic conjunctivitis    Seizures (HCC)     PAST SURGICAL HISTORY: Past Surgical History:  Procedure Laterality Date   ESOPHAGOGASTRODUODENOSCOPY     Left Knee Menuiscus     "several surguries on same knee"   NM ESOPHAGEAL REFLUX     Right Shoulder Surgery     TONSILLECTOMY     adenoids    FAMILY HISTORY: Family History  Problem Relation Age of Onset   Hypertension Father    Hyperlipidemia Father        Living   Liver disease  Father    Fibromyalgia Mother    Thyroid disease Mother        Living   Lupus Mother    Anxiety disorder Mother    Clotting disorder Mother    Crohn's disease Mother    Bone cancer Paternal Grandmother    Heart disease Paternal Grandfather    Diabetes Paternal Grandfather    Diabetes Maternal Grandmother    Hypertension Maternal Grandmother    Liver disease Maternal Grandmother    Anxiety disorder Maternal Aunt    Depression Maternal Aunt    Lupus Maternal Aunt    Diabetes Paternal Aunt    Lupus Paternal Aunt    Fibromyalgia Paternal Aunt    Drug abuse Paternal Uncle    ADD / ADHD Brother    Anxiety disorder Sister    Migraines Other        Various   Seizures Brother     SOCIAL HISTORY: Social History   Socioeconomic History   Marital status: Single    Spouse name: Not on file   Number of children: 1   Years of education: 12   Highest education level: Not on file  Occupational History   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, [employer]  Tobacco Use   Smoking status: Every Day    Packs/day: 1.00    Types: Cigarettes    Last attempt to quit: 04/17/2016    Years since quitting: 6.1   Smokeless tobacco: Never   Tobacco comments:    1 pack every 2-3 days  Vaping Use   Vaping Use: Never used  Substance and Sexual Activity   Alcohol use: Yes    Alcohol/week: 0.0 standard drinks of alcohol    Comment: socially   Drug use: Yes    Types: Marijuana    Comment: Daily   Sexual activity: Yes  Other Topics Concern   Not on file  Social History Narrative   Lives with aunt   No caffeine   Social Determinants of Health   Financial Resource Strain: Not on file  Food Insecurity: Not on file  Transportation Needs: Not on file  Physical Activity: Not on file  Stress: Not on file  Social Connections: Not on file  Intimate Partner Violence: Not on file    PHYSICAL EXAM  GENERAL EXAM/CONSTITUTIONAL: Vitals:  Vitals:   06/26/22 0930  BP: 120/82  Pulse: 73  Weight:  206 lb (93.4 kg)  Height: 6' (1.829 m)   Body mass index is 27.94 kg/m. Wt Readings from Last 3 Encounters:  06/26/22 206 lb (93.4 kg)  05/24/22 205 lb (93 kg)  05/23/22 203 lb 9.6 oz (92.4 kg)   Patient is in no distress; well developed, nourished and groomed; neck is supple  EYES: Pupils round and reactive  to light, Visual fields full to confrontation, Extraocular movements intacts,  No results found.  MUSCULOSKELETAL: Gait, strength, tone, movements noted in Neurologic exam below  NEUROLOGIC: MENTAL STATUS:      No data to display         awake, alert, oriented to person, place and time recent and remote memory intact normal attention and concentration language fluent, comprehension intact, naming intact fund of knowledge appropriate  CRANIAL NERVE:  2nd, 3rd, 4th, 6th - pupils equal and reactive to light, visual fields full to confrontation, extraocular muscles intact, no nystagmus 5th - facial sensation symmetric 7th - facial strength symmetric 8th - hearing intact 9th - palate elevates symmetrically, uvula midline 11th - shoulder shrug symmetric 12th - tongue protrusion midline  MOTOR:  normal bulk and tone, full strength in the BUE, BLE  SENSORY:  normal and symmetric to light touch  COORDINATION:  finger-nose-finger, fine finger movements normal  REFLEXES:  deep tendon reflexes present and symmetric  GAIT/STATION:  normal    DIAGNOSTIC DATA (LABS, IMAGING, TESTING) - I reviewed patient records, labs, notes, testing and imaging myself where available.  Lab Results  Component Value Date   WBC 10.7 (H) 05/23/2022   HGB 15.1 05/23/2022   HCT 45.4 05/23/2022   MCV 88.4 05/23/2022   PLT 262.0 05/23/2022      Component Value Date/Time   NA 138 05/23/2022 1205   K 4.0 05/23/2022 1205   CL 105 05/23/2022 1205   CO2 27 05/23/2022 1205   GLUCOSE 91 05/23/2022 1205   BUN 12 05/23/2022 1205   CREATININE 0.95 05/23/2022 1205   CALCIUM 9.1  05/23/2022 1205   PROT 7.1 05/23/2022 1205   ALBUMIN 4.2 05/23/2022 1205   AST 15 05/23/2022 1205   ALT 14 05/23/2022 1205   ALKPHOS 60 05/23/2022 1205   BILITOT 0.8 05/23/2022 1205   BILITOT 0.9 05/23/2022 1205   GFRNONAA >60 03/28/2022 1518   GFRAA >60 10/14/2019 1459   Lab Results  Component Value Date   CHOL 168 05/04/2016   HDL 41.60 05/04/2016   LDLCALC 105 (H) 05/04/2016   TRIG 109.0 05/04/2016   Lab Results  Component Value Date   HGBA1C 5.2 05/23/2022   No results found for: "VITAMINB12" Lab Results  Component Value Date   TSH 1.19 07/15/2015     ASSESSMENT AND PLAN  26 y.o. year old male  with history of epilepsy and recurrent vomiting who is presenting to establish care for his epilepsy.  Seizures started in 2017, last seizure was in May of this year.  He reported the seizures are usually associated with sleep deprivation.  He has never been started on any antiseizure medication.  I will start the patient on Aptiom 600 mg nightly (once a day and also can help with anxiety) and will increase as tolerated.  Advised him to contact me if he has a breakthrough seizure.  I will also obtain routine EEG and MRI brain with and without contrast.  He voices understanding.  I will see him in 3 months for follow-up   1. Seizure disorder [facility])     Patient Instructions  Start with Aptiom 600 mg nightly Routine EEG MRI brain with and without contrast epilepsy protocol Contact us if you have breakthrough seizure Follow-up in 3 months or sooner if worse   Per [facility] statutes, patients with seizures are not allowed to drive until they have been seizure-free for six months.  Other recommendations include using caution when  using heavy equipment or power tools. Avoid working on ladders or at heights. Take showers instead of baths.  Do not swim alone.  Ensure the water temperature is not too high on the home water heater. Do not go swimming alone. Do not lock yourself in  a room alone (i.e. bathroom). When caring for infants or small children, sit down when holding, feeding, or changing them to minimize risk of injury to the child in the event you have a seizure. Maintain good sleep hygiene. Avoid alcohol.  Also recommend adequate sleep, hydration, good diet and minimize stress.   During the Seizure  - First, ensure adequate ventilation and place patients on the floor on their left side  Loosen clothing around the neck and ensure the airway is patent. If the patient is clenching the teeth, do not force the mouth open with any object as this can cause severe damage - Remove all items from the surrounding that can be hazardous. The patient may be oblivious to what's happening and may not even know what he or she is doing. If the patient is confused and wandering, either gently guide him/her away and block access to outside areas - Reassure the individual and be comforting - Call 911. In most cases, the seizure ends before EMS arrives. However, there are cases when seizures may last over 3 to 5 minutes. Or the individual may have developed breathing difficulties or severe injuries. If a pregnant patient or a person with diabetes develops a seizure, it is prudent to call an ambulance. - Finally, if the patient does not regain full consciousness, then call EMS. Most patients will remain confused for about 45 to 90 minutes after a seizure, so you must use judgment in calling for help. - Avoid restraints but make sure the patient is in a bed with padded side rails - Place the individual in a lateral position with the neck slightly flexed; this will help the saliva drain from the mouth and prevent the tongue from falling backward - Remove all nearby furniture and other hazards from the area - Provide verbal assurance as the individual is regaining consciousness - Provide the patient with privacy if possible - Call for help and start treatment as ordered by the caregiver    After the Seizure (Postictal Stage)  After a seizure, most patients experience confusion, fatigue, muscle pain and/or a headache. Thus, one should permit the individual to sleep. For the next few days, reassurance is essential. Being calm and helping reorient the person is also of importance.  Most seizures are painless and end spontaneously. Seizures are not harmful to others but can lead to complications such as stress on the lungs, brain and the heart. Individuals with prior lung problems may develop labored breathing and respiratory distress.     Orders Placed This Encounter  Procedures   MR BRAIN W WO CONTRAST   EEG adult    Meds ordered this encounter  Medications   Eslicarbazepine Acetate (APTIOM) 600 MG TABS    Sig: Take 600 mg by mouth at bedtime.    Dispense:  30 tablet    Refill:  11    Return in about 3 months (around 09/26/2022).    Windell Norfolk, MD 06/26/2022, 10:12 AM  [facility] [address] [place], [place] [address] [phone]

## 2022-06-26 NOTE — Patient Instructions (Signed)
Start with Aptiom 600 mg nightly Routine EEG MRI brain with and without contrast epilepsy protocol Contact us if you have breakthrough seizure Follow-up in 3 months or sooner if worse

## 2022-06-26 NOTE — Telephone Encounter (Signed)
[employer] sent to GI they obtain auth [id number] 

## 2022-07-01 ENCOUNTER — Other Ambulatory Visit: Payer: Self-pay | Admitting: Nurse Practitioner

## 2022-07-04 ENCOUNTER — Other Ambulatory Visit: Payer: Managed Care, Other (non HMO) | Admitting: *Deleted

## 2022-07-04 ENCOUNTER — Telehealth: Payer: Self-pay | Admitting: Neurology

## 2022-07-04 NOTE — Telephone Encounter (Signed)
LVM and sent mychart msg informing pt of EEG cancellation- Claiborne Billings out sick.

## 2022-07-12 ENCOUNTER — Ambulatory Visit (INDEPENDENT_AMBULATORY_CARE_PROVIDER_SITE_OTHER): Payer: Managed Care, Other (non HMO) | Admitting: Neurology

## 2022-07-12 DIAGNOSIS — G40909 Epilepsy, unspecified, not intractable, without status epilepticus: Secondary | ICD-10-CM | POA: Diagnosis not present

## 2022-07-12 NOTE — Procedures (Signed)
    History:  26 year old gentleman with seizure  EEG classification: Awake and drowsy  Description of the recording: The background rhythms of this recording consists of a fairly well modulated medium amplitude alpha rhythm of 9-10 Hz that is reactive to eye opening and closure. Present in the anterior head region is a 15-20 Hz beta activity. Photic stimulation was performed, did not show any abnormalities. Hyperventilation was also performed, did not show any abnormalities. Drowsiness was manifested by background fragmentation. No abnormal epileptiform discharges seen during this recording. There was left frontal slowing. There were no electrographic seizure identified.   Abnormality: Left frontal slowing    Impression: This is an abnormal EEG recorded while drowsy and awake due to presence of left frontal slowing which is consistent with an area of neuronal dysfunction in the left frontal region.    Alric Ran, MD [facility]

## 2022-07-25 ENCOUNTER — Telehealth: Payer: Managed Care, Other (non HMO) | Admitting: Physician Assistant

## 2022-07-25 ENCOUNTER — Emergency Department (HOSPITAL_COMMUNITY)
Admission: EM | Admit: 2022-07-25 | Discharge: 2022-07-26 | Discharge status: Against Medical Advice | Payer: Managed Care, Other (non HMO) | Attending: Physician Assistant | Admitting: Physician Assistant

## 2022-07-25 ENCOUNTER — Other Ambulatory Visit: Payer: Self-pay

## 2022-07-25 ENCOUNTER — Encounter (HOSPITAL_COMMUNITY): Payer: Self-pay | Admitting: Emergency Medicine

## 2022-07-25 DIAGNOSIS — R109 Unspecified abdominal pain: Secondary | ICD-10-CM | POA: Diagnosis not present

## 2022-07-25 DIAGNOSIS — M545 Low back pain, unspecified: Secondary | ICD-10-CM | POA: Insufficient documentation

## 2022-07-25 DIAGNOSIS — R197 Diarrhea, unspecified: Secondary | ICD-10-CM | POA: Diagnosis not present

## 2022-07-25 DIAGNOSIS — Z5321 Procedure and treatment not carried out due to patient leaving prior to being seen by health care provider: Secondary | ICD-10-CM | POA: Diagnosis not present

## 2022-07-25 DIAGNOSIS — K920 Hematemesis: Secondary | ICD-10-CM | POA: Insufficient documentation

## 2022-07-25 DIAGNOSIS — R1115 Cyclical vomiting syndrome unrelated to migraine: Secondary | ICD-10-CM

## 2022-07-25 LAB — COMPREHENSIVE METABOLIC PANEL
ALT: 48 U/L — ABNORMAL HIGH (ref 0–44)
AST: 36 U/L (ref 15–41)
Albumin: 5.1 g/dL — ABNORMAL HIGH (ref 3.5–5.0)
Alkaline Phosphatase: 66 U/L (ref 38–126)
Anion gap: 14 (ref 5–15)
BUN: 11 mg/dL (ref 6–20)
CO2: 21 mmol/L — ABNORMAL LOW (ref 22–32)
Calcium: 10.2 mg/dL (ref 8.9–10.3)
Chloride: 107 mmol/L (ref 98–111)
Creatinine, Ser: 0.98 mg/dL (ref 0.61–1.24)
GFR, Estimated: >60 mL/min (ref >60)
Glucose, Bld: 158 mg/dL — ABNORMAL HIGH (ref 70–99)
Potassium: 4.4 mmol/L (ref 3.5–5.1)
Sodium: 142 mmol/L (ref 135–145)
Total Bilirubin: 1.1 mg/dL (ref 0.3–1.2)
Total Protein: 8.4 g/dL — ABNORMAL HIGH (ref 6.5–8.1)

## 2022-07-25 LAB — CBC WITH DIFFERENTIAL/PLATELET
Abs Immature Granulocytes: 0.11 10*3/uL — ABNORMAL HIGH (ref 0.00–0.07)
Basophils Absolute: 0 10*3/uL (ref 0.0–0.1)
Basophils Relative: 0 %
Eosinophils Absolute: 0 10*3/uL (ref 0.0–0.5)
Eosinophils Relative: 0 %
HCT: 53.1 % — ABNORMAL HIGH (ref 39.0–52.0)
Hemoglobin: 17.3 g/dL — ABNORMAL HIGH (ref 13.0–17.0)
Immature Granulocytes: 1 %
Lymphocytes Relative: 6 %
Lymphs Abs: 1.2 10*3/uL (ref 0.7–4.0)
MCH: 28.8 pg (ref 26.0–34.0)
MCHC: 32.6 g/dL (ref 30.0–36.0)
MCV: 88.5 fL (ref 80.0–100.0)
Monocytes Absolute: 0.5 10*3/uL (ref 0.1–1.0)
Monocytes Relative: 3 %
Neutro Abs: 18.4 10*3/uL — ABNORMAL HIGH (ref 1.7–7.7)
Neutrophils Relative %: 90 %
Platelets: 339 10*3/uL (ref 150–400)
RBC: 6 MIL/uL — ABNORMAL HIGH (ref 4.22–5.81)
RDW: 12.7 % (ref 11.5–15.5)
WBC: 20.2 10*3/uL — ABNORMAL HIGH (ref 4.0–10.5)
nRBC: 0 % (ref 0.0–0.2)

## 2022-07-25 LAB — LIPASE, BLOOD: Lipase: 31 U/L (ref 11–51)

## 2022-07-25 MED ORDER — PROMETHAZINE HCL 25 MG RE SUPP: 25.0000 mg | Freq: Four times a day (QID) | RECTAL | 0 refills | Status: DC | PRN

## 2022-07-25 MED ORDER — ONDANSETRON 4 MG PO TBDP
4.0000 mg | ORAL_TABLET | Freq: Once | ORAL | Status: AC
  Administered 2022-07-25: 4 mg via ORAL

## 2022-07-25 NOTE — Progress Notes (Signed)
Virtual Visit Consent   Andrew Castillo, you are scheduled for a virtual visit with a [facility] provider today. Just as with appointments in the office, your consent must be obtained to participate. Your consent will be active for this visit and any virtual visit you may have with one of our providers in the next 365 days. If you have a MyChart account, a copy of this consent can be sent to you electronically.  As this is a virtual visit, video technology does not allow for your provider to perform a traditional examination. This may limit your provider's ability to fully assess your condition. If your provider identifies any concerns that need to be evaluated in person or the need to arrange testing (such as labs, EKG, etc.), we will make arrangements to do so. Although advances in technology are sophisticated, we cannot ensure that it will always work on either your end or our end. If the connection with a video visit is poor, the visit may have to be switched to a telephone visit. With either a video or telephone visit, we are not always able to ensure that we have a secure connection.  By engaging in this virtual visit, you consent to the provision of healthcare and authorize for your insurance to be billed (if applicable) for the services provided during this visit. Depending on your insurance coverage, you may receive a charge related to this service.  I need to obtain your verbal consent now. Are you willing to proceed with your visit today? Andrew Castillo has provided verbal consent on 07/25/2022 for a virtual visit (video or telephone). Margaretann Loveless, PA-C  Date: 07/25/2022 5:15 PM  Virtual Visit via Video Note   I, Margaretann Loveless, connected with  Andrew Castillo  ([id number], [birth year]) on 07/25/22 at  5:15 PM EST by a video-enabled telemedicine application and verified that I am speaking with the correct person using two identifiers.  Location: Patient: Virtual Visit  Location Patient: Home Provider: Virtual Visit Location Provider: Home Office   I discussed the limitations of evaluation and management by telemedicine and the availability of in person appointments. The patient expressed understanding and agreed to proceed.    History of Present Illness: Andrew Castillo is a 26 y.o. who identifies as a male who was assigned male at birth, and is being seen today for nausea and vomiting. He does have a history, last flares was over the summer in July, of cyclical vomiting. He has seen GI previously. He reports this episode started today and he has been vomiting often without relief. He has tried his dicyclomine but is unable to keep that down. He is requesting promethazine suppositories as these have helped him in the past. He is having trouble keeping all liquids down, but is taking small sips. He has urinated. He is having chills and sweats and cramping in his stomach.    Problems:  Patient Active Problem List   Diagnosis Date Noted   Seizure disorder (HCC) 05/24/2022   Enteritis 10/06/2019   Tobacco use disorder 06/24/2019   Intractable nausea and vomiting 05/02/2016   Esophageal reflux 05/02/2016   Visit for preventive health examination 04/30/2016   Seizure-like activity (HCC) 04/30/2016   Anxiety and depression 11/21/2015   Gastroesophageal reflux disease with esophagitis 07/15/2015   Inattention 07/15/2015   Allergic rhinitis 11/14/2006   ASTHMA, UNSPECIFIED 11/14/2006   ECZEMA, ATOPIC DERMATITIS 11/14/2006    Allergies:  Allergies  Allergen Reactions   Penicillins  Hives    Did it involve swelling of the face/tongue/throat, SOB, or low BP? Unknown Did it involve sudden or severe rash/hives, skin peeling, or any reaction on the inside of your mouth or nose? Unknown Did you need to seek medical attention at a hospital or doctor's office? Unknown When did it last happen?   Pt doesn't remember he said he was a child    If all above answers are  "NO", may proceed with cephalosporin use.    Flexeril [Cyclobenzaprine]     seizures   Medications:  Current Outpatient Medications:    promethazine (PHENERGAN) 25 MG suppository, Place 1 suppository (25 mg total) rectally every 6 (six) hours as needed for nausea or vomiting., Disp: 12 each, Rfl: 0   dicyclomine (BENTYL) 10 MG capsule, Take 1 capsule (10 mg total) by mouth 4 (four) times daily -  before meals and at bedtime., Disp: 40 capsule, Rfl: 3   Eslicarbazepine Acetate (APTIOM) 600 MG TABS, Take 600 mg by mouth at bedtime., Disp: 30 tablet, Rfl: 11   fluticasone (FLONASE) 50 MCG/ACT nasal spray, Place 2 sprays into both nostrils daily., Disp: 16 g, Rfl: 2   levocetirizine (XYZAL) 5 MG tablet, Take 1 tablet (5 mg total) by mouth every evening., Disp: 30 tablet, Rfl: 2   pantoprazole (PROTONIX) 40 MG tablet, Take 1 tablet (40 mg total) by mouth daily., Disp: 90 tablet, Rfl: 3  Observations/Objective: Patient is well-developed, well-nourished in no acute distress.  Resting comfortably at home.  Head is normocephalic, atraumatic.  No labored breathing.  Speech is clear and coherent with logical content.  Patient is alert and oriented at baseline.  Did vomit and dry heave while on call  Assessment and Plan: 1. Cyclic vomiting syndrome - promethazine (PHENERGAN) 25 MG suppository; Place 1 suppository (25 mg total) rectally every 6 (six) hours as needed for nausea or vomiting.  Dispense: 12 each; Refill: 0  - Promethazine supp refilled - Advised to push fluids; electrolyte beverages - Continue Bentyl as needed for cramping - Seek in person evaluation if vomiting continues, unable to tolerate liquids, or if goes a period greater than 8 hours without urinating  Follow Up Instructions: I discussed the assessment and treatment plan with the patient. The patient was provided an opportunity to ask questions and all were answered. The patient agreed with the plan and demonstrated an  understanding of the instructions.  A copy of instructions were sent to the patient via MyChart unless otherwise noted below.    The patient was advised to call back or seek an in-person evaluation if the symptoms worsen or if the condition fails to improve as anticipated.  Time:  I spent 10 minutes with the patient via telehealth technology discussing the above problems/concerns.    Margaretann Loveless, PA-C

## 2022-07-25 NOTE — Patient Instructions (Signed)
Andrew Castillo, thank you for joining Margaretann Loveless, PA-C for today's virtual visit.  While this provider is not your primary care provider (PCP), if your PCP is located in our provider database this encounter information will be shared with them immediately following your visit.   A [facility] MyChart account gives you access to today's visit and all your visits, tests, and labs performed at [facility] " click here if you don't have a [facility] MyChart account or go to [email or web address]  Consent: (Patient) Andrew Castillo provided verbal consent for this virtual visit at the beginning of the encounter.  Current Medications:  Current Outpatient Medications:    promethazine (PHENERGAN) 25 MG suppository, Place 1 suppository (25 mg total) rectally every 6 (six) hours as needed for nausea or vomiting., Disp: 12 each, Rfl: 0   dicyclomine (BENTYL) 10 MG capsule, Take 1 capsule (10 mg total) by mouth 4 (four) times daily -  before meals and at bedtime., Disp: 40 capsule, Rfl: 3   Eslicarbazepine Acetate (APTIOM) 600 MG TABS, Take 600 mg by mouth at bedtime., Disp: 30 tablet, Rfl: 11   fluticasone (FLONASE) 50 MCG/ACT nasal spray, Place 2 sprays into both nostrils daily., Disp: 16 g, Rfl: 2   levocetirizine (XYZAL) 5 MG tablet, Take 1 tablet (5 mg total) by mouth every evening., Disp: 30 tablet, Rfl: 2   pantoprazole (PROTONIX) 40 MG tablet, Take 1 tablet (40 mg total) by mouth daily., Disp: 90 tablet, Rfl: 3   Medications ordered in this encounter:  Meds ordered this encounter  Medications   promethazine (PHENERGAN) 25 MG suppository    Sig: Place 1 suppository (25 mg total) rectally every 6 (six) hours as needed for nausea or vomiting.    Dispense:  12 each    Refill:  0    Order Specific Question:   Supervising Provider    Answer:   Merrilee Jansky [id number]     *If you need refills on other medications prior to your next appointment, please contact your  pharmacy*  Follow-Up: Call back or seek an in-person evaluation if the symptoms worsen or if the condition fails to improve as anticipated.   Virtual Care [phone]  Other Instructions  Vomiting, Adult Vomiting is when stomach contents forcefully come out of the mouth. Many people notice nausea before vomiting. Vomiting can make you feel weak and cause you to become dehydrated. Dehydration can make you feel tired and thirsty, cause you to have a dry mouth, and decrease how often you urinate. Older adults and people who have other diseases or a weak body defense system (immune system) are at higher risk for dehydration. It is important to treat vomiting as told by your health care provider. Follow these instructions at home:  Watch your symptoms for any changes. Tell your health care provider about them. Eating and drinking     Follow these recommendations as told by your health care provider: Take an oral rehydration solution (ORS). This is a drink that is sold at pharmacies and retail stores. Eat bland, easy-to-digest foods in small amounts as you are able. These foods include bananas, applesauce, rice, lean meats, toast, and crackers. Drink clear fluids slowly and in small amounts as you are able. Clear fluids include water, ice chips, low-calorie sports drinks, and fruit juice that has water added (diluted fruit juice). Avoid drinking fluids that contain a lot of sugar or caffeine, such as energy drinks, sports drinks, and soda. Avoid  alcohol. Avoid spicy or fatty foods.  General instructions Wash your hands often using soap and water for at least 20 seconds. If soap and water are not available, use hand sanitizer. Make sure that everyone in your household washes their hands frequently. Take over-the-counter and prescription medicines only as told by your health care provider. Rest at home while you recover. Watch your condition for any changes. Keep all follow-up  visits. This is important. Contact a health care provider if: Your vomiting gets worse. You have new symptoms. You have a fever. You cannot drink fluids without vomiting. You feel light-headed or dizzy. You have a headache. You have muscle cramps. You have a rash. You have pain while urinating. Get help right away if: You have pain in your chest, neck, arm, or jaw. Your heart is beating very quickly. You have trouble breathing or you are breathing very quickly. You feel extremely weak or you faint. Your skin feels cold and clammy. You feel confused. You have persistent vomiting. You have vomit that is bright red or looks like black coffee grounds. You have stools (feces) that are bloody or black, or stools that look like tar. You have a severe headache, a stiff neck, or both. You have severe pain, cramping, or bloating in your abdomen. You have signs of dehydration, such as: Dark urine, very little urine, or no urine. Cracked lips. Dry mouth. Sunken eyes. Sleepiness. Weakness. These symptoms may be an emergency. Get help right away. Call 911. Do not wait to see if the symptoms will go away. Do not drive yourself to the hospital. Summary Vomiting is when stomach contents forcefully come out of the mouth. Vomiting can cause you to become dehydrated. It is important to treat vomiting as told by your health care provider. Follow your health care provider's instructions about eating and drinking. Wash your hands often using soap and water for at least 20 seconds. If soap and water are not available, use hand sanitizer. Watch your condition for any changes and for signs of dehydration. Keep all follow-up visits. This is important. This information is not intended to replace advice given to you by your health care provider. Make sure you discuss any questions you have with your health care provider. Document Revised: 03/10/2021 Document Reviewed: 03/10/2021 Elsevier Patient  Education  [place].    If you have been instructed to have an in-person evaluation today at a local Urgent Care facility, please use the link below. It will take you to a list of all of our available [facility], including address, phone number and hours of operation. Please do not delay care.  [facility]  If you or a family member do not have a primary care provider, use the link below to schedule a visit and establish care. When you choose a [facility] primary care physician or advanced practice provider, you gain a long-term partner in health. Find a Primary Care Provider  Learn more about [facility]'s in-office and virtual care options: [facility]

## 2022-07-25 NOTE — ED Triage Notes (Signed)
Patient reports hematemesis with diarrhea and pain across abdomen / low back pain onset this morning . Denies fever or chills.

## 2022-07-26 ENCOUNTER — Telehealth: Payer: Self-pay

## 2022-07-26 ENCOUNTER — Encounter: Payer: Self-pay | Admitting: Internal Medicine

## 2022-07-26 ENCOUNTER — Encounter: Payer: Self-pay | Admitting: Physician Assistant

## 2022-07-26 NOTE — ED Notes (Signed)
X2 no response °

## 2022-07-26 NOTE — Telephone Encounter (Signed)
Refer to phone note 07/26/22.

## 2022-07-26 NOTE — Telephone Encounter (Signed)
Spoke with patient regarding MD recommendations. OV scheduled for 07/31/22 at 2:50 pm, pt is aware & verbalized all understanding.

## 2022-07-26 NOTE — Telephone Encounter (Signed)
I need him to come and see me 11/14 - can use a banding slot  Will consider other treatment at that time  He should gradually advance diet trying full liquids/soft foods tomorrow if still doing ok    He has a pending MRI brain from neuro not yet scheduled - important that he gets that done

## 2022-07-26 NOTE — Telephone Encounter (Signed)
Called patient in regards to [place] message that he sent in. He has complaints of intermittent nausea w/vomiting and generalized abdominal pain w/cramping (9/10). Yesterday he went to the ED after throwing up blood x2. He was advised to take phenergan suppositories PRN which seem to be helping, no blood today. He is able to hold down fluids. He's not currently taking the dicyclomine, states with not being able to hold down food it makes him more nauseous. Patient seeking further recommendations.

## 2022-07-27 ENCOUNTER — Telehealth: Payer: Self-pay

## 2022-07-27 NOTE — Telephone Encounter (Signed)
Transition Care Management Unsuccessful Follow-up Telephone Call  Date of discharge and from where:  07/26/22 from [facility]  Attempts:  1st Attempt  Reason for unsuccessful TCM follow-up call:  Unable to leave message

## 2022-07-28 ENCOUNTER — Emergency Department (HOSPITAL_COMMUNITY)
Admission: EM | Admit: 2022-07-28 | Discharge: 2022-07-29 | Disposition: A | Discharge status: Home / Self Care | Payer: Managed Care, Other (non HMO) | Attending: Student | Admitting: Student

## 2022-07-28 ENCOUNTER — Emergency Department (HOSPITAL_COMMUNITY): Payer: Managed Care, Other (non HMO)

## 2022-07-28 ENCOUNTER — Encounter (HOSPITAL_COMMUNITY): Payer: Self-pay

## 2022-07-28 ENCOUNTER — Other Ambulatory Visit: Payer: Self-pay

## 2022-07-28 DIAGNOSIS — Z20822 Contact with and (suspected) exposure to covid-19: Secondary | ICD-10-CM | POA: Diagnosis not present

## 2022-07-28 DIAGNOSIS — D72829 Elevated white blood cell count, unspecified: Secondary | ICD-10-CM | POA: Diagnosis not present

## 2022-07-28 DIAGNOSIS — J45909 Unspecified asthma, uncomplicated: Secondary | ICD-10-CM | POA: Diagnosis not present

## 2022-07-28 DIAGNOSIS — R112 Nausea with vomiting, unspecified: Secondary | ICD-10-CM | POA: Diagnosis present

## 2022-07-28 DIAGNOSIS — R1032 Left lower quadrant pain: Secondary | ICD-10-CM | POA: Insufficient documentation

## 2022-07-28 DIAGNOSIS — E876 Hypokalemia: Secondary | ICD-10-CM | POA: Insufficient documentation

## 2022-07-28 DIAGNOSIS — R1115 Cyclical vomiting syndrome unrelated to migraine: Secondary | ICD-10-CM

## 2022-07-28 LAB — CBC WITH DIFFERENTIAL/PLATELET
Abs Immature Granulocytes: 0.18 10*3/uL — ABNORMAL HIGH (ref 0.00–0.07)
Basophils Absolute: 0.1 10*3/uL (ref 0.0–0.1)
Basophils Relative: 0 %
Eosinophils Absolute: 0 10*3/uL (ref 0.0–0.5)
Eosinophils Relative: 0 %
HCT: 50.5 % (ref 39.0–52.0)
Hemoglobin: 17.4 g/dL — ABNORMAL HIGH (ref 13.0–17.0)
Immature Granulocytes: 1 %
Lymphocytes Relative: 8 %
Lymphs Abs: 1.8 10*3/uL (ref 0.7–4.0)
MCH: 29.4 pg (ref 26.0–34.0)
MCHC: 34.5 g/dL (ref 30.0–36.0)
MCV: 85.4 fL (ref 80.0–100.0)
Monocytes Absolute: 1 10*3/uL (ref 0.1–1.0)
Monocytes Relative: 5 %
Neutro Abs: 19 10*3/uL — ABNORMAL HIGH (ref 1.7–7.7)
Neutrophils Relative %: 86 %
Platelets: 322 10*3/uL (ref 150–400)
RBC: 5.91 MIL/uL — ABNORMAL HIGH (ref 4.22–5.81)
RDW: 12 % (ref 11.5–15.5)
WBC: 22 10*3/uL — ABNORMAL HIGH (ref 4.0–10.5)
nRBC: 0 % (ref 0.0–0.2)

## 2022-07-28 LAB — COMPREHENSIVE METABOLIC PANEL
ALT: 67 U/L — ABNORMAL HIGH (ref 0–44)
AST: 39 U/L (ref 15–41)
Albumin: 5 g/dL (ref 3.5–5.0)
Alkaline Phosphatase: 62 U/L (ref 38–126)
Anion gap: 15 (ref 5–15)
BUN: 21 mg/dL — ABNORMAL HIGH (ref 6–20)
CO2: 27 mmol/L (ref 22–32)
Calcium: 9.6 mg/dL (ref 8.9–10.3)
Chloride: 96 mmol/L — ABNORMAL LOW (ref 98–111)
Creatinine, Ser: 1.2 mg/dL (ref 0.61–1.24)
GFR, Estimated: >60 mL/min (ref >60)
Glucose, Bld: 114 mg/dL — ABNORMAL HIGH (ref 70–99)
Potassium: 3 mmol/L — ABNORMAL LOW (ref 3.5–5.1)
Sodium: 138 mmol/L (ref 135–145)
Total Bilirubin: 2 mg/dL — ABNORMAL HIGH (ref 0.3–1.2)
Total Protein: 9 g/dL — ABNORMAL HIGH (ref 6.5–8.1)

## 2022-07-28 LAB — RESP PANEL BY RT-PCR (FLU A&B, COVID) ARPGX2
Influenza A by PCR: NEGATIVE
Influenza B by PCR: NEGATIVE
SARS Coronavirus 2 by RT PCR: NEGATIVE

## 2022-07-28 LAB — LIPASE, BLOOD: Lipase: 39 U/L (ref 11–51)

## 2022-07-28 MED ORDER — IOHEXOL 300 MG/ML  SOLN
100.0000 mL | Freq: Once | INTRAMUSCULAR | Status: AC | PRN
  Administered 2022-07-28: 100 mL via INTRAVENOUS

## 2022-07-28 MED ORDER — METOCLOPRAMIDE HCL 5 MG/ML IJ SOLN
10.0000 mg | INTRAMUSCULAR | Status: AC
  Administered 2022-07-29: 10 mg via INTRAVENOUS
  Filled 2022-07-28: qty 2

## 2022-07-28 MED ORDER — PANTOPRAZOLE SODIUM 40 MG IV SOLR
40.0000 mg | Freq: Once | INTRAVENOUS | Status: AC
  Administered 2022-07-29: 40 mg via INTRAVENOUS
  Filled 2022-07-28: qty 10

## 2022-07-28 MED ORDER — SODIUM CHLORIDE 0.9 % IV BOLUS: 1000.0000 mL | Freq: Once | INTRAVENOUS | Status: DC

## 2022-07-28 MED ORDER — LACTATED RINGERS IV BOLUS
2000.0000 mL | Freq: Once | INTRAVENOUS | Status: AC
  Administered 2022-07-29: 2000 mL via INTRAVENOUS

## 2022-07-28 MED ORDER — POTASSIUM CHLORIDE 10 MEQ/100ML IV SOLN
10.0000 meq | INTRAVENOUS | Status: AC
  Administered 2022-07-29 (×3): 10 meq via INTRAVENOUS
  Filled 2022-07-28 (×3): qty 100

## 2022-07-28 MED ORDER — DIAZEPAM 5 MG/ML IJ SOLN: 2.5000 mg | Freq: Once | INTRAMUSCULAR | Status: DC

## 2022-07-28 MED ORDER — FAMOTIDINE IN NACL 20-0.9 MG/50ML-% IV SOLN
20.0000 mg | INTRAVENOUS | Status: AC
  Administered 2022-07-29: 20 mg via INTRAVENOUS
  Filled 2022-07-28: qty 50

## 2022-07-28 MED ORDER — DROPERIDOL 2.5 MG/ML IJ SOLN: 1.2500 mg | Freq: Once | INTRAMUSCULAR | Status: DC

## 2022-07-28 MED ORDER — KETOROLAC TROMETHAMINE 15 MG/ML IJ SOLN
15.0000 mg | Freq: Once | INTRAMUSCULAR | Status: AC
  Administered 2022-07-29: 15 mg via INTRAVENOUS
  Filled 2022-07-28: qty 1

## 2022-07-28 NOTE — ED Notes (Signed)
Sent an additional dark green to lab. Patient stated he could not urinate right now.

## 2022-07-28 NOTE — ED Provider Triage Note (Signed)
Emergency Medicine Provider Triage Evaluation Note  [name] , a 26 y.o. male  was evaluated in triage.  Pt complains of vomiting profusely for the last 4 days. Reports SO and daughter are sick. Has been dealing with profuse vomiting for the last 6 months. Denies blood in vomitus. C/o chest pain and abdominal pain, diffuse. Worse after retching.  Review of Systems  Positive: Vomiting, nausea Negative: SOB  Physical Exam  Ht 6' (1.829 m)   Wt 88.5 kg   SpO2 99%   BMI 26.45 kg/m  Gen:   Awake, +profusely vomiting Resp:  Normal effort  MSK:   Moves extremities without difficulty   Medical Decision Making  Medically screening exam initiated at 8:15 PM.  Appropriate orders placed.  [name] was informed that the remainder of the evaluation will be completed by another provider, this initial triage assessment does not replace that evaluation, and the importance of remaining in the ED until their evaluation is complete.    Pete Pelt, [place] 07/28/22 2022

## 2022-07-28 NOTE — ED Triage Notes (Signed)
Complaining of nausea and vomiting for the past 4 days, worse now. A&O x 4 has been to GI doctor and has an appointment tomorrow. Arrived EMS from home.

## 2022-07-28 NOTE — ED Provider Notes (Signed)
[facility]-EMERGENCY DEPT Provider Note   CSN: [id number] Arrival date & time: 07/28/22  1957     History {Add pertinent medical, surgical, social history, OB history to HPI:1} Chief Complaint  Patient presents with   Emesis    Andrew Castillo is a 26 y.o. male.  26 year old male presents to the emergency department for evaluation of nausea and vomiting.  Symptoms began 4 days ago and were waxing and waning in severity.  They were significantly worsened this morning and he has had too numerous to count episodes of emesis since waking today.  Was previously using Phenergan suppositories with moderate improvement, but used his last suppository yesterday.  Complains of associated abdominal pain which is generalized; "all over".  He is scheduled to see his gastroenterologist on Tuesday.  No history of abdominal surgeries.  The history is provided by the patient. No language interpreter was used.  Emesis      Home Medications Prior to Admission medications   Medication Sig Start Date End Date Taking? Authorizing Provider  dicyclomine (BENTYL) 10 MG capsule Take 1 capsule (10 mg total) by mouth 4 (four) times daily -  before meals and at bedtime. Patient taking differently: Take 10 mg by mouth daily as needed for spasms. 04/05/22   Meredith Pel, NP  fluticasone (FLONASE) 50 MCG/ACT nasal spray Place 2 sprays into both nostrils daily. 05/24/22   [name], [name], NP-C  levocetirizine (XYZAL) 5 MG tablet Take 1 tablet (5 mg total) by mouth every evening. 05/24/22   [name], [name], NP-C  pantoprazole (PROTONIX) 40 MG tablet Take 1 tablet (40 mg total) by mouth daily. Patient not taking: Reported on 07/25/2022 05/23/22 06/22/22  Iva Boop, MD  promethazine (PHENERGAN) 25 MG suppository Place 1 suppository (25 mg total) rectally every 6 (six) hours as needed for nausea or vomiting. 07/25/22   Margaretann Loveless, PA-C      Allergies    Penicillins and Flexeril  [cyclobenzaprine]    Review of Systems   Review of Systems  Gastrointestinal:  Positive for vomiting.  Ten systems reviewed and are negative for acute change, except as noted in the HPI.    Physical Exam Updated Vital Signs BP (!) 158/95 (BP Location: Left Arm)   Pulse 88   Temp 98.2 F (36.8 C) (Oral)   Resp 18   Ht 6' (1.829 m)   Wt 88.5 kg   SpO2 100%   BMI 26.45 kg/m   Physical Exam Vitals and nursing note reviewed.  Constitutional:      General: He is not in acute distress.    Appearance: He is well-developed. He is not diaphoretic.     Comments: Nontoxic appearing and in NAD  HENT:     Head: Normocephalic and atraumatic.  Eyes:     General: No scleral icterus.    Conjunctiva/sclera: Conjunctivae normal.  Cardiovascular:     Rate and Rhythm: Normal rate and regular rhythm.     Pulses: Normal pulses.  Pulmonary:     Effort: Pulmonary effort is normal. No respiratory distress.     Breath sounds: No stridor. No wheezing.     Comments: Respirations even and unlabored Abdominal:     Palpations: Abdomen is soft.     Tenderness: There is abdominal tenderness.     Comments: Abdomen soft. Decreased bowel sounds. Generalized TTP, worse in the LLQ without guarding. No peritoneal signs.  Musculoskeletal:        General: Normal range  of motion.     Cervical back: Normal range of motion.  Skin:    General: Skin is warm and dry.     Coloration: Skin is not pale.     Findings: No erythema or rash.  Neurological:     Mental Status: He is alert and oriented to person, place, and time.     Coordination: Coordination normal.  Psychiatric:        Behavior: Behavior normal.     ED Results / Procedures / Treatments   Labs (all labs ordered are listed, but only abnormal results are displayed) Labs Reviewed  COMPREHENSIVE METABOLIC PANEL - Abnormal; Notable for the following components:      Result Value   Potassium 3.0 (*)    Chloride 96 (*)    Glucose, Bld 114 (*)     BUN 21 (*)    Total Protein 9.0 (*)    ALT 67 (*)    Total Bilirubin 2.0 (*)    All other components within normal limits  CBC WITH DIFFERENTIAL/PLATELET - Abnormal; Notable for the following components:   WBC 22.0 (*)    RBC 5.91 (*)    Hemoglobin 17.4 (*)    Neutro Abs 19.0 (*)    Abs Immature Granulocytes 0.18 (*)    All other components within normal limits  RESP PANEL BY RT-PCR (FLU A&B, COVID) ARPGX2  LIPASE, BLOOD  URINALYSIS, ROUTINE W REFLEX MICROSCOPIC  RAPID URINE DRUG SCREEN, HOSP PERFORMED  MAGNESIUM    EKG None  Radiology DG Chest 1 View  Result Date: 07/28/2022 CLINICAL DATA:  Pt complains of vomiting profusely for the last 4 days. Reports SO and daughter are sick. Has been dealing with profuse vomiting for the last 6 months. Denies blood in vomitus. C/o chest pain and abdominal pain, diffuse. Worse after retching. EXAM: CHEST  1 VIEW COMPARISON:  10/14/2019 FINDINGS: Lungs are clear. Heart size and mediastinal contours are within normal limits. No effusion.  No pneumothorax. Visualized bones unremarkable. IMPRESSION: No acute cardiopulmonary disease. Electronically Signed   By: Corlis Leak M.D.   On: 07/28/2022 20:40    Procedures Procedures  {Document cardiac monitor, telemetry assessment procedure when appropriate:1}  Medications Ordered in ED Medications  sodium chloride 0.9 % bolus 1,000 mL (1,000 mLs Intravenous Not Given 07/28/22 2340)  pantoprazole (PROTONIX) injection 40 mg (has no administration in time range)  lactated ringers bolus 2,000 mL (has no administration in time range)  potassium chloride 10 mEq in 100 mL IVPB (has no administration in time range)  metoCLOPramide (REGLAN) injection 10 mg (has no administration in time range)  ketorolac (TORADOL) 15 MG/ML injection 15 mg (has no administration in time range)  famotidine (PEPCID) IVPB 20 mg premix (has no administration in time range)    ED Course/ Medical Decision Making/ A&P                            Medical Decision Making Amount and/or Complexity of Data Reviewed Labs: ordered. Radiology: ordered.  Risk Prescription drug management.   This patient presents to the ED for concern of nausea/vomiting, this involves an extensive number of treatment options, and is a complaint that carries with it a high risk of complications and morbidity.  The differential diagnosis includes gastroenteritis vs pSBO/SBO vs pancreatitis vs biliary colic vs cannabinoid hyperemesis vs gastroparesis.   Co morbidities that complicate the patient evaluation  GERD Asthma Possible seizure d/o   Additional  history obtained:  Additional history obtained from outpatient GI visit on 05/23/22 External records from outside source obtained and reviewed including gastric emptying study from 04/2022 which was normal.   Lab Tests:  I Ordered, and personally interpreted labs.  The pertinent results include:  nonspecific leukocytosis of 22 (leukocytosis at baseline that varies per encounter), Hgb of 17.4 (likely hemoconcentration), hypokalemia of 3.0 (acute), Tbili of 2.0   Imaging Studies ordered:  I ordered imaging studies including CXR and CT abdomen/pelvis  I independently visualized and interpreted imaging which showed *** I agree with the radiologist interpretation   Cardiac Monitoring:  The patient was maintained on a cardiac monitor.  I personally viewed and interpreted the cardiac monitored which showed an underlying rhythm of: NSR   Medicines ordered and prescription drug management:  I ordered medication including IVF for dehydration, IV Reglan, Pepcid and Protonix for nausea, IV Toradol for pain Reevaluation of the patient after these medicines showed that the patient {resolved/improved/worsened:23923::"improved"} I have reviewed the patients home medicines and have made adjustments as needed   Test Considered:  ***   Critical Interventions:  ***   Consultations  Obtained:  I requested consultation with the ***,  and discussed lab and imaging findings as well as pertinent plan - they recommend: ***   Problem List / ED Course:  ***   Reevaluation:  After the interventions noted above, I reevaluated the patient and found that they have :{resolved/improved/worsened:23923::"improved"}   Social Determinants of Health:  Insured patient   Dispostion:  After consideration of the diagnostic results and the patients response to treatment, I feel that the patent would benefit from ***.    {Document critical care time when appropriate:1} {Document review of labs and clinical decision tools ie heart score, Chads2Vasc2 etc:1}  {Document your independent review of radiology images, and any outside records:1} {Document your discussion with family members, caretakers, and with consultants:1} {Document social determinants of health affecting pt's care:1} {Document your decision making why or why not admission, treatments were needed:1} Final Clinical Impression(s) / ED Diagnoses Final diagnoses:  None    Rx / DC Orders ED Discharge Orders     None

## 2022-07-29 LAB — URINALYSIS, ROUTINE W REFLEX MICROSCOPIC
Bilirubin Urine: NEGATIVE
Glucose, UA: NEGATIVE mg/dL
Hgb urine dipstick: NEGATIVE
Ketones, ur: 20 mg/dL — AB
Leukocytes,Ua: NEGATIVE
Nitrite: NEGATIVE
Protein, ur: NEGATIVE mg/dL
Specific Gravity, Urine: 1.005 (ref 1.005–1.030)
pH: 7 (ref 5.0–8.0)

## 2022-07-29 LAB — RAPID URINE DRUG SCREEN, HOSP PERFORMED
Amphetamines: NOT DETECTED
Barbiturates: NOT DETECTED
Benzodiazepines: NOT DETECTED
Cocaine: POSITIVE — AB
Opiates: NOT DETECTED
Tetrahydrocannabinol: POSITIVE — AB

## 2022-07-29 LAB — MAGNESIUM: Magnesium: 2.3 mg/dL (ref 1.7–2.4)

## 2022-07-29 MED ORDER — PROMETHAZINE HCL 25 MG RE SUPP: 25.0000 mg | Freq: Four times a day (QID) | RECTAL | 0 refills | Status: DC | PRN

## 2022-07-29 MED ORDER — LORAZEPAM 2 MG/ML IJ SOLN
0.5000 mg | Freq: Once | INTRAMUSCULAR | Status: AC
  Administered 2022-07-29: 0.5 mg via INTRAVENOUS
  Filled 2022-07-29: qty 1

## 2022-07-29 NOTE — Discharge Instructions (Addendum)
Take your Protonix daily as prescribed.  You may use Phenergan suppositories for management of nausea.  Follow up with your gastroenterologist.

## 2022-07-29 NOTE — ED Notes (Signed)
Pt tolerated PO challenge

## 2022-07-30 ENCOUNTER — Telehealth: Payer: Self-pay

## 2022-07-30 NOTE — Telephone Encounter (Signed)
Transition Care Management Unsuccessful Follow-up Telephone Call  Date of discharge and from where:  07/26/22 from [facility]  Attempts:  2nd Attempt  Reason for unsuccessful TCM follow-up call:  Unable to leave message

## 2022-07-30 NOTE — Telephone Encounter (Signed)
Transition Care Management Unsuccessful Follow-up Telephone Call  Date of discharge and from where:  07/29/2022 from [facility]  Attempts:  1st Attempt  Reason for unsuccessful TCM follow-up call:  Unable to leave message

## 2022-07-31 ENCOUNTER — Encounter: Payer: Self-pay | Admitting: Internal Medicine

## 2022-07-31 ENCOUNTER — Ambulatory Visit: Payer: Managed Care, Other (non HMO) | Admitting: Internal Medicine

## 2022-07-31 VITALS — BP 140/86 | HR 115 | Ht 72.0 in | Wt 194.2 lb

## 2022-07-31 DIAGNOSIS — R1115 Cyclical vomiting syndrome unrelated to migraine: Secondary | ICD-10-CM | POA: Diagnosis not present

## 2022-07-31 DIAGNOSIS — K920 Hematemesis: Secondary | ICD-10-CM | POA: Diagnosis not present

## 2022-07-31 NOTE — Telephone Encounter (Signed)
Transition Care Management Unsuccessful Follow-up Telephone Call  Date of discharge and from where:  07/29/22 from [facility]  Attempts:  2nd Attempt  Reason for unsuccessful TCM follow-up call:  Unable to leave message

## 2022-07-31 NOTE — Patient Instructions (Addendum)
If you are age 25 or older, your body mass index should be between 23-30. Your Body mass index is 26.35 kg/m. If this is out of the aforementioned range listed, please consider follow up with your Primary Care Provider.  If you are age 94 or younger, your body mass index should be between 19-25. Your Body mass index is 26.35 kg/m. If this is out of the aformentioned range listed, please consider follow up with your Primary Care Provider.   ________________________________________________________   Andrew Castillo have been scheduled for an endoscopy. Please follow written instructions given to you at your visit today. If you use inhalers (even only as needed), please bring them with you on the day of your procedure.    It was a pleasure to see you today!  Thank you for trusting me with your gastrointestinal care!

## 2022-07-31 NOTE — Progress Notes (Signed)
Andrew Castillo 26 y.o. Apr 21, 1996 [id number]  Assessment & Plan:   Encounter Diagnoses  Name Primary?   Hematemesis with nausea Yes   Persistent vomiting     ? Of cyclic vomiting syndrome though periods of time w/o sxs may not be long enough. Last EGD 2017 so appropriate to repeat given current problems.  Also needs to f/u on MRI  He denies intentional cocaine - ? Contaminated marijuana  Cannabis hyperemesis syndrome has been considered but per his hx weeks w/o marijuana did not stop problems in past  Last seizure in May    He will have his EGD on November 16 by Dr. Bryan Lemma as my schedule is full.  I anticipate finding esophagitis but want to exclude other abnormalities that would be causative for his current sign and symptom complex.  Gastric emptying study was normal.  He could have a brain tumor though I doubt that, he needs to follow through with MRI as ordered by neurology.  Depending upon the results of all this I think trying a tricyclic agent at bedtime i.e. amitriptyline is going to make sense, most likely.  In the meantime he should try to get his pantoprazole in twice a day if possible, and try to at least sip liquids frequently to hydrate and eat foods that he can tolerate but focus on liquid intake.  I advised him not to lie down if that triggers the nausea and vomiting more.  An additional consideration given the positional worsening would be some type of inner ear abnormality perhaps.  I do not get a sense that he is having vertigo but I did not specifically ask.  We can revisit that when he returns.  Could consider empiric trial of meclizine.  Another option would be short-term use of metoclopramide which has antiemetic properties, even though he does not have an abnormal gastric emptying study.    CC: Girtha Rm, NP-C  Subjective:   Chief Complaint: vomiting, vomiting blood  HPI 26 year old African-American man with a history of chronic  persistent nausea and vomiting that I had thought with cyclic vomiting syndrome, and he is having a severe flare where he is having nausea and vomiting for several days, he has vomited some red blood on at least 1 occasion and then coffee grounds.  He shows me pictures.  He is having a difficult time keeping food down he has been able to keep yogurt down.  If he lies down he will have more symptoms with the nausea and vomiting.  He has a frontal headache that feels like a sinus headache.  He continues to use marijuana 3 bowls a day, in the past he has tried stopping this for weeks at a time but has not made a difference with his vomiting.  He had a normal gastric emptying study recently.  His urine tox screen at the ER showed cocaine as well as THC though he denies intentional use of cocaine for almost a year.  CT scan in the emergency department on November 11-12 was unrevealing.  Only abnormality reported with some shoddy stable ileocolonic adenopathy.  I have reviewed that report.  He was there and was treated with IV fluids and IV antiemetics and was improved and tolerating liquids and was discharged but has struggled since that time.  He has been using promethazine suppositories intermittently with some help ondansetron stopped helping previously.   He has a seizure disorder and was established with neurology recently, EEG was abnormal there is  an MRI of the brain pending.  He has not had a seizure since May. Denies double vision, focal neurologic problems.  EGD in September 2017 showed grade B reflux esophagitis.  He is unable to keep his pantoprazole down at this time.   Allergies  Allergen Reactions   Penicillins Hives    Did it involve swelling of the face/tongue/throat, SOB, or low BP? Unknown Did it involve sudden or severe rash/hives, skin peeling, or any reaction on the inside of your mouth or nose? Unknown Did you need to seek medical attention at a hospital or doctor's office?  Unknown When did it last happen?   Pt doesn't remember he said he was a child    If all above answers are "NO", may proceed with cephalosporin use.    Flexeril [Cyclobenzaprine]     seizures   Current Meds  Medication Sig   dicyclomine (BENTYL) 10 MG capsule Take 1 capsule (10 mg total) by mouth 4 (four) times daily -  before meals and at bedtime. (Patient taking differently: Take 10 mg by mouth daily as needed for spasms.)   Eslicarbazepine Acetate (APTIOM) 200 MG TABS Take 1 tablet by mouth daily.   fluticasone (FLONASE) 50 MCG/ACT nasal spray Place 2 sprays into both nostrils daily.   levocetirizine (XYZAL) 5 MG tablet Take 1 tablet (5 mg total) by mouth every evening.   promethazine (PHENERGAN) 25 MG suppository Place 1 suppository (25 mg total) rectally every 6 (six) hours as needed for nausea or vomiting.   Past Medical History:  Diagnosis Date   Childhood asthma    Environmental allergies    GERD (gastroesophageal reflux disease)    Headache    migraines   Seasonal allergic conjunctivitis    Seizures (HCC)    Past Surgical History:  Procedure Laterality Date   ESOPHAGOGASTRODUODENOSCOPY     Left Knee Menuiscus     "several surguries on same knee"   NM ESOPHAGEAL REFLUX     Right Shoulder Surgery     TONSILLECTOMY     adenoids   Social History   Social History Narrative   Lives with aunt   No caffeine   family history includes ADD / ADHD in his brother; Anxiety disorder in his maternal aunt, mother, and sister; Bone cancer in his paternal grandmother; Clotting disorder in his mother; Crohn's disease in his mother; Depression in his maternal aunt; Diabetes in his maternal grandmother, paternal aunt, and paternal grandfather; Drug abuse in his paternal uncle; Fibromyalgia in his mother and paternal aunt; Heart disease in his paternal grandfather; Hyperlipidemia in his father; Hypertension in his father and maternal grandmother; Liver disease in his father and maternal  grandmother; Lupus in his maternal aunt, mother, and paternal aunt; Migraines in an other family member; Seizures in his brother; Thyroid disease in his mother.   Review of Systems As per HPI  Objective:   Physical Exam @BP  (!) 140/86   Pulse (!) 115   Ht 6' (1.829 m)   Wt 194 lb 4 oz (88.1 kg)   BMI 26.35 kg/m @  General:  NAD Eyes:   anicteric Lungs:  clear Heart::  S1S2 no rubs, murmurs or gallops Abdomen:  soft and nontender, BS+ Ext:   no edema, cyanosis or clubbing    Data Reviewed:  See HPI

## 2022-08-01 ENCOUNTER — Telehealth: Payer: Self-pay | Admitting: Internal Medicine

## 2022-08-01 MED ORDER — PROCHLORPERAZINE 25 MG RE SUPP: 25.0000 mg | Freq: Two times a day (BID) | RECTAL | 0 refills | Status: DC | PRN

## 2022-08-01 NOTE — Telephone Encounter (Signed)
Inbound call from patient requesting a call back to discuss medications. Patient stated that he came in yesterday for a visit and thought he was going to be put on a nausea medication but has not heard anything from the pharmacy. Please advise.

## 2022-08-01 NOTE — Telephone Encounter (Signed)
Called and spoke to patient. Let him know recommendation from Dr. Leone Payor. He would like suppositories sent to [facility] on [address]. Script sent

## 2022-08-01 NOTE — Telephone Encounter (Signed)
I was going to wait til he had his EGD which is why I did not prescribe anything different - however I will send in compazine (prochlorperazine) suppositories now  He should take them every 12 hrs and not as needed until tomorrow when he gets scoped  I pended Rx so we can confirm pharmacy

## 2022-08-01 NOTE — Telephone Encounter (Signed)
Patient had appointment with his GI doctor, Dr.Gessner yesterday, 07/31/2022 and this was addressed.

## 2022-08-02 ENCOUNTER — Encounter (HOSPITAL_COMMUNITY): Admission: RE | Payer: Self-pay | Source: Home / Self Care

## 2022-08-02 ENCOUNTER — Ambulatory Visit: Payer: Managed Care, Other (non HMO) | Admitting: Gastroenterology

## 2022-08-02 ENCOUNTER — Ambulatory Visit (HOSPITAL_COMMUNITY)
Admission: RE | Admit: 2022-08-02 | Payer: Managed Care, Other (non HMO) | Source: Home / Self Care | Admitting: Gastroenterology

## 2022-08-02 ENCOUNTER — Encounter: Payer: Self-pay | Admitting: Gastroenterology

## 2022-08-02 VITALS — BP 122/66 | HR 82 | Temp 98.2°F | Resp 16 | Ht 72.0 in | Wt 194.0 lb

## 2022-08-02 DIAGNOSIS — R1115 Cyclical vomiting syndrome unrelated to migraine: Secondary | ICD-10-CM

## 2022-08-02 DIAGNOSIS — K209 Esophagitis, unspecified without bleeding: Secondary | ICD-10-CM | POA: Diagnosis not present

## 2022-08-02 DIAGNOSIS — K920 Hematemesis: Secondary | ICD-10-CM

## 2022-08-02 DIAGNOSIS — K449 Diaphragmatic hernia without obstruction or gangrene: Secondary | ICD-10-CM | POA: Diagnosis not present

## 2022-08-02 DIAGNOSIS — K2990 Gastroduodenitis, unspecified, without bleeding: Secondary | ICD-10-CM

## 2022-08-02 DIAGNOSIS — K2970 Gastritis, unspecified, without bleeding: Secondary | ICD-10-CM | POA: Diagnosis not present

## 2022-08-02 DIAGNOSIS — K319 Disease of stomach and duodenum, unspecified: Secondary | ICD-10-CM | POA: Diagnosis not present

## 2022-08-02 SURGERY — ESOPHAGOGASTRODUODENOSCOPY (EGD) WITH PROPOFOL
Anesthesia: Monitor Anesthesia Care

## 2022-08-02 MED ORDER — SODIUM CHLORIDE 0.9 % IV SOLN: 500.0000 mL | INTRAVENOUS | Status: DC

## 2022-08-02 MED ORDER — SUCRALFATE 1 GM/10ML PO SUSP: 1.0000 g | Freq: Three times a day (TID) | ORAL | 0 refills | Status: DC

## 2022-08-02 MED ORDER — PROMETHAZINE HCL 25 MG RE SUPP: 25.0000 mg | Freq: Four times a day (QID) | RECTAL | 1 refills | Status: DC | PRN

## 2022-08-02 MED ORDER — PANTOPRAZOLE SODIUM 40 MG PO TBEC: 40.0000 mg | DELAYED_RELEASE_TABLET | Freq: Two times a day (BID) | ORAL | 3 refills | Status: DC

## 2022-08-02 MED ORDER — PANTOPRAZOLE SODIUM 40 MG PO TBEC: 40.0000 mg | DELAYED_RELEASE_TABLET | Freq: Two times a day (BID) | ORAL | 0 refills | Status: DC

## 2022-08-02 NOTE — Progress Notes (Signed)
To pacu, VSS. Report to Rn.tb 

## 2022-08-02 NOTE — Op Note (Signed)
[facility] Patient Name: Andrew Castillo Procedure Date: 08/02/2022 8:41 AM MRN: [id number] Endoscopist: Doristine Locks , MD, [id number] Age: 26 Referring MD:  Date of Birth: Jan 30, 1996 Gender: Male Account #: [id number] Procedure:                Upper GI endoscopy Indications:              Coffee-ground emesis, Hematemesis, Nausea with                            vomiting                           26 yo male with persistent nausea/vomiting and                            episodic hematemesis and coffee ground emesis.                            History of LA Grade B esophagitis on EGD in 2017.                            GES normal. Was seen by Dr. Leone Payor earlier this                            week and scheduled for expedited EGD today for                            diagnostic and potentially therapeutic intent. Medicines:                Monitored Anesthesia Care Procedure:                Pre-Anesthesia Assessment:                           - Prior to the procedure, a History and Physical                            was performed, and patient medications and                            allergies were reviewed. The patient's tolerance of                            previous anesthesia was also reviewed. The risks                            and benefits of the procedure and the sedation                            options and risks were discussed with the patient.                            All questions were answered, and informed consent  was obtained. Prior Anticoagulants: The patient has                            taken no anticoagulant or antiplatelet agents. ASA                            Grade Assessment: III - A patient with severe                            systemic disease. After reviewing the risks and                            benefits, the patient was deemed in satisfactory                            condition to undergo the procedure.                            After obtaining informed consent, the endoscope was                            passed under direct vision. Throughout the                            procedure, the patient's blood pressure, pulse, and                            oxygen saturations were monitored continuously. The                            GIF [id number] #[id number] was introduced through the                            mouth, and advanced to the third part of duodenum.                            The upper GI endoscopy was accomplished without                            difficulty. The patient tolerated the procedure                            well. Scope In: Scope Out: Findings:                 LA Grade C (one or more mucosal breaks continuous                            between tops of 2 or more mucosal folds, less than                            75% circumference) esophagitis with no bleeding was  found 35 to 40 cm from the incisors.                           A 3 cm hiatal hernia was present.                           The gastroesophageal flap valve was visualized                            endoscopically and classified as Hill Grade IV (no                            fold, wide open lumen, hiatal hernia present).                           Mild inflammation characterized by congestion                            (edema) and erythema was found at the incisura and                            in the gastric antrum. Biopsies were taken with a                            cold forceps for histology and Helicobacter pylori                            testing. Estimated blood loss was minimal.                           The gastric fundus and gastric body were normal.                            Additional mucosal biopsies were taken with a cold                            forceps for histology and Helicobacter pylori                            testing. Estimated blood loss was minimal.                            The examined duodenum was normal. Complications:            No immediate complications. Estimated Blood Loss:     Estimated blood loss was minimal. Impression:               - LA Grade C esophagitis with no bleeding.                           - 3 cm hiatal hernia.                           - Gastroesophageal flap valve classified as Hill  Grade IV (no fold, wide open lumen, hiatal hernia                            present).                           - Gastritis. Biopsied.                           - Normal gastric fundus and gastric body. Biopsied.                           - Normal examined duodenum. Recommendation:           - Patient has a contact number available for                            emergencies. The signs and symptoms of potential                            delayed complications were discussed with the                            patient. Return to normal activities tomorrow.                            Written discharge instructions were provided to the                            patient.                           - Resume previous diet.                           - Continue present medications.                           - Await pathology results.                           - Increase Protonix (pantoprazole) to 40 mg PO BID                            for 8 weeks, then reduc back to 40 mg daily.                           - Use sucralfate suspension 1 gram PO QID for 4                            weeks.                           - Repeat upper endoscopy in 6-8 weeks to check                            healing.  This can be scheduled with Dr. Leone Payor for                            continuity of care.                           - Follow-up with Dr. Leone Payor in the GI office in 3                            months, or sooner prn.                           - Proceed with MRI brain as already ordered. Doristine Locks, MD 08/02/2022 9:09:59 AM

## 2022-08-02 NOTE — Progress Notes (Signed)
Called to room to assist during endoscopic procedure.  Patient ID and intended procedure confirmed with present staff. Received instructions for my participation in the procedure from the performing physician.  

## 2022-08-02 NOTE — Progress Notes (Signed)
Pt sched for procedure, new Rx's all reviewed with pt, instruct anti nausea tips including no marijuana as this contributes to the n/v. Pt and cg verb understanding

## 2022-08-02 NOTE — Patient Instructions (Addendum)
- Resume previous diet. - Continue present medications.  - Increase Protonix (pantoprazole) to 40 mg PO BID for 8 weeks, then reduce back to 40 mg daily.  - Use sucralfate suspension 1 gram PO QID for 4 weeks.  - Repeat upper endoscopy in 6-8 weeks to check healing. This can be scheduled with Dr. Leone Payor for continuity of care.  - Follow-up with Dr. Leone Payor in the GI office in 3 months, or sooner prn.  - Proceed with MRI brain as already ordered.   YOU HAD AN ENDOSCOPIC PROCEDURE TODAY AT THE [facility]:   Refer to the procedure report that was given to you for any specific questions about what was found during the examination.  If the procedure report does not answer your questions, please call your gastroenterologist to clarify.  If you requested that your care partner not be given the details of your procedure findings, then the procedure report has been included in a sealed envelope for you to review at your convenience later.  YOU SHOULD EXPECT: Some feelings of bloating in the abdomen. Passage of more gas than usual.  Walking can help get rid of the air that was put into your GI tract during the procedure and reduce the bloating. If you had a lower endoscopy (such as a colonoscopy or flexible sigmoidoscopy) you may notice spotting of blood in your stool or on the toilet paper. If you underwent a bowel prep for your procedure, you may not have a normal bowel movement for a few days.  Please Note:  You might notice some irritation and congestion in your nose or some drainage.  This is from the oxygen used during your procedure.  There is no need for concern and it should clear up in a day or so.  SYMPTOMS TO REPORT IMMEDIATELY:  Following upper endoscopy (EGD)  Vomiting of blood or coffee ground material  New chest pain or pain under the shoulder blades  Painful or persistently difficult swallowing  New shortness of breath  Fever of 100F or higher  Black,  tarry-looking stools  For urgent or emergent issues, a gastroenterologist can be reached at any hour by calling [phone]. Do not use MyChart messaging for urgent concerns.    DIET:  We do recommend a small meal at first, but then you may proceed to your regular diet.  Drink plenty of fluids but you should avoid alcoholic beverages for 24 hours.  ACTIVITY:  You should plan to take it easy for the rest of today and you should NOT DRIVE or use heavy machinery until tomorrow (because of the sedation medicines used during the test).    FOLLOW UP: Our staff will call the number listed on your records the next business day following your procedure.  We will call around 7:15- 8:00 am to check on you and address any questions or concerns that you may have regarding the information given to you following your procedure. If we do not reach you, we will leave a message.     If any biopsies were taken you will be contacted by phone or by letter within the next 1-3 weeks.  Please call us at [phone] if you have not heard about the biopsies in 3 weeks.    SIGNATURES/CONFIDENTIALITY: You and/or your care partner have signed paperwork which will be entered into your electronic medical record.  These signatures attest to the fact that that the information above on your After Visit Summary has been reviewed  and is understood.  Full responsibility of the confidentiality of this discharge information lies with you and/or your care-partner. 

## 2022-08-02 NOTE — Progress Notes (Signed)
Vital signs checked by:CW ? ?The medical and surgical history was reviewed and verified with the patient. ? ?

## 2022-08-02 NOTE — Progress Notes (Signed)
Pt smoked THC this am 5:00, denies any other drug use.tb

## 2022-08-02 NOTE — Progress Notes (Signed)
GASTROENTEROLOGY PROCEDURE H&P NOTE   Primary Care Physician: Avanell Shackleton, NP-C    Reason for Procedure:   Nausea, vomiting, hematemesis  Plan:    EGD  Patient is appropriate for endoscopic procedure(s) in the ambulatory ([facility]) setting.  The nature of the procedure, as well as the risks, benefits, and alternatives were carefully and thoroughly reviewed with the patient. Ample time for discussion and questions allowed. The patient understood, was satisfied, and agreed to proceed.     HPI: Andrew Castillo is a 26 y.o. male who presents for EGD for evaluation of n/v, hematemesis/coffee ground emesis .  Patient was most recently seen in the Gastroenterology Clinic on 07/31/2022 by Dr. Leone Payor.  No interval change in medical history since that appointment. Please refer to that note for full details regarding GI history and clinical presentation.   Past Medical History:  Diagnosis Date   Childhood asthma    Environmental allergies    GERD (gastroesophageal reflux disease)    Headache    migraines   Seasonal allergic conjunctivitis    Seizures (HCC)    caused by flexiril 01/2022    Past Surgical History:  Procedure Laterality Date   ESOPHAGOGASTRODUODENOSCOPY     Left Knee Menuiscus     "several surguries on same knee"   NM ESOPHAGEAL REFLUX     Right Shoulder Surgery     TONSILLECTOMY     adenoids    Prior to Admission medications   Medication Sig Start Date End Date Taking? Authorizing Provider  dicyclomine (BENTYL) 10 MG capsule Take 1 capsule (10 mg total) by mouth 4 (four) times daily -  before meals and at bedtime. Patient taking differently: Take 10 mg by mouth daily as needed for spasms. 04/05/22  Yes Meredith Pel, NP  Eslicarbazepine Acetate (APTIOM) 200 MG TABS Take 1 tablet by mouth daily.   Yes [provider]  fluticasone (FLONASE) 50 MCG/ACT nasal spray Place 2 sprays into both nostrils daily. 05/24/22  Yes [name], [name], NP-C   levocetirizine (XYZAL) 5 MG tablet Take 1 tablet (5 mg total) by mouth every evening. 05/24/22  Yes [name], [name], NP-C  pantoprazole (PROTONIX) 40 MG tablet Take 1 tablet (40 mg total) by mouth daily. 05/23/22 08/02/22 Yes Iva Boop, MD  promethazine (PHENERGAN) 25 MG suppository Place 1 suppository (25 mg total) rectally every 6 (six) hours as needed for nausea or vomiting. 07/29/22  Yes Antony Madura, PA-C  prochlorperazine (COMPAZINE) 25 MG suppository Place 1 suppository (25 mg total) rectally every 12 (twelve) hours as needed for nausea or vomiting. Patient not taking: Reported on 08/02/2022 08/01/22   Iva Boop, MD    Current Outpatient Medications  Medication Sig Dispense Refill   dicyclomine (BENTYL) 10 MG capsule Take 1 capsule (10 mg total) by mouth 4 (four) times daily -  before meals and at bedtime. (Patient taking differently: Take 10 mg by mouth daily as needed for spasms.) 40 capsule 3   Eslicarbazepine Acetate (APTIOM) 200 MG TABS Take 1 tablet by mouth daily.     fluticasone (FLONASE) 50 MCG/ACT nasal spray Place 2 sprays into both nostrils daily. 16 g 2   levocetirizine (XYZAL) 5 MG tablet Take 1 tablet (5 mg total) by mouth every evening. 30 tablet 2   pantoprazole (PROTONIX) 40 MG tablet Take 1 tablet (40 mg total) by mouth daily. 90 tablet 3   promethazine (PHENERGAN) 25 MG suppository Place 1 suppository (25 mg total) rectally every  6 (six) hours as needed for nausea or vomiting. 12 each 0   prochlorperazine (COMPAZINE) 25 MG suppository Place 1 suppository (25 mg total) rectally every 12 (twelve) hours as needed for nausea or vomiting. (Patient not taking: Reported on 08/02/2022) 12 suppository 0   Current Facility-Administered Medications  Medication Dose Route Frequency Provider Last Rate Last Admin   0.9 %  sodium chloride infusion  500 mL Intravenous Continuous Jenafer Winterton V, DO        Allergies as of 08/02/2022 - Review Complete 08/02/2022   Allergen Reaction Noted   Penicillins Hives 06/24/2019   Flexeril [cyclobenzaprine]  04/05/2022    Family History  Problem Relation Age of Onset   Fibromyalgia Mother    Thyroid disease Mother        Living   Lupus Mother    Anxiety disorder Mother    Clotting disorder Mother    Crohn's disease Mother    Hypertension Father    Hyperlipidemia Father        Living   Liver disease Father    Anxiety disorder Sister    ADD / ADHD Brother    Seizures Brother    Anxiety disorder Maternal Aunt    Depression Maternal Aunt    Lupus Maternal Aunt    Diabetes Paternal Aunt    Lupus Paternal Aunt    Fibromyalgia Paternal Aunt    Drug abuse Paternal Uncle    Diabetes Maternal Grandmother    Hypertension Maternal Grandmother    Liver disease Maternal Grandmother    Bone cancer Paternal Grandmother    Heart disease Paternal Grandfather    Diabetes Paternal Grandfather    Migraines Other        Various   Colon cancer Neg Hx    Esophageal cancer Neg Hx    Rectal cancer Neg Hx    Stomach cancer Neg Hx     Social History   Socioeconomic History   Marital status: Single    Spouse name: Not on file   Number of children: 1   Years of education: 12   Highest education level: Not on file  Occupational History   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, [employer]  Tobacco Use   Smoking status: Every Day    Packs/day: 0.50    Years: 2.00    Total pack years: 1.00    Types: Cigarettes   Smokeless tobacco: Never   Tobacco comments:    1 pack every 2-3 days  Vaping Use   Vaping Use: Never used  Substance and Sexual Activity   Alcohol use: Yes    Alcohol/week: 0.0 standard drinks of alcohol    Comment: socially   Drug use: Yes    Types: Marijuana    Comment: Daily- smoked at 5 am 08/02/22   Sexual activity: Yes  Other Topics Concern   Not on file  Social History Narrative   Lives with aunt   No caffeine   Social Determinants of Health   Financial Resource Strain: Not on  file  Food Insecurity: Not on file  Transportation Needs: Not on file  Physical Activity: Not on file  Stress: Not on file  Social Connections: Not on file  Intimate Partner Violence: Not on file    Physical Exam: Vital signs in last 24 hours: @BP  126/89   Pulse (!) 116   Temp 98.2 F (36.8 C)   Ht 6' (1.829 m)   Wt 194 lb (88 kg)   SpO2 96%  BMI 26.31 kg/m  GEN: NAD EYE: Sclerae anicteric ENT: MMM CV: Non-tachycardic Pulm: CTA b/l GI: Soft, NT/ND NEURO:  Alert & Oriented x 3   Doristine Locks, DO [facility]   08/02/2022 8:42 AM

## 2022-08-03 ENCOUNTER — Telehealth: Payer: Self-pay | Admitting: *Deleted

## 2022-08-03 NOTE — Telephone Encounter (Signed)
  Follow up Call-     08/02/2022    8:15 AM  Call back number  Post procedure Call Back phone  # [phone]  Permission to leave phone message Yes     Patient questions:  Do you have a fever, pain , or abdominal swelling? No. Pain Score  0 *  Have you tolerated food without any problems? Yes.    Have you been able to return to your normal activities? Yes.    Do you have any questions about your discharge instructions: Diet   No. Medications  No. Follow up visit  No.  Do you have questions or concerns about your Care? No.  Actions: * If pain score is 4 or above: No action needed, pain <4.

## 2022-08-06 ENCOUNTER — Encounter: Payer: Self-pay | Admitting: Gastroenterology

## 2022-08-13 ENCOUNTER — Encounter: Payer: Self-pay | Admitting: Neurology

## 2022-08-14 NOTE — Telephone Encounter (Signed)
Agree with recommendations.  

## 2022-08-15 NOTE — Telephone Encounter (Signed)
Letter printed and placed in MD's office for review and signature.

## 2022-08-21 ENCOUNTER — Ambulatory Visit (AMBULATORY_SURGERY_CENTER): Payer: Managed Care, Other (non HMO) | Admitting: *Deleted

## 2022-08-21 ENCOUNTER — Other Ambulatory Visit: Payer: Self-pay

## 2022-08-21 VITALS — Ht 72.0 in

## 2022-08-21 DIAGNOSIS — K2970 Gastritis, unspecified, without bleeding: Secondary | ICD-10-CM

## 2022-08-21 NOTE — Progress Notes (Signed)
Pre visit completed In person. Follow-up EGD from 08/02/22. Patient acknowledged understanding no marijuana/THC day of procedure. No egg or soy allergy known to patient  No issues known to pt with past sedation with any surgeries or procedures Patient denies ever being told they had issues or difficulty with intubation  No FH of Malignant Hyperthermia Pt is not on diet pills Pt is not on  home 02  Pt is not on blood thinners  Pt denies issues with constipation  No A fib or A flutter  Pt instructed to use Single[employer]com or GoodRx for a price reduction on prep

## 2022-08-22 ENCOUNTER — Other Ambulatory Visit: Payer: Self-pay | Admitting: Family Medicine

## 2022-08-22 DIAGNOSIS — J309 Allergic rhinitis, unspecified: Secondary | ICD-10-CM

## 2022-08-23 ENCOUNTER — Encounter: Payer: Self-pay | Admitting: Neurology

## 2022-08-24 ENCOUNTER — Other Ambulatory Visit: Payer: Managed Care, Other (non HMO)

## 2022-08-27 ENCOUNTER — Encounter: Payer: Self-pay | Admitting: Neurology

## 2022-08-27 ENCOUNTER — Other Ambulatory Visit: Payer: Self-pay | Admitting: Gastroenterology

## 2022-09-13 ENCOUNTER — Encounter: Payer: Self-pay | Admitting: Internal Medicine

## 2022-09-13 ENCOUNTER — Ambulatory Visit (AMBULATORY_SURGERY_CENTER): Payer: Managed Care, Other (non HMO) | Admitting: Internal Medicine

## 2022-09-13 VITALS — BP 98/82 | HR 86 | Temp 97.5°F | Resp 24 | Ht 72.0 in | Wt 208.4 lb

## 2022-09-13 DIAGNOSIS — K2100 Gastro-esophageal reflux disease with esophagitis, without bleeding: Secondary | ICD-10-CM | POA: Diagnosis present

## 2022-09-13 DIAGNOSIS — K2970 Gastritis, unspecified, without bleeding: Secondary | ICD-10-CM

## 2022-09-13 DIAGNOSIS — K219 Gastro-esophageal reflux disease without esophagitis: Secondary | ICD-10-CM

## 2022-09-13 MED ORDER — SUCRALFATE 1 GM/10ML PO SUSP: 1.0000 g | Freq: Three times a day (TID) | ORAL | 3 refills | Status: DC

## 2022-09-13 MED ORDER — SODIUM CHLORIDE 0.9 % IV SOLN: 500.0000 mL | Freq: Once | INTRAVENOUS | Status: DC

## 2022-09-13 NOTE — Patient Instructions (Addendum)
Things look better - less inflammation in the esophagus. Some food was in the stomach so it was not seen well.   I want you to continue your current medications and see me in the office in 6 to 8 weeks - we will make an appointment.  Please resume your diet and medications.  I appreciate the opportunity to care for you. Iva Boop, MD, [facility]   Read all of the handouts given to you by your recovery room nurse.  Resume all of your current medications.  Keep your office visit with Dr Leone Payor in February.  Keep an eye on the swollen area.  If it gets worse call us.  YOU HAD AN ENDOSCOPIC PROCEDURE TODAY AT THE [facility]:   Refer to the procedure report that was given to you for any specific questions about what was found during the examination.  If the procedure report does not answer your questions, please call your gastroenterologist to clarify.  If you requested that your care partner not be given the details of your procedure findings, then the procedure report has been included in a sealed envelope for you to review at your convenience later.  YOU SHOULD EXPECT: Some feelings of bloating in the abdomen. Passage of more gas than usual.  Walking can help get rid of the air that was put into your GI tract during the procedure and reduce the bloating.   Please Note:  You might notice some irritation and congestion in your nose or some drainage.  This is from the oxygen used during your procedure.  There is no need for concern and it should clear up in a day or so.  SYMPTOMS TO REPORT IMMEDIATELY:  Following upper endoscopy (EGD)  Vomiting of blood or coffee ground material  New chest pain or pain under the shoulder blades  Painful or persistently difficult swallowing  New shortness of breath  Fever of 100F or higher  Black, tarry-looking stools  For urgent or emergent issues, a gastroenterologist can be reached at any hour by calling [phone]. Do not use MyChart  messaging for urgent concerns.    DIET:  We do recommend a small meal at first, but then you may proceed to your regular diet.  Drink plenty of fluids but you should avoid alcoholic beverages for 24 hours.  ACTIVITY:  You should plan to take it easy for the rest of today and you should NOT DRIVE or use heavy machinery until tomorrow (because of the sedation medicines used during the test).    FOLLOW UP: Our staff will call the number listed on your records the next business day following your procedure.  We will call around 7:15- 8:00 am to check on you and address any questions or concerns that you may have regarding the information given to you following your procedure. If we do not reach you, we will leave a message.      SIGNATURES/CONFIDENTIALITY: You and/or your care partner have signed paperwork which will be entered into your electronic medical record.  These signatures attest to the fact that that the information above on your After Visit Summary has been reviewed and is understood.  Full responsibility of the confidentiality of this discharge information lies with you and/or your care-partner.

## 2022-09-13 NOTE — Progress Notes (Signed)
Patient states swelling on r cheek area.  No pain noted.  Checked out by Dr. Leone Payor.  Told to have the patient keep an eye on it.  Could be from the strap used for the bite block.  See Dr Marvell Fuller note.

## 2022-09-13 NOTE — Progress Notes (Signed)
Pt's states no medical or surgical changes since previsit or office visit. VS assessed by C.W 

## 2022-09-13 NOTE — Op Note (Signed)
[facility] Patient Name: Andrew Castillo Procedure Date: 09/13/2022 9:45 AM MRN: [id number] Endoscopist: Iva Boop , MD, [id number] Age: 26 Referring MD:  Date of Birth: [birth year] Gender: Male Account #: [id number] Procedure:                Upper GI endoscopy Indications:              Reflux esophagitis, Follow-up of reflux esophagitis Medicines:                Monitored Anesthesia Care Procedure:                Pre-Anesthesia Assessment:                           - Prior to the procedure, a History and Physical                            was performed, and patient medications and                            allergies were reviewed. The patient's tolerance of                            previous anesthesia was also reviewed. The risks                            and benefits of the procedure and the sedation                            options and risks were discussed with the patient.                            All questions were answered, and informed consent                            was obtained. Prior Anticoagulants: The patient has                            taken no anticoagulant or antiplatelet agents. ASA                            Grade Assessment: II - A patient with mild systemic                            disease. After reviewing the risks and benefits,                            the patient was deemed in satisfactory condition to                            undergo the procedure.                           After obtaining informed consent, the endoscope was  passed under direct vision. Throughout the                            procedure, the patient's blood pressure, pulse, and                            oxygen saturations were monitored continuously. The                            Endoscope was introduced through the mouth, and                            advanced to the body of the stomach. The upper GI                             endoscopy was accomplished without difficulty. The                            patient tolerated the procedure well. Scope In: Scope Out: Findings:                 LA Grade B (one or more mucosal breaks greater than                            5 mm, not extending between the tops of two mucosal                            folds) esophagitis with no bleeding was found in                            the distal esophagus.                           A 3 cm hiatal hernia was present.                           The gastroesophageal flap valve was visualized                            endoscopically and classified as Hill Grade IV (no                            fold, wide open lumen, hiatal hernia present).                           Food (residue) was found in the gastric body.                           Two diminutive sessile polyps were found in the                            gastric fundus and in the gastric body. Complications:            No immediate complications. Estimated Blood Loss:  Estimated blood loss: none. Impression:               - LA Grade B reflux esophagitis with no bleeding.                            Much improved Class C 08/02/22 - only 1 erosion > 5                            mm now                           - 3 cm hiatal hernia.                           - Gastroesophageal flap valve classified as Hill                            Grade IV (no fold, wide open lumen, hiatal hernia                            present).                           - Food (residue) in the stomach.                           - Two gastric polyps.                           - No specimens collected. Exam to gastric body Recommendation:           - Patient has a contact number available for                            emergencies. The signs and symptoms of potential                            delayed complications were discussed with the                            patient. Return to normal activities  tomorrow.                            Written discharge instructions were provided to the                            patient.                           - Patient has a contact number available for                            emergencies. The signs and symptoms of potential                            delayed complications were discussed  with the                            patient. Return to normal activities tomorrow.                            Written discharge instructions were provided to the                            patient.                           - Continue present medications. bid PPI and qid                            carafate                           - GERD diet                           See me in 6-8 weeks - office visit                           Consider another EGD later in 2024 to document                            complete healing                           Food in stomach today? - limits exam - gastric                            emptying scan normal earlier this year                           depending upon clinical course may need to consider                            anti-reflux procedure/surgery Iva Booparl E [name], MD 09/13/2022 10:14:40 AM This report has been signed electronically.

## 2022-09-13 NOTE — Progress Notes (Signed)
[facility] History and Physical   Primary Care Physician:  Avanell Shackleton, NP-C   Reason for Procedure:   F/u severe reflux  Plan:    EGD     HPI: Andrew Castillo is a 26 y.o. male s/p EGD for hematemesis 08/02/22 - severe GERD found - for reassessment today   Past Medical History:  Diagnosis Date   Childhood asthma    Environmental allergies    GERD (gastroesophageal reflux disease)    Headache    migraines   Seasonal allergic conjunctivitis    Seizures (HCC)    caused by flexiril 01/2022    Past Surgical History:  Procedure Laterality Date   ESOPHAGOGASTRODUODENOSCOPY     Left Knee Menuiscus     "several surguries on same knee"   NM ESOPHAGEAL REFLUX     Right Shoulder Surgery     TONSILLECTOMY     adenoids   UPPER GI ENDOSCOPY      Prior to Admission medications   Medication Sig Start Date End Date Taking? Authorizing Provider  fluticasone (FLONASE) 50 MCG/ACT nasal spray SPRAY 2 SPRAYS INTO EACH NOSTRIL EVERY DAY 08/22/22  Yes [name], [name], NP-C  levocetirizine (XYZAL) 5 MG tablet TAKE 1 TABLET BY MOUTH EVERY DAY IN THE EVENING 08/22/22  Yes [name], [name], NP-C  pantoprazole (PROTONIX) 40 MG tablet Take 1 tablet (40 mg total) by mouth 2 (two) times daily before a meal. 08/02/22  Yes Cirigliano, Vito V, DO  sucralfate (CARAFATE) 1 GM/10ML suspension Take 1 g by mouth 4 (four) times daily -  with meals and at bedtime. 08/02/22 09/13/22 Yes [provider]  COMPRO 25 MG suppository SMARTSIG:1 SUPPOS Rectally Every 12 Hours PRN Patient not taking: Reported on 09/13/2022 08/02/22   [provider]  dicyclomine (BENTYL) 10 MG capsule Take 1 capsule (10 mg total) by mouth 4 (four) times daily -  before meals and at bedtime. Patient taking differently: Take 10 mg by mouth daily as needed for spasms. 04/05/22   Meredith Pel, NP  Eslicarbazepine Acetate (APTIOM) 200 MG TABS Take 1 tablet by mouth daily. Patient not taking:  Reported on 08/21/2022    [provider]  promethazine (PHENERGAN) 25 MG suppository Place 1 suppository (25 mg total) rectally every 6 (six) hours as needed for nausea or vomiting. Patient not taking: Reported on 08/21/2022 08/02/22   Cirigliano, Vito V, DO  sucralfate (CARAFATE) 1 GM/10ML suspension Take 10 mLs (1 g total) by mouth 4 (four) times daily -  with meals and at bedtime for 28 days. 08/02/22 08/30/22  Cirigliano, Verlin Dike, DO    Current Outpatient Medications  Medication Sig Dispense Refill   fluticasone (FLONASE) 50 MCG/ACT nasal spray SPRAY 2 SPRAYS INTO EACH NOSTRIL EVERY DAY 48 mL 1   levocetirizine (XYZAL) 5 MG tablet TAKE 1 TABLET BY MOUTH EVERY DAY IN THE EVENING 90 tablet 0   pantoprazole (PROTONIX) 40 MG tablet Take 1 tablet (40 mg total) by mouth 2 (two) times daily before a meal. 90 tablet 3   sucralfate (CARAFATE) 1 GM/10ML suspension Take 1 g by mouth 4 (four) times daily -  with meals and at bedtime.     COMPRO 25 MG suppository SMARTSIG:1 SUPPOS Rectally Every 12 Hours PRN (Patient not taking: Reported on 09/13/2022)     dicyclomine (BENTYL) 10 MG capsule Take 1 capsule (10 mg total) by mouth 4 (four) times daily -  before meals and at bedtime. (Patient taking differently: Take  10 mg by mouth daily as needed for spasms.) 40 capsule 3   Eslicarbazepine Acetate (APTIOM) 200 MG TABS Take 1 tablet by mouth daily. (Patient not taking: Reported on 08/21/2022)     promethazine (PHENERGAN) 25 MG suppository Place 1 suppository (25 mg total) rectally every 6 (six) hours as needed for nausea or vomiting. (Patient not taking: Reported on 08/21/2022) 30 each 1   sucralfate (CARAFATE) 1 GM/10ML suspension Take 10 mLs (1 g total) by mouth 4 (four) times daily -  with meals and at bedtime for 28 days. 1120 mL 0   Current Facility-Administered Medications  Medication Dose Route Frequency Provider Last Rate Last Admin   0.9 %  sodium chloride infusion  500 mL Intravenous Once  Iva Boop, MD        Allergies as of 09/13/2022 - Review Complete 09/13/2022  Allergen Reaction Noted   Penicillins Hives 06/24/2019   Flexeril [cyclobenzaprine]  04/05/2022    Family History  Problem Relation Age of Onset   Fibromyalgia Mother    Thyroid disease Mother        Living   Lupus Mother    Anxiety disorder Mother    Clotting disorder Mother    Crohn's disease Mother    Hypertension Father    Hyperlipidemia Father        Living   Liver disease Father    Anxiety disorder Sister    ADD / ADHD Brother    Seizures Brother    Anxiety disorder Maternal Aunt    Depression Maternal Aunt    Lupus Maternal Aunt    Diabetes Paternal Aunt    Lupus Paternal Aunt    Fibromyalgia Paternal Aunt    Drug abuse Paternal Uncle    Diabetes Maternal Grandmother    Hypertension Maternal Grandmother    Liver disease Maternal Grandmother    Bone cancer Paternal Grandmother    Heart disease Paternal Grandfather    Diabetes Paternal Grandfather    Migraines Other        Various   Colon cancer Neg Hx    Esophageal cancer Neg Hx    Rectal cancer Neg Hx    Stomach cancer Neg Hx     Social History   Socioeconomic History   Marital status: Single    Spouse name: Not on file   Number of children: 1   Years of education: 12   Highest education level: Not on file  Occupational History   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  Tobacco Use   Smoking status: Every Day    Packs/day: 0.50    Years: 2.00    Total pack years: 1.00    Types: Cigarettes   Smokeless tobacco: Never   Tobacco comments:    1 pack every 2-3 days  Vaping Use   Vaping Use: Never used  Substance and Sexual Activity   Alcohol use: Not Currently    Comment: socially; 24 pack/week per pt.   Drug use: Yes    Types: Marijuana    Comment: Daily- smoked at 5 am 08/02/22   Sexual activity: Yes  Other Topics Concern   Not on file  Social History Narrative   Lives with aunt   No caffeine    Social Determinants of Health   Financial Resource Strain: Not on file  Food Insecurity: Not on file  Transportation Needs: Not on file  Physical Activity: Not on file  Stress: Not on file  Social Connections: Not on file  Intimate Partner Violence: Not on file    Review of Systems:  All other review of systems negative except as mentioned in the HPI.  Physical Exam: Vital signs BP (!) 134/98   Pulse 78   Temp (!) 97.5 F (36.4 C) (Skin)   Resp 13   Ht 6' (1.829 m)   Wt 208 lb 6.4 oz (94.5 kg)   SpO2 99%   BMI 28.26 kg/m   General:   Alert,  Well-developed, well-nourished, pleasant and cooperative in NAD Lungs:  Clear throughout to auscultation.   Heart:  Regular rate and rhythm; no murmurs, clicks, rubs,  or gallops. Abdomen:  Soft, nontender and nondistended. Normal bowel sounds.   Neuro/Psych:  Alert and cooperative. Normal mood and affect. A and O x 3   @Syanna Remmert  , MD, [facility] [phone] (pager) 09/13/2022 9:48 AM@

## 2022-09-13 NOTE — Progress Notes (Signed)
Report to pacu rn. Vss. Care resumed by rn. 

## 2022-09-14 ENCOUNTER — Telehealth: Payer: Self-pay

## 2022-09-14 NOTE — Telephone Encounter (Signed)
Attempted f/u call. No answer, left VM. 

## 2022-09-19 ENCOUNTER — Other Ambulatory Visit: Payer: Self-pay

## 2022-09-19 DIAGNOSIS — K449 Diaphragmatic hernia without obstruction or gangrene: Secondary | ICD-10-CM

## 2022-09-19 DIAGNOSIS — K2100 Gastro-esophageal reflux disease with esophagitis, without bleeding: Secondary | ICD-10-CM

## 2022-09-27 ENCOUNTER — Encounter: Payer: Self-pay | Admitting: Neurology

## 2022-09-27 ENCOUNTER — Ambulatory Visit (INDEPENDENT_AMBULATORY_CARE_PROVIDER_SITE_OTHER): Payer: Managed Care, Other (non HMO) | Admitting: Neurology

## 2022-09-27 VITALS — BP 139/89 | HR 85 | Ht 72.0 in | Wt 200.0 lb

## 2022-09-27 DIAGNOSIS — G40909 Epilepsy, unspecified, not intractable, without status epilepticus: Secondary | ICD-10-CM

## 2022-09-27 MED ORDER — VALTOCO 15 MG DOSE 7.5 MG/0.1ML NA LQPK: 15.0000 mg | NASAL | 2 refills | Status: DC | PRN

## 2022-09-27 MED ORDER — APTIOM 600 MG PO TABS: 600.0000 mg | ORAL_TABLET | Freq: Every day | ORAL | 11 refills | Status: AC

## 2022-09-27 NOTE — Progress Notes (Signed)
[facility]  PATIENT: Andrew Castillo DOB: August 04, 1996  REQUESTING CLINICIAN: Girtha Rm, NP-C HISTORY FROM: Patient  REASON FOR VISIT: Establish care for epilepsy    HISTORICAL  CHIEF COMPLAINT:  Chief Complaint  Patient presents with   Follow-up    Rm 13, alone States he is stable, would like to return to work and discuss eeg results     INTERVAL HISTORY 09/27/2022:  Andrew Castillo presents today for follow-up, last visit was in October, at that time we started him on Aptiom.  He reported he has been taking the medication inconsistently due to his vomiting and GI issues.  He completed his EEG which showed left frontal slowing, no epileptiform discharge, did not complete the MRI yet.  He reports that he has not had any additional seizures.  He is still working with GI to resolve his issue with vomiting, again has not been taking the medication consistently.  No other complaints.  He would like to go back to work.  He does work in [employer] units.  He does not work on roofs. He did propose to his fiancee and planning a wedding for next year.    HISTORY OF PRESENT ILLNESS:  This is a 27 year old man with past medical history of vomiting, seizures who is presenting to establish care  for seizure disorder.  Patient reports the first seizure happened in 2017, it came out of nowhere.  He reported prior to the seizure he was up all night and was drinking.  Since then he had additional seizures and the seizures do happen with sleep deprivation.  His last seizure was in May.  He reported he was at a golf tournament, again he was up all night drinking and had a seizure on the golf course.  He was told with his seizure he will have behavioral arrest, he will stop talking, and his head would deviate to the left before he falls down and start shaking.  He does have foaming at the mouth and tongue biting associated with the seizures.  Denies any injury with the seizure but states that mother  and brother have epilepsy.  He has never been started on antiseizure medication currently is not taking anything.   Handedness: Right  Onset: 2017   Seizure Type: Behavioral arrest, head deviation to the left and generalized shaking with foaming at the mouth and tongue biting   Current frequency: Last seizure was in May  Any injuries from seizures: None   Seizure risk factors: Mother and brother with seizure,   Previous ASMs: None   Currenty ASMs: Aptiom but taking it inconsistently  ASMs side effects: N/A   Brain Images: Not available for review  Previous EEGs: Not available   OTHER MEDICAL CONDITIONS: GI issues, vomiting  REVIEW OF SYSTEMS: Full 14 system review of systems performed and negative with exception of: As noted in the HPI   ALLERGIES: Allergies  Allergen Reactions   Penicillins Hives    Did it involve swelling of the face/tongue/throat, SOB, or low BP? Unknown Did it involve sudden or severe rash/hives, skin peeling, or any reaction on the inside of your mouth or nose? Unknown Did you need to seek medical attention at a hospital or doctor's office? Unknown When did it last happen?   Pt doesn't remember he said he was a child    If all above answers are "NO", may proceed with cephalosporin use.    Flexeril [Cyclobenzaprine]     seizures  HOME MEDICATIONS: Outpatient Medications Prior to Visit  Medication Sig Dispense Refill   COMPRO 25 MG suppository SMARTSIG:1 SUPPOS Rectally Every 12 Hours PRN (Patient not taking: Reported on 09/13/2022)     dicyclomine (BENTYL) 10 MG capsule Take 1 capsule (10 mg total) by mouth 4 (four) times daily -  before meals and at bedtime. (Patient taking differently: Take 10 mg by mouth daily as needed for spasms.) 40 capsule 3   fluticasone (FLONASE) 50 MCG/ACT nasal spray SPRAY 2 SPRAYS INTO EACH NOSTRIL EVERY DAY 48 mL 1   levocetirizine (XYZAL) 5 MG tablet TAKE 1 TABLET BY MOUTH EVERY DAY IN THE EVENING 90 tablet 0    pantoprazole (PROTONIX) 40 MG tablet Take 1 tablet (40 mg total) by mouth 2 (two) times daily before a meal. 90 tablet 3   promethazine (PHENERGAN) 25 MG suppository Place 1 suppository (25 mg total) rectally every 6 (six) hours as needed for nausea or vomiting. (Patient not taking: Reported on 08/21/2022) 30 each 1   sucralfate (CARAFATE) 1 GM/10ML suspension Take 10 mLs (1 g total) by mouth 4 (four) times daily -  with meals and at bedtime. 8756 mL 3   Eslicarbazepine Acetate (APTIOM) 200 MG TABS Take 1 tablet by mouth daily. (Patient not taking: Reported on 08/21/2022)     No facility-administered medications prior to visit.    PAST MEDICAL HISTORY: Past Medical History:  Diagnosis Date   Childhood asthma    Environmental allergies    GERD (gastroesophageal reflux disease)    Headache    migraines   Seasonal allergic conjunctivitis    Seizures ([place])    caused by flexiril 01/2022    PAST SURGICAL HISTORY: Past Surgical History:  Procedure Laterality Date   ESOPHAGOGASTRODUODENOSCOPY     Left Knee Menuiscus     "several surguries on same knee"   NM ESOPHAGEAL REFLUX     Right Shoulder Surgery     TONSILLECTOMY     adenoids   UPPER GI ENDOSCOPY      FAMILY HISTORY: Family History  Problem Relation Age of Onset   Fibromyalgia Mother    Thyroid disease Mother        Living   Lupus Mother    Anxiety disorder Mother    Clotting disorder Mother    Crohn's disease Mother    Hypertension Father    Hyperlipidemia Father        Living   Liver disease Father    Anxiety disorder Sister    ADD / ADHD Brother    Seizures Brother    Anxiety disorder Maternal Aunt    Depression Maternal Aunt    Lupus Maternal Aunt    Diabetes Paternal Aunt    Lupus Paternal Aunt    Fibromyalgia Paternal Aunt    Drug abuse Paternal Uncle    Diabetes Maternal Grandmother    Hypertension Maternal Grandmother    Liver disease Maternal Grandmother    Bone cancer Paternal Grandmother    Heart  disease Paternal Grandfather    Diabetes Paternal Grandfather    Migraines Other        Various   Colon cancer Neg Hx    Esophageal cancer Neg Hx    Rectal cancer Neg Hx    Stomach cancer Neg Hx     SOCIAL HISTORY: Social History   Socioeconomic History   Marital status: Single    Spouse name: Not on file   Number of children: 1   Years of education:  12   Highest education level: Not on file  Occupational History   Occupation: Engineering geologist  Tobacco Use   Smoking status: Every Day    Packs/day: 0.50    Years: 2.00    Total pack years: 1.00    Types: Cigarettes   Smokeless tobacco: Never   Tobacco comments:    1 pack every 2-3 days  Vaping Use   Vaping Use: Never used  Substance and Sexual Activity   Alcohol use: Not Currently    Comment: socially; 24 pack/week per pt.   Drug use: Yes    Types: Marijuana    Comment: Daily- smoked at 5 am 08/02/22   Sexual activity: Yes  Other Topics Concern   Not on file  Social History Narrative   Lives with aunt   No caffeine   Social Determinants of Health   Financial Resource Strain: Not on file  Food Insecurity: Not on file  Transportation Needs: Not on file  Physical Activity: Not on file  Stress: Not on file  Social Connections: Not on file  Intimate Partner Violence: Not on file    PHYSICAL EXAM  GENERAL EXAM/CONSTITUTIONAL: Vitals:  Vitals:   09/27/22 1536  BP: 139/89  Pulse: 85  Weight: 200 lb (90.7 kg)  Height: 6' (1.829 m)    Body mass index is 27.12 kg/m. Wt Readings from Last 3 Encounters:  09/27/22 200 lb (90.7 kg)  09/13/22 208 lb 6.4 oz (94.5 kg)  08/02/22 194 lb (88 kg)   Patient is in no distress; well developed, nourished and groomed; neck is supple  EYES: Pupils round and reactive to light, Visual fields full to confrontation, Extraocular movements intacts,  No results found.  MUSCULOSKELETAL: Gait, strength, tone, movements noted in Neurologic exam  below  NEUROLOGIC: MENTAL STATUS:      No data to display         awake, alert, oriented to person, place and time recent and remote memory intact normal attention and concentration language fluent, comprehension intact, naming intact fund of knowledge appropriate  CRANIAL NERVE:  2nd, 3rd, 4th, 6th - visual fields full to confrontation, extraocular muscles intact, no nystagmus 5th - facial sensation symmetric 7th - facial strength symmetric 8th - hearing intact 9th - palate elevates symmetrically, uvula midline 11th - shoulder shrug symmetric 12th - tongue protrusion midline  MOTOR:  normal bulk and tone, full strength in the BUE, BLE  SENSORY:  normal and symmetric to light touch  COORDINATION:  finger-nose-finger, fine finger movements normal  REFLEXES:  deep tendon reflexes present and symmetric  GAIT/STATION:  normal    DIAGNOSTIC DATA (LABS, IMAGING, TESTING) - I reviewed patient records, labs, notes, testing and imaging myself where available.  Lab Results  Component Value Date   WBC 22.0 (H) 07/28/2022   HGB 17.4 (H) 07/28/2022   HCT 50.5 07/28/2022   MCV 85.4 07/28/2022   PLT 322 07/28/2022      Component Value Date/Time   NA 138 07/28/2022 2209   K 3.0 (L) 07/28/2022 2209   CL 96 (L) 07/28/2022 2209   CO2 27 07/28/2022 2209   GLUCOSE 114 (H) 07/28/2022 2209   BUN 21 (H) 07/28/2022 2209   CREATININE 1.20 07/28/2022 2209   CALCIUM 9.6 07/28/2022 2209   PROT 9.0 (H) 07/28/2022 2209   ALBUMIN 5.0 07/28/2022 2209   AST 39 07/28/2022 2209   ALT 67 (H) 07/28/2022 2209   ALKPHOS 62 07/28/2022 2209   BILITOT 2.0 (  H) 07/28/2022 2209   GFRNONAA >60 07/28/2022 2209   GFRAA >60 10/14/2019 1459   Lab Results  Component Value Date   CHOL 168 05/04/2016   HDL 41.60 05/04/2016   LDLCALC 105 (H) 05/04/2016   TRIG 109.0 05/04/2016   Lab Results  Component Value Date   HGBA1C 5.2 05/23/2022   No results found for: "VITAMINB12" Lab Results   Component Value Date   TSH 1.19 07/15/2015   EEG 07/12/2022 Abnormality: Left frontal slowing   Impression: This is an abnormal EEG recorded while drowsy and awake due to presence of left frontal slowing which is consistent with an area of neuronal dysfunction in the left frontal region.    ASSESSMENT AND PLAN  27 y.o. year old male  with history of epilepsy and recurrent vomiting here for follow up.  He completed his EEG showing left frontal slowing and MRI has not been done.  He was started on Aptiom, but he has been taking the medication inconsistently.  Fortunately for him he did not have any breakthrough seizure. His last seizure was in May, therefore he may resume driving. He may also return to work. Spent additional time discussing with patient need to be compliant with his medication.  He voices understanding.  I also advised him to follow-up and have his MRI brain completed.  I will see him in 6 months for follow-up or sooner if is if worse.    1. Seizure disorder [facility])      Patient Instructions  Continue with Aptiom 600 mg daily Continue to follow with GI Continue your other medications Complete MRI brain Follow-up in 6 months or sooner if worse.   Per [facility] statutes, patients with seizures are not allowed to drive until they have been seizure-free for six months.  Other recommendations include using caution when using heavy equipment or power tools. Avoid working on ladders or at heights. Take showers instead of baths.  Do not swim alone.  Ensure the water temperature is not too high on the home water heater. Do not go swimming alone. Do not lock yourself in a room alone (i.e. bathroom). When caring for infants or small children, sit down when holding, feeding, or changing them to minimize risk of injury to the child in the event you have a seizure. Maintain good sleep hygiene. Avoid alcohol.  Also recommend adequate sleep, hydration, good diet and minimize  stress.   During the Seizure  - First, ensure adequate ventilation and place patients on the floor on their left side  Loosen clothing around the neck and ensure the airway is patent. If the patient is clenching the teeth, do not force the mouth open with any object as this can cause severe damage - Remove all items from the surrounding that can be hazardous. The patient may be oblivious to what's happening and may not even know what he or she is doing. If the patient is confused and wandering, either gently guide him/her away and block access to outside areas - Reassure the individual and be comforting - Call 911. In most cases, the seizure ends before EMS arrives. However, there are cases when seizures may last over 3 to 5 minutes. Or the individual may have developed breathing difficulties or severe injuries. If a pregnant patient or a person with diabetes develops a seizure, it is prudent to call an ambulance. - Finally, if the patient does not regain full consciousness, then call EMS. Most patients will remain confused for  about 45 to 90 minutes after a seizure, so you must use judgment in calling for help. - Avoid restraints but make sure the patient is in a bed with padded side rails - Place the individual in a lateral position with the neck slightly flexed; this will help the saliva drain from the mouth and prevent the tongue from falling backward - Remove all nearby furniture and other hazards from the area - Provide verbal assurance as the individual is regaining consciousness - Provide the patient with privacy if possible - Call for help and start treatment as ordered by the caregiver   After the Seizure (Postictal Stage)  After a seizure, most patients experience confusion, fatigue, muscle pain and/or a headache. Thus, one should permit the individual to sleep. For the next few days, reassurance is essential. Being calm and helping reorient the person is also of importance.  Most  seizures are painless and end spontaneously. Seizures are not harmful to others but can lead to complications such as stress on the lungs, brain and the heart. Individuals with prior lung problems may develop labored breathing and respiratory distress.     No orders of the defined types were placed in this encounter.   Meds ordered this encounter  Medications   Eslicarbazepine Acetate (APTIOM) 600 MG TABS    Sig: Take 600 mg by mouth daily.    Dispense:  30 tablet    Refill:  11   diazePAM, 15 MG Dose, (VALTOCO 15 MG DOSE) 2 x 7.5 MG/0.1ML LQPK    Sig: Place 15 mg into the nose as needed (For seizure lasting more than 5 minutes or for seizure clusters).    Dispense:  2 each    Refill:  2    Return in about 6 months (around 03/28/2023).  I have spent a total of 30 minutes dedicated to this patient today, preparing to see patient, performing a medically appropriate examination and evaluation, ordering tests and/or medications and procedures, and counseling and educating the patient/family/caregiver; independently interpreting result and communicating results to the family/patient/caregiver; and documenting clinical information in the electronic medical record.   Alric Ran, MD 09/28/2022, 9:15 AM  [facility] [address] [place], [place] [address] [phone]

## 2022-09-28 NOTE — Patient Instructions (Signed)
Continue with Aptiom 600 mg daily Continue to follow with GI Continue your other medications Complete MRI brain Follow-up in 6 months or sooner if worse.

## 2022-10-19 ENCOUNTER — Telehealth: Payer: Self-pay

## 2022-10-19 NOTE — Telephone Encounter (Signed)
PA request received via CMM for Valtoco 15 MG Dose 7.5MG /0.1ML liquid  PA submitted to [facility] and DENIED due to patient having active workers comp coverage. Medication needs to be processed through workers comp.   Key: [id number]

## 2022-10-21 ENCOUNTER — Other Ambulatory Visit: Payer: Self-pay

## 2022-10-21 ENCOUNTER — Emergency Department (HOSPITAL_COMMUNITY)
Admission: EM | Admit: 2022-10-21 | Discharge: 2022-10-21 | Disposition: A | Discharge status: Home / Self Care | Payer: BC Managed Care – PPO | Attending: Emergency Medicine | Admitting: Emergency Medicine

## 2022-10-21 DIAGNOSIS — R17 Unspecified jaundice: Secondary | ICD-10-CM | POA: Diagnosis not present

## 2022-10-21 DIAGNOSIS — R112 Nausea with vomiting, unspecified: Secondary | ICD-10-CM | POA: Diagnosis not present

## 2022-10-21 DIAGNOSIS — R030 Elevated blood-pressure reading, without diagnosis of hypertension: Secondary | ICD-10-CM | POA: Diagnosis not present

## 2022-10-21 DIAGNOSIS — R7309 Other abnormal glucose: Secondary | ICD-10-CM | POA: Diagnosis not present

## 2022-10-21 DIAGNOSIS — E876 Hypokalemia: Secondary | ICD-10-CM | POA: Diagnosis not present

## 2022-10-21 LAB — CBC
HCT: 47.7 % (ref 39.0–52.0)
Hemoglobin: 16 g/dL (ref 13.0–17.0)
MCH: 29.3 pg (ref 26.0–34.0)
MCHC: 33.5 g/dL (ref 30.0–36.0)
MCV: 87.4 fL (ref 80.0–100.0)
Platelets: 310 10*3/uL (ref 150–400)
RBC: 5.46 MIL/uL (ref 4.22–5.81)
RDW: 12.6 % (ref 11.5–15.5)
WBC: 17.5 10*3/uL — ABNORMAL HIGH (ref 4.0–10.5)
nRBC: 0 % (ref 0.0–0.2)

## 2022-10-21 LAB — COMPREHENSIVE METABOLIC PANEL
ALT: 20 U/L (ref 0–44)
AST: 21 U/L (ref 15–41)
Albumin: 4.8 g/dL (ref 3.5–5.0)
Alkaline Phosphatase: 51 U/L (ref 38–126)
Anion gap: 15 (ref 5–15)
BUN: 20 mg/dL (ref 6–20)
CO2: 25 mmol/L (ref 22–32)
Calcium: 9.4 mg/dL (ref 8.9–10.3)
Chloride: 96 mmol/L — ABNORMAL LOW (ref 98–111)
Creatinine, Ser: 1.15 mg/dL (ref 0.61–1.24)
GFR, Estimated: >60 mL/min (ref >60)
Glucose, Bld: 139 mg/dL — ABNORMAL HIGH (ref 70–99)
Potassium: 3.4 mmol/L — ABNORMAL LOW (ref 3.5–5.1)
Sodium: 136 mmol/L (ref 135–145)
Total Bilirubin: 1.4 mg/dL — ABNORMAL HIGH (ref 0.3–1.2)
Total Protein: 7.5 g/dL (ref 6.5–8.1)

## 2022-10-21 LAB — TYPE AND SCREEN
ABO/RH(D): O POS
Antibody Screen: NEGATIVE

## 2022-10-21 MED ORDER — DROPERIDOL 2.5 MG/ML IJ SOLN
2.5000 mg | Freq: Once | INTRAMUSCULAR | Status: AC
  Administered 2022-10-21: 2.5 mg via INTRAVENOUS
  Filled 2022-10-21: qty 2

## 2022-10-21 MED ORDER — HALOPERIDOL LACTATE 5 MG/ML IJ SOLN
5.0000 mg | Freq: Once | INTRAMUSCULAR | Status: AC
  Administered 2022-10-21: 5 mg via INTRAVENOUS
  Filled 2022-10-21: qty 1

## 2022-10-21 MED ORDER — HALOPERIDOL 5 MG PO TABS: 5.0000 mg | ORAL_TABLET | Freq: Four times a day (QID) | ORAL | 0 refills | Status: DC | PRN

## 2022-10-21 MED ORDER — SODIUM CHLORIDE 0.9 % IV BOLUS
1000.0000 mL | Freq: Once | INTRAVENOUS | Status: AC
  Administered 2022-10-21: 1000 mL via INTRAVENOUS

## 2022-10-21 MED ORDER — PROCHLORPERAZINE 25 MG RE SUPP: 25.0000 mg | Freq: Two times a day (BID) | RECTAL | 0 refills | Status: DC | PRN

## 2022-10-21 MED ORDER — ONDANSETRON 8 MG PO TBDP: 8.0000 mg | ORAL_TABLET | Freq: Three times a day (TID) | ORAL | 0 refills | Status: DC | PRN

## 2022-10-21 MED ORDER — METOCLOPRAMIDE HCL 5 MG/ML IJ SOLN
10.0000 mg | Freq: Once | INTRAMUSCULAR | Status: AC
  Administered 2022-10-21: 10 mg via INTRAVENOUS
  Filled 2022-10-21: qty 2

## 2022-10-21 MED ORDER — ONDANSETRON HCL 4 MG/2ML IJ SOLN
4.0000 mg | Freq: Once | INTRAMUSCULAR | Status: AC
  Administered 2022-10-21: 4 mg via INTRAVENOUS
  Filled 2022-10-21: qty 2

## 2022-10-21 MED ORDER — ONDANSETRON HCL 8 MG PO TABS: 8.0000 mg | ORAL_TABLET | Freq: Three times a day (TID) | ORAL | 0 refills | Status: DC | PRN

## 2022-10-21 NOTE — ED Notes (Signed)
Pt vomiting after attempting to drink sprite. MD notified.

## 2022-10-21 NOTE — ED Provider Notes (Signed)
  Physical Exam  BP (!) 156/105 (BP Location: Right Arm)   Pulse 82   Temp 97.8 F (36.6 C) (Oral)   Resp 18   Ht 1.829 m (6')   Wt 90.7 kg   SpO2 96%   BMI 27.12 kg/m   Physical Exam  Procedures  Procedures  ED Course / MDM    Medical Decision Making Amount and/or Complexity of Data Reviewed Labs: ordered.  Risk Prescription drug management.   Received from Dr. Roxanne Mins Patient with cyclical vomiting syndrome Oral fluid challenge and d/c Patient somewhat improved and tolerating fluids he states that he uses Compazine suppositories at home and does not have any other antiemetics.  He also has Bentyl.  Plan prescribe his Compazine suppositories and Zofran ODT use Patient appears stable for discharge Cussed return precautions and need for follow-up     Andrew Boss, MD 10/21/22 1027

## 2022-10-21 NOTE — ED Notes (Signed)
Pt given fluid for PO challenge.

## 2022-10-21 NOTE — ED Notes (Signed)
Pt declined monitoring equipment, states the cords make him have a panic attack.

## 2022-10-21 NOTE — ED Triage Notes (Addendum)
Pt presents from home for emesis and general abd pain. Has been experiencing frequent episode requiring endoscopy this past year.  Endorses coffee ground appearance to emesis, chills, fever  He endorses etOH and cocaine this weekend for his birthday and states this has caused this in the past.   H/o GERD, hernia

## 2022-10-21 NOTE — ED Provider Notes (Signed)
[facility] Provider Note   CSN: [id number] Arrival date & time: 10/21/22  0145     History  Chief Complaint  Patient presents with   Emesis    Andrew Castillo is a 27 y.o. male.  The history is provided by the patient.  Emesis He has history of GERD, seizures and comes in with persistent vomiting for 2 days.  He has been having problems with recurrent vomiting for a year and has been evaluated by a gastroenterologist.  He has promethazine suppositories at home which are usually helpful, but have not been helpful for the last 2 days.  He denies abdominal pain, fever, chills, sweats.  There has been no diarrhea.   Home Medications Prior to Admission medications   Medication Sig Start Date End Date Taking? Authorizing Provider  COMPRO 25 MG suppository SMARTSIG:1 SUPPOS Rectally Every 12 Hours PRN Patient not taking: Reported on 09/13/2022 08/02/22   [provider]  diazePAM, 15 MG Dose, (VALTOCO 15 MG DOSE) 2 x 7.5 MG/0.1ML LQPK Place 15 mg into the nose as needed (For seizure lasting more than 5 minutes or for seizure clusters). 09/27/22   Alric Ran, MD  dicyclomine (BENTYL) 10 MG capsule Take 1 capsule (10 mg total) by mouth 4 (four) times daily -  before meals and at bedtime. Patient taking differently: Take 10 mg by mouth daily as needed for spasms. 04/05/22   Willia Craze, NP  Eslicarbazepine Acetate (APTIOM) 600 MG TABS Take 600 mg by mouth daily. 09/27/22 10/27/22  Alric Ran, MD  fluticasone (FLONASE) 50 MCG/ACT nasal spray SPRAY 2 SPRAYS INTO EACH NOSTRIL EVERY DAY 08/22/22   [name], [name], NP-C  levocetirizine (XYZAL) 5 MG tablet TAKE 1 TABLET BY MOUTH EVERY DAY IN THE EVENING 08/22/22   [name], [name], NP-C  pantoprazole (PROTONIX) 40 MG tablet Take 1 tablet (40 mg total) by mouth 2 (two) times daily before a meal. 08/02/22   Cirigliano, Vito V, DO  promethazine (PHENERGAN) 25 MG suppository Place 1  suppository (25 mg total) rectally every 6 (six) hours as needed for nausea or vomiting. Patient not taking: Reported on 08/21/2022 08/02/22   Cirigliano, Vito V, DO  sucralfate (CARAFATE) 1 GM/10ML suspension Take 10 mLs (1 g total) by mouth 4 (four) times daily -  with meals and at bedtime. 09/13/22 01/11/23  Gatha Mayer, MD      Allergies    Penicillins and Flexeril [cyclobenzaprine]    Review of Systems   Review of Systems  Gastrointestinal:  Positive for vomiting.  All other systems reviewed and are negative.   Physical Exam Updated Vital Signs BP (!) 156/105 (BP Location: Right Arm)   Pulse 82   Temp 97.8 F (36.6 C) (Oral)   Resp 18   Ht 6' (1.829 m)   Wt 90.7 kg   SpO2 96%   BMI 27.12 kg/m  Physical Exam Vitals and nursing note reviewed.   27 year old male, resting comfortably and in no acute distress. Vital signs are significant for elevated blood pressure. Oxygen saturation is 96%, which is normal. Head is normocephalic and atraumatic. PERRLA, EOMI. Oropharynx is clear.  Mucous membranes are moist. Neck is nontender and supple without adenopathy. Back is nontender and there is no CVA tenderness. Lungs are clear without rales, wheezes, or rhonchi. Chest is nontender. Heart has regular rate and rhythm without murmur. Abdomen is soft, flat, nontender.  Peristalsis is hypoactive. Extremities have no  cyanosis or edema, full range of motion is present. Skin is warm and dry without rash. Neurologic: Mental status is normal, cranial nerves are intact, moves all extremities equally.  ED Results / Procedures / Treatments   Labs (all labs ordered are listed, but only abnormal results are displayed) Labs Reviewed  COMPREHENSIVE METABOLIC PANEL - Abnormal; Notable for the following components:      Result Value   Potassium 3.4 (*)    Chloride 96 (*)    Glucose, Bld 139 (*)    Total Bilirubin 1.4 (*)    All other components within normal limits  CBC - Abnormal;  Notable for the following components:   WBC 17.5 (*)    All other components within normal limits  POC OCCULT BLOOD, ED  TYPE AND SCREEN  ABO/RH    EKG None  Radiology No results found.  Procedures Procedures  Cardiac monitor shows normal sinus rhythm, per my interpretation.  Medications Ordered in ED Medications  sodium chloride 0.9 % bolus 1,000 mL (has no administration in time range)  metoCLOPramide (REGLAN) injection 10 mg (has no administration in time range)    ED Course/ Medical Decision Making/ A&P                             Medical Decision Making Amount and/or Complexity of Data Reviewed Labs: ordered.  Risk Prescription drug management.   Persistent vomiting for the last 2 days superimposed on chronic vomiting.  I have ordered IV fluids, metoclopramide.  I have reviewed his past records, and note normal gastric emptying nuclear medicine and on 04/30/2022.  Upper endoscopy on 08/02/2022 and 09/13/2022 showed findings of esophagitis and sessile polyps and small hiatus hernia.  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  I have reviewed and interpreted his laboratory tests, and my interpretation is leukocytosis which is similar to what he has had in the past, elevated random glucose which has been present in the past, mild hypokalemia which should also been present in the past and is likely related to emesis.  Borderline elevated total bilirubin, lower than it was on 07/28/2022.  Following up noted treatment, he feels better.  He says that metoclopramide has given him better control of his nausea than promethazine suppository.  I have ordered an oral fluid challenge.  He vomited following oral fluid challenge.  I have ordered intravenous ondansetron and haloperidol.  He has noted some improvement in nausea, but is still spitting up small amounts.  He has not  actually vomiting at this point.  Since he is not showing any signs of being dehydrated, I feel it is safe for him to be discharged.  I am discharging him with a prescription for haloperidol and ondansetron oral dissolving tablet.  He has an appointment with his gastroenterologist in 10 days and he is to keep that appointment.  I recommended that he drink small amounts, frequently.  Return to the emergency department if he is not tolerating oral fluids at home.  Final Clinical Impression(s) / ED Diagnoses Final diagnoses:  Nausea and vomiting, unspecified vomiting type  Hypokalemia  Elevated random blood glucose level  Total bilirubin, elevated  Elevated blood pressure reading without diagnosis of hypertension    Rx / DC Orders ED Discharge Orders          Ordered    ondansetron (ZOFRAN-ODT) 8 MG disintegrating tablet  Every 8 hours PRN        10/21/22 0740    haloperidol (HALDOL) 5 MG tablet  Every 6 hours PRN        10/21/22 5643              Delora Fuel, MD 32/95/18 [phone]

## 2022-10-21 NOTE — Discharge Instructions (Signed)
Drink small amounts frequently.  Return to the emergency department if you are unable to hold liquids down.  Follow-up with your gastroenterologist for further outpatient workup and treatment.

## 2022-10-31 ENCOUNTER — Ambulatory Visit (INDEPENDENT_AMBULATORY_CARE_PROVIDER_SITE_OTHER): Payer: Managed Care, Other (non HMO) | Admitting: Internal Medicine

## 2022-10-31 ENCOUNTER — Encounter: Payer: Self-pay | Admitting: Internal Medicine

## 2022-10-31 VITALS — BP 126/72 | HR 85 | Ht 72.0 in | Wt 214.0 lb

## 2022-10-31 DIAGNOSIS — R1115 Cyclical vomiting syndrome unrelated to migraine: Secondary | ICD-10-CM | POA: Diagnosis not present

## 2022-10-31 DIAGNOSIS — R109 Unspecified abdominal pain: Secondary | ICD-10-CM

## 2022-10-31 DIAGNOSIS — K2100 Gastro-esophageal reflux disease with esophagitis, without bleeding: Secondary | ICD-10-CM | POA: Diagnosis not present

## 2022-10-31 MED ORDER — HALOPERIDOL 5 MG PO TABS: 5.0000 mg | ORAL_TABLET | Freq: Four times a day (QID) | ORAL | 1 refills | Status: DC | PRN

## 2022-10-31 NOTE — Telephone Encounter (Signed)
Andrew Castillo: [id number] exp. 10/31/22-11/29/22 for GI

## 2022-10-31 NOTE — Patient Instructions (Signed)
Continue your Haloperidol per Dr Carlean Purl.  Set up your MRI that neurology has ordered.   Due to recent changes in healthcare laws, you may see the results of your imaging and laboratory studies on MyChart before your provider has had a chance to review them.  We understand that in some cases there may be results that are confusing or concerning to you. Not all laboratory results come back in the same time frame and the provider may be waiting for multiple results in order to interpret others.  Please give [place] 48 hours in order for your provider to thoroughly review all the results before contacting the office for clarification of your results.    I appreciate the opportunity to care for you. Silvano Rusk, MD, [facility]

## 2022-10-31 NOTE — Progress Notes (Signed)
Andrew Castillo 27 y.o. [birth year] [id number]  Assessment & Plan:   Encounter Diagnoses  Name Primary?   Persistent vomiting Yes   Gastroesophageal reflux disease with esophagitis without hemorrhage    Cyclic vomiting syndrome ?    Abdominal wall pain     He is improving at this point.  Seems like Haldol helped quite a bit at least with recent issues.  I want him to try to take that as needed as opposed to daily.  It has been such a refractory issue I have refilled that medication at this point but told him because of potential side effects we would like to avoid taking that long-term.  Regarding his question about stress and anxiety influencing his gastrointestinal symptoms I encouraged him to seek counseling as a starting point.  I also encouraged him to proceed with the MRI of the brain that neurology wants him to do regarding his seizure disorder.  Unlikely but possible we could uncover something that is contributing to his nausea and vomiting problems.  He did have fluid retention on his EGD in December but has had a normal gastric emptying study.  Gastric emptying study results can be quite variable so maybe he does have some sort of a motility issue perhaps.  I have considered hiatal hernia repair/fundoplication but I think we need to be very cautious about that until we understand his situation better.  He was reassured about the abdominal wall pain I think that is coming from his episodes of vomiting and instructed on conservative care.   Meds ordered this encounter  Medications   haloperidol (HALDOL) 5 MG tablet    Sig: Take 1 tablet (5 mg total) by mouth every 6 (six) hours as needed (nausea, vomiting).    Dispense:  30 tablet    Refill:  1   12/18/2022 is his follow-up visit with me.  At some point pending clinical course need to consider repeat EGD to document healing of his esophagitis.     Subjective:  GI summary:  Persistent vomiting problems cause not clear,  normal gastric emptying study 2023, GERD, cannabis stopped no effect, variable response to ondansetron, Reglan, promethazine/prochlorperazine suppositories.  Haldol has helped.   GERD with esophagitis and 3 cm hiatal hernia, grade B 2017, grade C November 2023 (after coffee-ground emesis)-reactive gastropathy on stomach biopsies, grade B September 13, 2022   Chief Complaint: Vomiting and reflux problems  HPI 27 year old man with a history of chronic persistent vomiting and GERD, etiology not entirely clear.  He could have some variant of cyclic vomiting syndrome, we do not think this is cannabis hyperemesis syndrome.  He has improved with time using twice daily PPI and sucralfate and intermittent antiemetics particularly promethazine versus prochlorperazine suppositories to try to break episodes.  He had another severe episode of vomiting and went to the emergency department earlier this month (notes reviewed) and he was treated with Haldol which she said was very helpful.  5 mg every 6 hours as needed, this was taken about 4 times a day initially and he said it was quite helpful and now he is taking it about once a day.  He notices that it really helps with postprandial nausea and vomiting urges.  He was going through a period of stress when this occurred and wonders if there is an influence.  There was some issues with trying to sort out his home voice.  He is back to work.  I last saw him an EGD on  09/13/2022.  It demonstrated grade B esophagitis, 3 cm hiatal hernia and some food residue in the gastric body.  2 diminutive polyps in the fundus and body not biopsied.    He is having some upper abdominal pains and wonders if it is related to his hernia.  He went through an insurance change and that is why he has not had his MRI as recommended by neurology plans to schedule that.  Allergies  Allergen Reactions   Penicillins Hives    Did it involve swelling of the face/tongue/throat, SOB, or low BP?  Unknown Did it involve sudden or severe rash/hives, skin peeling, or any reaction on the inside of your mouth or nose? Unknown Did you need to seek medical attention at a hospital or doctor's office? Unknown When did it last happen?   Pt doesn't remember he said he was a child    If all above answers are "NO", may proceed with cephalosporin use.    Flexeril [Cyclobenzaprine]     seizures   Current Meds  Medication Sig   diazePAM, 15 MG Dose, (VALTOCO 15 MG DOSE) 2 x 7.5 MG/0.1ML LQPK Place 15 mg into the nose as needed (For seizure lasting more than 5 minutes or for seizure clusters).   dicyclomine (BENTYL) 10 MG capsule Take 1 capsule (10 mg total) by mouth 4 (four) times daily -  before meals and at bedtime. (Patient taking differently: Take 10 mg by mouth daily as needed for spasms.)   fluticasone (FLONASE) 50 MCG/ACT nasal spray SPRAY 2 SPRAYS INTO EACH NOSTRIL EVERY DAY (Patient taking differently: Place 2 sprays into both nostrils daily as needed for allergies.)   levocetirizine (XYZAL) 5 MG tablet TAKE 1 TABLET BY MOUTH EVERY DAY IN THE EVENING (Patient taking differently: Take 5 mg by mouth daily as needed for allergies.)   ondansetron (ZOFRAN) 8 MG tablet Take 1 tablet (8 mg total) by mouth every 8 (eight) hours as needed for nausea or vomiting.   ondansetron (ZOFRAN-ODT) 8 MG disintegrating tablet Take 1 tablet (8 mg total) by mouth every 8 (eight) hours as needed for nausea or vomiting.   pantoprazole (PROTONIX) 40 MG tablet Take 1 tablet (40 mg total) by mouth 2 (two) times daily before a meal.   prochlorperazine (COMPAZINE) 25 MG suppository Place 1 suppository (25 mg total) rectally every 12 (twelve) hours as needed for nausea or vomiting.   promethazine (PHENERGAN) 25 MG suppository Place 1 suppository (25 mg total) rectally every 6 (six) hours as needed for nausea or vomiting.   sucralfate (CARAFATE) 1 GM/10ML suspension Take 10 mLs (1 g total) by mouth 4 (four) times daily -   with meals and at bedtime.   [DISCONTINUED] haloperidol (HALDOL) 5 MG tablet Take 1 tablet (5 mg total) by mouth every 6 (six) hours as needed (nausea, vomiting).   Past Medical History:  Diagnosis Date   Childhood asthma    Environmental allergies    GERD (gastroesophageal reflux disease)    Headache    migraines   Seasonal allergic conjunctivitis    Seizures ([place])    caused by flexiril 01/2022   Past Surgical History:  Procedure Laterality Date   ESOPHAGOGASTRODUODENOSCOPY     Left Knee Menuiscus     "several surguries on same knee"   NM ESOPHAGEAL REFLUX     Right Shoulder Surgery     TONSILLECTOMY     adenoids   UPPER GI ENDOSCOPY     Social History   Social History  Narrative   Single-engaged, 1 child lives with fianc   Marijuana user Cigarette smoker   no current alcohol no other drugs   He is an Therapist, art   family history includes ADD / ADHD in his brother; Anxiety disorder in his maternal aunt, mother, and sister; Bone cancer in his paternal grandmother; Clotting disorder in his mother; Crohn's disease in his mother; Depression in his maternal aunt; Diabetes in his maternal grandmother, paternal aunt, and paternal grandfather; Drug abuse in his paternal uncle; Fibromyalgia in his mother and paternal aunt; Heart disease in his paternal grandfather; Hyperlipidemia in his father; Hypertension in his father and maternal grandmother; Liver disease in his father and maternal grandmother; Lupus in his maternal aunt, mother, and paternal aunt; Migraines in an other family member; Seizures in his brother; Thyroid disease in his mother.   Review of Systems as per HPI  Objective:   Physical Exam BP 126/72   Pulse 85   Ht 6' (1.829 m)   Wt 214 lb (97.1 kg)   BMI 29.02 kg/m  NAD Abdomen - mildly + carnetts epigastric/RUQ and LUQ - no hernia

## 2022-11-14 ENCOUNTER — Ambulatory Visit
Admission: RE | Admit: 2022-11-14 | Discharge: 2022-11-14 | Disposition: A | Discharge status: Home / Self Care | Payer: BC Managed Care – PPO | Source: Ambulatory Visit | Attending: Neurology | Admitting: Neurology

## 2022-11-14 DIAGNOSIS — G40909 Epilepsy, unspecified, not intractable, without status epilepticus: Secondary | ICD-10-CM | POA: Diagnosis not present

## 2022-11-14 DIAGNOSIS — G40901 Epilepsy, unspecified, not intractable, with status epilepticus: Secondary | ICD-10-CM | POA: Diagnosis not present

## 2022-11-14 MED ORDER — GADOPICLENOL 0.5 MMOL/ML IV SOLN
8.0000 mL | Freq: Once | INTRAVENOUS | Status: AC | PRN
  Administered 2022-11-14: 8 mL via INTRAVENOUS

## 2022-11-19 ENCOUNTER — Telehealth: Payer: Self-pay | Admitting: Neurology

## 2022-11-19 ENCOUNTER — Emergency Department (HOSPITAL_COMMUNITY): Payer: BC Managed Care – PPO

## 2022-11-19 ENCOUNTER — Encounter (HOSPITAL_COMMUNITY): Payer: Self-pay

## 2022-11-19 ENCOUNTER — Other Ambulatory Visit: Payer: Self-pay

## 2022-11-19 ENCOUNTER — Emergency Department (HOSPITAL_COMMUNITY)
Admission: EM | Admit: 2022-11-19 | Discharge: 2022-11-19 | Disposition: A | Discharge status: Home / Self Care | Payer: BC Managed Care – PPO | Attending: Emergency Medicine | Admitting: Emergency Medicine

## 2022-11-19 DIAGNOSIS — R519 Headache, unspecified: Secondary | ICD-10-CM | POA: Diagnosis not present

## 2022-11-19 DIAGNOSIS — R55 Syncope and collapse: Secondary | ICD-10-CM

## 2022-11-19 LAB — CBC WITH DIFFERENTIAL/PLATELET
Abs Immature Granulocytes: 0.04 10*3/uL (ref 0.00–0.07)
Basophils Absolute: 0 10*3/uL (ref 0.0–0.1)
Basophils Relative: 0 %
Eosinophils Absolute: 0.1 10*3/uL (ref 0.0–0.5)
Eosinophils Relative: 1 %
HCT: 47.9 % (ref 39.0–52.0)
Hemoglobin: 15.8 g/dL (ref 13.0–17.0)
Immature Granulocytes: 1 %
Lymphocytes Relative: 23 %
Lymphs Abs: 2 10*3/uL (ref 0.7–4.0)
MCH: 29.2 pg (ref 26.0–34.0)
MCHC: 33 g/dL (ref 30.0–36.0)
MCV: 88.5 fL (ref 80.0–100.0)
Monocytes Absolute: 0.7 10*3/uL (ref 0.1–1.0)
Monocytes Relative: 8 %
Neutro Abs: 5.9 10*3/uL (ref 1.7–7.7)
Neutrophils Relative %: 67 %
Platelets: 291 10*3/uL (ref 150–400)
RBC: 5.41 MIL/uL (ref 4.22–5.81)
RDW: 12.8 % (ref 11.5–15.5)
WBC: 8.7 10*3/uL (ref 4.0–10.5)
nRBC: 0 % (ref 0.0–0.2)

## 2022-11-19 LAB — BASIC METABOLIC PANEL
Anion gap: 7 (ref 5–15)
BUN: 10 mg/dL (ref 6–20)
CO2: 23 mmol/L (ref 22–32)
Calcium: 9.1 mg/dL (ref 8.9–10.3)
Chloride: 107 mmol/L (ref 98–111)
Creatinine, Ser: 0.94 mg/dL (ref 0.61–1.24)
GFR, Estimated: >60 mL/min (ref >60)
Glucose, Bld: 113 mg/dL — ABNORMAL HIGH (ref 70–99)
Potassium: 3.6 mmol/L (ref 3.5–5.1)
Sodium: 137 mmol/L (ref 135–145)

## 2022-11-19 MED ORDER — LACTATED RINGERS IV BOLUS
2000.0000 mL | Freq: Once | INTRAVENOUS | Status: AC
  Administered 2022-11-19: 2000 mL via INTRAVENOUS

## 2022-11-19 NOTE — Group Note (Unsigned)
Date:  11/19/2022 Time:  10:23 AM  Group Topic/Focus:  Goals Group:   The focus of this group is to help patients establish daily goals to achieve during treatment and discuss how the patient can incorporate goal setting into their daily lives to aide in recovery. Orientation:   The focus of this group is to educate the patient on the purpose and policies of crisis stabilization and provide a format to answer questions about their admission.  The group details unit policies and expectations of patients while admitted.     Participation Level:  {BHH PARTICIPATION HD:[id number]  Participation Quality:  {BHH PARTICIPATION QUALITY:22265}  Affect:  {BHH AFFECT:22266}  Cognitive:  {BHH COGNITIVE:22267}  Insight: {BHH Insight2:20797}  Engagement in Group:  {BHH ENGAGEMENT IN JY:[id number]  Modes of Intervention:  {BHH MODES OF INTERVENTION:22269}  Additional Comments:  ***  Dub Mikes 11/19/2022, 10:23 AM

## 2022-11-19 NOTE — ED Provider Notes (Signed)
[facility] Provider Note   CSN: [id number] Arrival date & time: 11/19/22  [id number]     History  Chief complaint: Syncope  Andrew Castillo is a 27 y.o. male.  HPI   Patient has a history of seizure-like activity, reflux, anxiety who presents to the ED for evaluation of headache and a syncopal episode.  Patient states he was driving to work today.  He started to feel anxious.  His vision started to become blurry.  He felt like he was having a panic attack.  He drove to his sister's house.  He was able to walk up the stairs into the house and patient then had a syncopal episode.  He is having a headache now.  It is severe.  He is not sure if he injured himself.  He is not having any pain other than his head.  He does have a history of recurrent and chronic nausea and vomiting.  He was vomiting yesterday.  He denies any abdominal pain.  No chest pain or shortness of breath.  No fevers.  No neck pain.  Home Medications Prior to Admission medications   Medication Sig Start Date End Date Taking? Authorizing Provider  diazePAM, 15 MG Dose, (VALTOCO 15 MG DOSE) 2 x 7.5 MG/0.1ML LQPK Place 15 mg into the nose as needed (For seizure lasting more than 5 minutes or for seizure clusters). 09/27/22   Alric Ran, MD  dicyclomine (BENTYL) 10 MG capsule Take 1 capsule (10 mg total) by mouth 4 (four) times daily -  before meals and at bedtime. Patient taking differently: Take 10 mg by mouth daily as needed for spasms. 04/05/22   Willia Craze, NP  fluticasone (FLONASE) 50 MCG/ACT nasal spray SPRAY 2 SPRAYS INTO EACH NOSTRIL EVERY DAY Patient taking differently: Place 2 sprays into both nostrils daily as needed for allergies. 08/22/22   [name], [name], NP-C  haloperidol (HALDOL) 5 MG tablet Take 1 tablet (5 mg total) by mouth every 6 (six) hours as needed (nausea, vomiting). 10/31/22   Gatha Mayer, MD  levocetirizine (XYZAL) 5 MG tablet TAKE 1 TABLET BY  MOUTH EVERY DAY IN THE EVENING Patient taking differently: Take 5 mg by mouth daily as needed for allergies. 08/22/22   [name], [name], NP-C  ondansetron (ZOFRAN) 8 MG tablet Take 1 tablet (8 mg total) by mouth every 8 (eight) hours as needed for nausea or vomiting. 10/21/22   Pattricia Boss, MD  ondansetron (ZOFRAN-ODT) 8 MG disintegrating tablet Take 1 tablet (8 mg total) by mouth every 8 (eight) hours as needed for nausea or vomiting. 0000000   Delora Fuel, MD  pantoprazole (PROTONIX) 40 MG tablet Take 1 tablet (40 mg total) by mouth 2 (two) times daily before a meal. 08/02/22   Cirigliano, Vito V, DO  prochlorperazine (COMPAZINE) 25 MG suppository Place 1 suppository (25 mg total) rectally every 12 (twelve) hours as needed for nausea or vomiting. 10/21/22   Pattricia Boss, MD  promethazine (PHENERGAN) 25 MG suppository Place 1 suppository (25 mg total) rectally every 6 (six) hours as needed for nausea or vomiting. 08/02/22   Cirigliano, Vito V, DO  sucralfate (CARAFATE) 1 GM/10ML suspension Take 10 mLs (1 g total) by mouth 4 (four) times daily -  with meals and at bedtime. 09/13/22 01/11/23  Gatha Mayer, MD      Allergies    Penicillins and Flexeril [cyclobenzaprine]    Review of Systems   Review of  Systems  Physical Exam Updated Vital Signs BP (!) 143/73   Pulse 60   Temp 98.2 F (36.8 C) (Oral)   Resp 19   Ht 1.829 m (6')   Wt 93 kg   SpO2 100%   BMI 27.80 kg/m  Physical Exam Vitals and nursing note reviewed.  Constitutional:      Appearance: He is well-developed. He is not diaphoretic.  HENT:     Head: Normocephalic and atraumatic.     Right Ear: External ear normal.     Left Ear: External ear normal.     Mouth/Throat:     Mouth: Mucous membranes are dry.  Eyes:     General: No scleral icterus.       Right eye: No discharge.        Left eye: No discharge.     Conjunctiva/sclera: Conjunctivae normal.  Neck:     Trachea: No tracheal deviation.  Cardiovascular:      Rate and Rhythm: Normal rate and regular rhythm.  Pulmonary:     Effort: Pulmonary effort is normal. No respiratory distress.     Breath sounds: Normal breath sounds. No stridor. No wheezing or rales.  Abdominal:     General: Bowel sounds are normal. There is no distension.     Palpations: Abdomen is soft.     Tenderness: There is no abdominal tenderness. There is no guarding or rebound.  Musculoskeletal:        General: No tenderness or deformity.     Cervical back: Neck supple.  Skin:    General: Skin is warm and dry.     Findings: No rash.  Neurological:     General: No focal deficit present.     Mental Status: He is alert.     Cranial Nerves: No cranial nerve deficit, dysarthria or facial asymmetry.     Sensory: No sensory deficit.     Motor: No abnormal muscle tone or seizure activity.     Coordination: Coordination normal.  Psychiatric:        Mood and Affect: Mood normal.     ED Results / Procedures / Treatments   Labs (all labs ordered are listed, but only abnormal results are displayed) Labs Reviewed  BASIC METABOLIC PANEL - Abnormal; Notable for the following components:      Result Value   Glucose, Bld 113 (*)    All other components within normal limits  CBC WITH DIFFERENTIAL/PLATELET    EKG EKG Interpretation  Date/Time:  Monday November 19 2022 07:59:57 EST Ventricular Rate:  74 PR Interval:  133 QRS Duration: 94 QT Interval:  423 QTC Calculation: 470 R Axis:   47 Text Interpretation: Sinus rhythm Borderline prolonged QT interval No significant change since last tracing Confirmed by Dorie Rank [phone]) on 11/19/2022 8:18:03 AM  Radiology CT Head Wo Contrast  Result Date: 11/19/2022 CLINICAL DATA:  Headache EXAM: CT HEAD WITHOUT CONTRAST TECHNIQUE: Contiguous axial images were obtained from the base of the skull through the vertex without intravenous contrast. RADIATION DOSE REDUCTION: This exam was performed according to the departmental dose-optimization  program which includes automated exposure control, adjustment of the mA and/or kV according to patient size and/or use of iterative reconstruction technique. COMPARISON:  None Available. FINDINGS: Brain: No evidence of acute infarction, hemorrhage, hydrocephalus, extra-axial collection or mass lesion/mass effect. Vascular: No hyperdense vessel or unexpected calcification. Skull: Normal. Negative for fracture or focal lesion. Sinuses/Orbits: Middle ear or mastoid effusion. Pansinus mucosal thickening, greatest in the left  frontal sinus. Visualized orbits are unremarkable. Other: None. IMPRESSION: 1. No acute intracranial abnormality. 2. Pansinus mucosal thickening. Electronically Signed   By: Marin Roberts M.D.   On: 11/19/2022 08:46    Procedures Procedures    Medications Ordered in ED Medications  lactated ringers bolus 2,000 mL (2,000 mLs Intravenous New Bag/Given 11/19/22 0800)    ED Course/ Medical Decision Making/ A&P Clinical Course as of 11/19/22 1042  Mon Nov 19, 2022  123456 Basic metabolic panel(!) CBC and metabolic panel without acute abnormalities [JK]  1032 CBC normal [JK]  1032 Head CT without acute abnormalities.  Sinus thickening noted [JK]    Clinical Course User Index [JK] Dorie Rank, MD                             Medical Decision Making Problems Addressed: Syncope and collapse: acute illness or injury that poses a threat to life or bodily functions  Amount and/or Complexity of Data Reviewed Labs: ordered. Decision-making details documented in ED Course. Radiology: ordered.   Patient presented to the ED for evaluation near syncopal episode.  Patient does have history of GERD and recurrent vomiting.  He thought maybe he could be dehydrated but also felt he could be having an anxiety attack.  Patient also has history of seizures but no reported seizure.  ED workup reassuring.  Patient does not have any focal neurologic deficits.  He was complaining of a bad headache so a  CT scan was performed and it does not show any evidence of hemorrhage.  Patient is afebrile has no neck stiffness.  I doubt meningitis.  No evidence of dehydration or anemia.  Patient was monitored in the ED and no recurrent symptoms.          Final Clinical Impression(s) / ED Diagnoses Final diagnoses:  Syncope and collapse    Rx / DC Orders ED Discharge Orders     None         Dorie Rank, MD 11/19/22 1042

## 2022-11-19 NOTE — Discharge Instructions (Signed)
Continue your current medications.  Try to stay well-hydrated.  Follow up with your doctor later this week to be rechecked if your symptoms have not completely resolved.  Return for recurrent episodes

## 2022-11-19 NOTE — ED Triage Notes (Addendum)
Patient said he has been feeling anxious and took Haldol last night. Was vomiting last night. Today he went to his sisters house and could not catch his breath, thought he had a panic attack and passed out. He is complaining of a headache. He does not know if he hit his head. Not taking a blood thinner.

## 2022-11-19 NOTE — Telephone Encounter (Signed)
Nothing further, for now make sure you take the Aptiom everyday. At last visit, He was taking the medication inconsistently.  Please have him follow up with PCP for management of anxiety

## 2022-11-19 NOTE — Telephone Encounter (Signed)
Called and spoke to patient, he reports having a 15 second seizure following what he believes is a panic attack. He states that he has been having severe anxiety with shortness of breath, palpitations and blurry vision. States he was seen in ER following and had a CT scan that was clear. Wanted to know what additional recommendations Dr. April Manson had.

## 2022-11-19 NOTE — ED Notes (Signed)
ED Provider at bedside. 

## 2022-11-19 NOTE — ED Notes (Signed)
Patient states that he was driving on his way to work when he suddenly become SOB and get anxious because he couldn't find his inhaler in his car. He became dizzy but was able to drive to his sisters house where he had a LOC. Sister found him laying on the bed on his stomach. No loss of bowel/bladder, unknown how long he was down for.

## 2022-11-19 NOTE — Telephone Encounter (Signed)
Pt called and stated he had a 30 -45 minute seziure. Stated he followed up at the ER. Pt is requesting a call back from nurse.

## 2022-11-19 NOTE — ED Notes (Signed)
Bloodwork sent to lab. Pt laying on stretcher with visitor at bedside. Pt on cm sp02 and nibp, call bell in reach.

## 2022-11-20 ENCOUNTER — Encounter: Payer: Self-pay | Admitting: Family Medicine

## 2022-11-20 ENCOUNTER — Other Ambulatory Visit: Payer: Self-pay | Admitting: Neurology

## 2022-11-20 ENCOUNTER — Telehealth: Payer: Self-pay

## 2022-11-20 MED ORDER — VALTOCO 15 MG DOSE 7.5 MG/0.1ML NA LQPK: 15.0000 mg | NASAL | 2 refills | Status: DC | PRN

## 2022-11-20 NOTE — Transitions of Care (Post Inpatient/ED Visit) (Signed)
   11/20/2022  Name: Andrew Castillo MRN: [id number] DOB: 1996/08/06  Today's TOC FU Call Status: Today's TOC FU Call Status:: Successful TOC FU Call Competed TOC FU Call Complete Date: 11/20/22  Transition Care Management Follow-up Telephone Call Date of Discharge: 11/19/22 Discharge Facility: [facility]) Type of Discharge: Emergency Department Reason for ED Visit: Other: (syncope) How have you been since you were released from the hospital?: Better Any questions or concerns?: No  Items Reviewed: Did you receive and understand the discharge instructions provided?: Yes Medications obtained and verified?: Yes (Medications Reviewed) Any new allergies since your discharge?: No Dietary orders reviewed?: Yes Do you have support at home?: Yes People in Home: significant other  Home Care and Equipment/Supplies: [place] Ordered?: NA Any new equipment or medical supplies ordered?: NA  Functional Questionnaire: Do you need assistance with bathing/showering or dressing?: No Do you need assistance with meal preparation?: No Do you need assistance with eating?: No Do you have difficulty maintaining continence: No Do you need assistance with getting out of bed/getting out of a chair/moving?: No Do you have difficulty managing or taking your medications?: No  Folllow up appointments reviewed: PCP Follow-up appointment confirmed?: Yes Date of PCP follow-up appointment?: 11/23/22 Follow-up Provider: [facility] Follow-up appointment confirmed?: NA Do you need transportation to your follow-up appointment?: No Do you understand care options if your condition(s) worsen?: Yes-patient verbalized understanding    [place], [place] Direct Dial [phone]

## 2022-11-20 NOTE — Telephone Encounter (Signed)
Refills sent to pharmacy. 

## 2022-11-23 ENCOUNTER — Inpatient Hospital Stay: Payer: BC Managed Care – PPO | Admitting: Family Medicine

## 2022-11-23 DIAGNOSIS — R112 Nausea with vomiting, unspecified: Secondary | ICD-10-CM | POA: Diagnosis not present

## 2022-11-23 DIAGNOSIS — R1110 Vomiting, unspecified: Secondary | ICD-10-CM | POA: Diagnosis not present

## 2022-11-25 ENCOUNTER — Other Ambulatory Visit: Payer: Self-pay

## 2022-11-25 ENCOUNTER — Emergency Department (HOSPITAL_COMMUNITY)
Admission: EM | Admit: 2022-11-25 | Discharge: 2022-11-25 | Disposition: A | Discharge status: Home / Self Care | Payer: BC Managed Care – PPO | Attending: Emergency Medicine | Admitting: Emergency Medicine

## 2022-11-25 ENCOUNTER — Encounter: Payer: Self-pay | Admitting: Neurology

## 2022-11-25 DIAGNOSIS — Y900 Blood alcohol level of less than 20 mg/100 ml: Secondary | ICD-10-CM | POA: Diagnosis not present

## 2022-11-25 DIAGNOSIS — F419 Anxiety disorder, unspecified: Secondary | ICD-10-CM | POA: Diagnosis not present

## 2022-11-25 DIAGNOSIS — H538 Other visual disturbances: Secondary | ICD-10-CM | POA: Insufficient documentation

## 2022-11-25 DIAGNOSIS — F1410 Cocaine abuse, uncomplicated: Secondary | ICD-10-CM | POA: Insufficient documentation

## 2022-11-25 DIAGNOSIS — F1210 Cannabis abuse, uncomplicated: Secondary | ICD-10-CM | POA: Diagnosis not present

## 2022-11-25 DIAGNOSIS — R9431 Abnormal electrocardiogram [ECG] [EKG]: Secondary | ICD-10-CM | POA: Diagnosis not present

## 2022-11-25 DIAGNOSIS — H5789 Other specified disorders of eye and adnexa: Secondary | ICD-10-CM | POA: Diagnosis not present

## 2022-11-25 DIAGNOSIS — H539 Unspecified visual disturbance: Secondary | ICD-10-CM | POA: Diagnosis not present

## 2022-11-25 DIAGNOSIS — D72829 Elevated white blood cell count, unspecified: Secondary | ICD-10-CM | POA: Insufficient documentation

## 2022-11-25 LAB — CBC WITH DIFFERENTIAL/PLATELET
Abs Immature Granulocytes: 0.05 10*3/uL (ref 0.00–0.07)
Basophils Absolute: 0 10*3/uL (ref 0.0–0.1)
Basophils Relative: 0 %
Eosinophils Absolute: 0 10*3/uL (ref 0.0–0.5)
Eosinophils Relative: 0 %
HCT: 48.5 % (ref 39.0–52.0)
Hemoglobin: 16.1 g/dL (ref 13.0–17.0)
Immature Granulocytes: 0 %
Lymphocytes Relative: 19 %
Lymphs Abs: 2.2 10*3/uL (ref 0.7–4.0)
MCH: 29.6 pg (ref 26.0–34.0)
MCHC: 33.2 g/dL (ref 30.0–36.0)
MCV: 89.2 fL (ref 80.0–100.0)
Monocytes Absolute: 0.8 10*3/uL (ref 0.1–1.0)
Monocytes Relative: 7 %
Neutro Abs: 8.7 10*3/uL — ABNORMAL HIGH (ref 1.7–7.7)
Neutrophils Relative %: 74 %
Platelets: 290 10*3/uL (ref 150–400)
RBC: 5.44 MIL/uL (ref 4.22–5.81)
RDW: 13 % (ref 11.5–15.5)
WBC: 11.7 10*3/uL — ABNORMAL HIGH (ref 4.0–10.5)
nRBC: 0 % (ref 0.0–0.2)

## 2022-11-25 LAB — RAPID URINE DRUG SCREEN, HOSP PERFORMED
Amphetamines: NOT DETECTED
Barbiturates: NOT DETECTED
Benzodiazepines: NOT DETECTED
Cocaine: POSITIVE — AB
Opiates: NOT DETECTED
Tetrahydrocannabinol: POSITIVE — AB

## 2022-11-25 LAB — COMPREHENSIVE METABOLIC PANEL
ALT: 18 U/L (ref 0–44)
AST: 17 U/L (ref 15–41)
Albumin: 4.5 g/dL (ref 3.5–5.0)
Alkaline Phosphatase: 54 U/L (ref 38–126)
Anion gap: 10 (ref 5–15)
BUN: 15 mg/dL (ref 6–20)
CO2: 24 mmol/L (ref 22–32)
Calcium: 9.2 mg/dL (ref 8.9–10.3)
Chloride: 103 mmol/L (ref 98–111)
Creatinine, Ser: 0.98 mg/dL (ref 0.61–1.24)
GFR, Estimated: >60 mL/min (ref >60)
Glucose, Bld: 105 mg/dL — ABNORMAL HIGH (ref 70–99)
Potassium: 4 mmol/L (ref 3.5–5.1)
Sodium: 137 mmol/L (ref 135–145)
Total Bilirubin: 1 mg/dL (ref 0.3–1.2)
Total Protein: 7 g/dL (ref 6.5–8.1)

## 2022-11-25 LAB — ACETAMINOPHEN LEVEL: Acetaminophen (Tylenol), Serum: <10 ug/mL — ABNORMAL LOW (ref 10–30)

## 2022-11-25 LAB — ETHANOL: Alcohol, Ethyl (B): <10 mg/dL (ref <10)

## 2022-11-25 LAB — CBG MONITORING, ED: Glucose-Capillary: 125 mg/dL — ABNORMAL HIGH (ref 70–99)

## 2022-11-25 LAB — SALICYLATE LEVEL: Salicylate Lvl: <7 mg/dL — ABNORMAL LOW (ref 7.0–30.0)

## 2022-11-25 MED ORDER — LORAZEPAM 0.5 MG PO TABS
0.5000 mg | ORAL_TABLET | Freq: Once | ORAL | Status: AC
  Administered 2022-11-25: 0.5 mg via ORAL
  Filled 2022-11-25: qty 1

## 2022-11-25 MED ORDER — HYDROXYZINE HCL 25 MG PO TABS: 25.0000 mg | ORAL_TABLET | Freq: Four times a day (QID) | ORAL | 0 refills | Status: AC

## 2022-11-25 NOTE — ED Triage Notes (Signed)
Pt states has been having blurry vision x 2 days, had syncopal episode that same day. Pt states he was seen for same on Friday.

## 2022-11-25 NOTE — ED Notes (Signed)
Attempted to get a urine sample but the patient stated he was unable to produce
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
## 2022-11-25 NOTE — Discharge Instructions (Signed)
At this time there does not appear to be the presence of an emergent medical condition, however there is always the potential for conditions to change. Please read and follow the below instructions.  Please return to the Emergency Department immediately for any new or worsening symptoms . Please be sure to follow up with your Primary Care Provider within one week regarding your visit today; please call their office to schedule an appointment even if you are feeling better for a follow-up visit. Please go to the behavioral health urgent care after you leave the emergency department today for further treatment of your anxiety.  Please also go to your primary care doctor's appointment on Friday for recheck. You may use the medication hydroxyzine as prescribed to help with anxiety.  This medication will make you drowsy so do not drive, drink alcohol or perform any dangerous activities while taking the medication. You may call the on-call eye doctor Dr. Manuella Ghazi for a checkup in regards to your blurry vision.   Please read the additional information packets attached to your discharge summary.  Go to the nearest Emergency Department immediately if: You have fever or chills You have headache, nausea or vomiting You have chest pain or difficulty breathing You have numbness or weakness You have changes to your vision  Get help right away if you feel like you may hurt yourself or others, or have thoughts about taking your own life. Go to your nearest emergency room or: Call 911. Call the [facility] at [phone] or 988. This is open 24 hours a day. Text the Crisis Text Line at [phone].  Do not take your medicine if  develop an itchy rash, swelling in your mouth or lips, or difficulty breathing; call 911 and seek immediate emergency medical attention if this occurs.  You may review your lab tests and imaging results in their entirety on your MyChart account.  Please discuss all  results of fully with your primary care provider and other specialist at your follow-up visit.  Note: Portions of this text may have been transcribed using voice recognition software. Every effort was made to ensure accuracy; however, inadvertent computerized transcription errors may still be present.

## 2022-11-29 ENCOUNTER — Other Ambulatory Visit: Payer: Self-pay

## 2022-11-29 ENCOUNTER — Encounter (HOSPITAL_COMMUNITY): Payer: Self-pay

## 2022-11-29 ENCOUNTER — Emergency Department (HOSPITAL_COMMUNITY)
Admission: EM | Admit: 2022-11-29 | Discharge: 2022-11-29 | Disposition: A | Discharge status: Home / Self Care | Payer: BC Managed Care – PPO | Attending: Emergency Medicine | Admitting: Emergency Medicine

## 2022-11-29 DIAGNOSIS — R000 Tachycardia, unspecified: Secondary | ICD-10-CM | POA: Diagnosis not present

## 2022-11-29 DIAGNOSIS — I10 Essential (primary) hypertension: Secondary | ICD-10-CM | POA: Diagnosis not present

## 2022-11-29 DIAGNOSIS — R569 Unspecified convulsions: Secondary | ICD-10-CM | POA: Insufficient documentation

## 2022-11-29 DIAGNOSIS — G4489 Other headache syndrome: Secondary | ICD-10-CM | POA: Diagnosis not present

## 2022-11-29 LAB — CBC WITH DIFFERENTIAL/PLATELET
Abs Immature Granulocytes: 0.05 10*3/uL (ref 0.00–0.07)
Basophils Absolute: 0 10*3/uL (ref 0.0–0.1)
Basophils Relative: 0 %
Eosinophils Absolute: 0.1 10*3/uL (ref 0.0–0.5)
Eosinophils Relative: 1 %
HCT: 44.7 % (ref 39.0–52.0)
Hemoglobin: 14.9 g/dL (ref 13.0–17.0)
Immature Granulocytes: 0 %
Lymphocytes Relative: 12 %
Lymphs Abs: 1.4 10*3/uL (ref 0.7–4.0)
MCH: 29.6 pg (ref 26.0–34.0)
MCHC: 33.3 g/dL (ref 30.0–36.0)
MCV: 88.9 fL (ref 80.0–100.0)
Monocytes Absolute: 0.7 10*3/uL (ref 0.1–1.0)
Monocytes Relative: 6 %
Neutro Abs: 9.2 10*3/uL — ABNORMAL HIGH (ref 1.7–7.7)
Neutrophils Relative %: 81 %
Platelets: 250 10*3/uL (ref 150–400)
RBC: 5.03 MIL/uL (ref 4.22–5.81)
RDW: 12.8 % (ref 11.5–15.5)
WBC: 11.4 10*3/uL — ABNORMAL HIGH (ref 4.0–10.5)
nRBC: 0 % (ref 0.0–0.2)

## 2022-11-29 LAB — BASIC METABOLIC PANEL
Anion gap: 13 (ref 5–15)
BUN: 10 mg/dL (ref 6–20)
CO2: 22 mmol/L (ref 22–32)
Calcium: 9.1 mg/dL (ref 8.9–10.3)
Chloride: 104 mmol/L (ref 98–111)
Creatinine, Ser: 1.24 mg/dL (ref 0.61–1.24)
GFR, Estimated: >60 mL/min (ref >60)
Glucose, Bld: 89 mg/dL (ref 70–99)
Potassium: 4.4 mmol/L (ref 3.5–5.1)
Sodium: 139 mmol/L (ref 135–145)

## 2022-11-29 LAB — CBG MONITORING, ED: Glucose-Capillary: 96 mg/dL (ref 70–99)

## 2022-11-29 MED ORDER — MIDAZOLAM HCL 2 MG/2ML IJ SOLN
2.0000 mg | Freq: Once | INTRAMUSCULAR | Status: AC
  Administered 2022-11-29: 2 mg via INTRAVENOUS
  Filled 2022-11-29: qty 2

## 2022-11-29 MED ORDER — KETOROLAC TROMETHAMINE 15 MG/ML IJ SOLN
15.0000 mg | Freq: Once | INTRAMUSCULAR | Status: AC
  Administered 2022-11-29: 15 mg via INTRAVENOUS
  Filled 2022-11-29: qty 1

## 2022-11-29 NOTE — ED Triage Notes (Signed)
BIBA from a baseball field, where he had a seizure that lasted approx. 4 minutes. History of seizure, has been taking medication- recently started taking hydoxizine for anxiety. 120/80 BP 114 HR 98% r/a 83 cbg '4mg'$  Zofran given en route

## 2022-11-29 NOTE — ED Provider Notes (Signed)
[facility] Provider Note   CSN: [id number] Arrival date & time: 11/29/22  1949     History Chief Complaint  Patient presents with   Seizures    HPI JAYMIE HAENER is a 27 y.o. male presenting for chief complaint of seizure episode.  He is a 27 year old male with an extensive medical history.  Endorses substance use disorder, seizures on antiepileptics.  States that over the last 4 weeks he has had multiple breakthrough seizures.  Tonight happened while he was at a baseball game. Denies fevers chills nausea vomiting shortness of breath.  States that he has stopped using any substances over the last week due to concern for his breakthrough seizures.  States that he called his neurologist but was not set up for follow-up until July.  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  Patient's recorded medical, surgical, social, medication list and allergies were reviewed in the Snapshot window as part of the initial history.   Review of Systems   Review of Systems  Constitutional:  Negative for chills and fever.  HENT:  Negative for ear pain and sore throat.   Eyes:  Negative for pain and visual disturbance.  Respiratory:  Negative for cough and shortness of breath.   Cardiovascular:  Negative for chest pain and palpitations.  Gastrointestinal:  Negative for abdominal pain and vomiting.  Genitourinary:  Negative for dysuria and hematuria.  Musculoskeletal:  Negative for arthralgias and back pain.  Skin:  Negative for color change and rash.  Neurological:  Positive for seizures. Negative for syncope.  All other systems reviewed and are negative.   Physical Exam Updated Vital Signs BP 131/79   Pulse 79   Temp 98.4 F (36.9 C) (Oral)   Resp 15   Ht 6' (1.829 m)   Wt 95.3 kg   SpO2 98%   BMI 28.49 kg/m  Physical Exam Vitals and nursing note reviewed.  Constitutional:      General: He is not in acute  distress.    Appearance: He is well-developed.  HENT:     Head: Normocephalic and atraumatic.  Eyes:     Conjunctiva/sclera: Conjunctivae normal.  Cardiovascular:     Rate and Rhythm: Normal rate and regular rhythm.     Heart sounds: No murmur heard. Pulmonary:     Effort: Pulmonary effort is normal. No respiratory distress.     Breath sounds: Normal breath sounds.  Abdominal:     Palpations: Abdomen is soft.     Tenderness: There is no abdominal tenderness.  Musculoskeletal:        General: No swelling.     Cervical back: Neck supple.  Skin:    General: Skin is warm and dry.     Capillary Refill: Capillary refill takes less than 2 seconds.  Neurological:     Mental Status: He is alert.  Psychiatric:        Mood and Affect: Mood normal.      ED Course/ Medical Decision Making/ A&P Clinical Course as of 11/29/22 2247  Thu Mar 14, [id number]  [id number] eslicarbazepine acetate [CC]  2147 Sees [place] [CC]  2156 '800mg'$  is lower dose and split to BID.  [CC]    Clinical Course User Index [CC] Tretha Sciara, MD    Procedures Procedures   Medications Ordered in ED Medications  midazolam (VERSED) injection 2 mg (2 mg Intravenous Given 11/29/22 2209)  ketorolac (TORADOL) 15 MG/ML  injection 15 mg (15 mg Intravenous Given 11/29/22 2209)    Medical Decision Making:    KARNVIR CEJAS is a 27 y.o. male who presented to the ED today with seizure episode detailed above.     Patient's presentation is complicated by their history of multiple comorbid medical problems.  Patient placed on continuous vitals and telemetry monitoring while in ED which was reviewed periodically.   Complete initial physical exam performed, notably the patient  was hemodynamically stable no acute distress no focal neurologic deficits back to baseline at this time per significant other at bedside.      Reviewed and confirmed nursing documentation for past medical history, family history, social history.     Initial Assessment:   Patient's history present on his physical exam findings do not reveal any acute pathology.  He is back to baseline at this time.  Screening labs performed revealing no acute pathology.  Given patient's multiple breakthrough seizures, I consulted neurology for further recommendations due to delay in obtaining outpatient follow-up.  They recommended increasing the Aptiom dosing and changing it to morning.  Informed patient of this recommendation and patient feels comfortable making these changes.  He will call the neurology team in the morning to obtain outpatient follow-up. Given resolution of symptoms, well appearance, lack of any focal abnormality on recent extensive exam including CT head, patient will be discharged with plan for close outpatient follow-up with neurology.  Disposition:  I have considered need for hospitalization, however, considering all of the above, I believe this patient is stable for discharge at this time.  Patient/family educated about specific return precautions for given chief complaint and symptoms.  Patient/family educated about follow-up with PCP/Neurology.     Patient/family expressed understanding of return precautions and need for follow-up. Patient spoken to regarding all imaging and laboratory results and appropriate follow up for these results. All education provided in verbal form with additional information in written form. Time was allowed for answering of patient questions. Patient discharged.    Emergency Department Medication Summary:   Medications  midazolam (VERSED) injection 2 mg (2 mg Intravenous Given 11/29/22 2209)  ketorolac (TORADOL) 15 MG/ML injection 15 mg (15 mg Intravenous Given 11/29/22 2209)    Clinical Impression:  1. Seizure [facility])      Discharge   Final Clinical Impression(s) / ED Diagnoses Final diagnoses:  Seizure [facility])    Rx / DC Orders ED Discharge Orders     None         Tretha Sciara,  MD 11/29/22 2247

## 2022-11-29 NOTE — Discharge Instructions (Addendum)
Please take your 600 mg of Aptiom on tonight and then tomorrow morning switch over to 800 mg of Aptiom.  Call your neurologist or if unable to get a hold your neurologist you may request a second opinion as we discussed.  Thank you for the opportunity to participate in your care, Tretha Sciara MD

## 2022-11-30 ENCOUNTER — Telehealth: Payer: Self-pay

## 2022-11-30 ENCOUNTER — Other Ambulatory Visit: Payer: Self-pay

## 2022-11-30 ENCOUNTER — Emergency Department (HOSPITAL_COMMUNITY)
Admission: EM | Admit: 2022-11-30 | Discharge: 2022-12-01 | Disposition: A | Discharge status: Home / Self Care | Payer: BC Managed Care – PPO | Attending: Emergency Medicine | Admitting: Emergency Medicine

## 2022-11-30 ENCOUNTER — Encounter (HOSPITAL_COMMUNITY): Payer: Self-pay

## 2022-11-30 ENCOUNTER — Inpatient Hospital Stay: Payer: BC Managed Care – PPO | Admitting: Family Medicine

## 2022-11-30 ENCOUNTER — Other Ambulatory Visit: Payer: Self-pay | Admitting: Neurology

## 2022-11-30 DIAGNOSIS — R112 Nausea with vomiting, unspecified: Secondary | ICD-10-CM | POA: Insufficient documentation

## 2022-11-30 DIAGNOSIS — Z87898 Personal history of other specified conditions: Secondary | ICD-10-CM

## 2022-11-30 DIAGNOSIS — Z7951 Long term (current) use of inhaled steroids: Secondary | ICD-10-CM | POA: Insufficient documentation

## 2022-11-30 DIAGNOSIS — R1084 Generalized abdominal pain: Secondary | ICD-10-CM | POA: Insufficient documentation

## 2022-11-30 DIAGNOSIS — G40909 Epilepsy, unspecified, not intractable, without status epilepticus: Secondary | ICD-10-CM | POA: Diagnosis not present

## 2022-11-30 DIAGNOSIS — R569 Unspecified convulsions: Secondary | ICD-10-CM | POA: Diagnosis not present

## 2022-11-30 DIAGNOSIS — D72829 Elevated white blood cell count, unspecified: Secondary | ICD-10-CM | POA: Insufficient documentation

## 2022-11-30 DIAGNOSIS — J45909 Unspecified asthma, uncomplicated: Secondary | ICD-10-CM | POA: Diagnosis not present

## 2022-11-30 MED ORDER — CLOBAZAM 10 MG PO TABS: 10.0000 mg | ORAL_TABLET | Freq: Two times a day (BID) | ORAL | 5 refills | Status: DC

## 2022-11-30 NOTE — Transitions of Care (Post Inpatient/ED Visit) (Signed)
   11/30/2022  Name: Andrew Castillo MRN: [id number] DOB: 1995-12-30  Today's TOC FU Call Status: TOC FU Call Complete Date: 11/30/22  Transition Care Management Follow-up Telephone Call Date of Discharge: 11/29/22 [facility]charge Facility: [facility]) Type of Discharge: Emergency Department Reason for ED Visit: Other: (convulsions) How have you been since you were released from the hospital?: Same Any questions or concerns?: No  Items Reviewed: Did you receive and understand the discharge instructions provided?: Yes Medications obtained and verified?: Yes (Medications Reviewed) Any new allergies since your discharge?: No Dietary orders reviewed?: Yes Do you have support at home?: No People in Home: spouse  Home Care and Equipment/Supplies: [place] Ordered?: NA Any new equipment or medical supplies ordered?: NA  Functional Questionnaire: Do you need assistance with bathing/showering or dressing?: No Do you need assistance with meal preparation?: No Do you need assistance with eating?: No Do you have difficulty maintaining continence: No Do you need assistance with getting out of bed/getting out of a chair/moving?: No Do you have difficulty managing or taking your medications?: No  Folllow up appointments reviewed: PCP Follow-up appointment confirmed?: Yes Date of PCP follow-up appointment?: 12/04/22 Follow-up Provider: [facility] Follow-up appointment confirmed?: Yes Date of Specialist follow-up appointment?: 02/16/23 Follow-Up Specialty Provider:: Dr Dwyane Dee Do you need transportation to your follow-up appointment?: No Do you understand care options if your condition(s) worsen?: Yes-patient verbalized understanding    [place], [place] Nurse Health Advisor Direct Dial [phone]

## 2022-11-30 NOTE — ED Triage Notes (Signed)
Patient reports abd pain all over and vomiting, states it started this morning. Was here yesterday for a seizure. Denies any diarrhea.

## 2022-12-01 LAB — CBC WITH DIFFERENTIAL/PLATELET
Abs Immature Granulocytes: 0.06 10*3/uL (ref 0.00–0.07)
Basophils Absolute: 0 10*3/uL (ref 0.0–0.1)
Basophils Relative: 0 %
Eosinophils Absolute: 0 10*3/uL (ref 0.0–0.5)
Eosinophils Relative: 0 %
HCT: 50.6 % (ref 39.0–52.0)
Hemoglobin: 17 g/dL (ref 13.0–17.0)
Immature Granulocytes: 0 %
Lymphocytes Relative: 8 %
Lymphs Abs: 1.1 10*3/uL (ref 0.7–4.0)
MCH: 29.4 pg (ref 26.0–34.0)
MCHC: 33.6 g/dL (ref 30.0–36.0)
MCV: 87.5 fL (ref 80.0–100.0)
Monocytes Absolute: 0.5 10*3/uL (ref 0.1–1.0)
Monocytes Relative: 3 %
Neutro Abs: 13.3 10*3/uL — ABNORMAL HIGH (ref 1.7–7.7)
Neutrophils Relative %: 89 %
Platelets: 301 10*3/uL (ref 150–400)
RBC: 5.78 MIL/uL (ref 4.22–5.81)
RDW: 12.9 % (ref 11.5–15.5)
WBC: 15 10*3/uL — ABNORMAL HIGH (ref 4.0–10.5)
nRBC: 0 % (ref 0.0–0.2)

## 2022-12-01 LAB — COMPREHENSIVE METABOLIC PANEL
ALT: 22 U/L (ref 0–44)
AST: 21 U/L (ref 15–41)
Albumin: 4.9 g/dL (ref 3.5–5.0)
Alkaline Phosphatase: 60 U/L (ref 38–126)
Anion gap: 10 (ref 5–15)
BUN: 9 mg/dL (ref 6–20)
CO2: 24 mmol/L (ref 22–32)
Calcium: 9.4 mg/dL (ref 8.9–10.3)
Chloride: 106 mmol/L (ref 98–111)
Creatinine, Ser: 1.02 mg/dL (ref 0.61–1.24)
GFR, Estimated: >60 mL/min (ref >60)
Glucose, Bld: 141 mg/dL — ABNORMAL HIGH (ref 70–99)
Potassium: 4 mmol/L (ref 3.5–5.1)
Sodium: 140 mmol/L (ref 135–145)
Total Bilirubin: 1.2 mg/dL (ref 0.3–1.2)
Total Protein: 7.9 g/dL (ref 6.5–8.1)

## 2022-12-01 LAB — LIPASE, BLOOD: Lipase: 41 U/L (ref 11–51)

## 2022-12-01 MED ORDER — SODIUM CHLORIDE 0.9 % IV BOLUS (SEPSIS)
1000.0000 mL | Freq: Once | INTRAVENOUS | Status: AC
  Administered 2022-12-01: 1000 mL via INTRAVENOUS

## 2022-12-01 MED ORDER — HALOPERIDOL LACTATE 5 MG/ML IJ SOLN
2.0000 mg | Freq: Once | INTRAMUSCULAR | Status: AC
  Administered 2022-12-01: 2 mg via INTRAVENOUS
  Filled 2022-12-01: qty 1

## 2022-12-01 NOTE — ED Notes (Signed)
Discharge papers reviewed with pt, questions addressed.

## 2022-12-01 NOTE — ED Provider Notes (Signed)
[place] EMERGENCY DEPARTMENT AT [facility] Provider Note   CSN: [id number] Arrival date & time: 11/30/22  2321     History  Chief Complaint  Patient presents with   Emesis   Abdominal Pain    Andrew Castillo is a 27 y.o. male.  The history is provided by the patient and a significant other.  Patient with history of seizures, persistent vomiting, substance use disorder presents with vomiting.  Patient was seen in the emergency room yesterday for seizures.  He is awaiting follow-up with neurology.  This morning he woke up and had generalized abdominal pain and multiple episodes of nausea vomiting.  He reports most of the emesis has been nonbloody.  He reports up to 20 episodes.  No diarrhea.  He reports abdominal discomfort.  This is similar to prior episodes. Reports it typically will resolve with Haldol but he is unable to take his meds at home  No active chest pain at this time    Past Medical History:  Diagnosis Date   Childhood asthma    Environmental allergies    GERD (gastroesophageal reflux disease)    Headache    migraines   Seasonal allergic conjunctivitis    Seizures ([place])    caused by flexiril 01/2022    Home Medications Prior to Admission medications   Medication Sig Start Date End Date Taking? Authorizing Provider  cloBAZam (ONFI) 10 MG tablet Take 1 tablet (10 mg total) by mouth 2 (two) times daily. 11/30/22 05/29/23  Alric Ran, MD  diazePAM, 15 MG Dose, (VALTOCO 15 MG DOSE) 2 x 7.5 MG/0.1ML LQPK Place 15 mg into the nose as needed (For seizure lasting more than 5 minutes or for seizure clusters). 11/20/22   Alric Ran, MD  dicyclomine (BENTYL) 10 MG capsule Take 1 capsule (10 mg total) by mouth 4 (four) times daily -  before meals and at bedtime. Patient taking differently: Take 10 mg by mouth daily as needed for spasms. 04/05/22   Willia Craze, NP  fluticasone (FLONASE) 50 MCG/ACT nasal spray SPRAY 2 SPRAYS INTO EACH NOSTRIL EVERY  DAY Patient taking differently: Place 2 sprays into both nostrils daily as needed for allergies. 08/22/22   [name], [name], NP-C  haloperidol (HALDOL) 5 MG tablet Take 1 tablet (5 mg total) by mouth every 6 (six) hours as needed (nausea, vomiting). 10/31/22   Gatha Mayer, MD  hydrOXYzine (ATARAX) 25 MG tablet Take 1 tablet (25 mg total) by mouth every 6 (six) hours for 7 days. 11/25/22 12/02/22  Nuala Alpha A, PA-C  levocetirizine (XYZAL) 5 MG tablet TAKE 1 TABLET BY MOUTH EVERY DAY IN THE EVENING Patient taking differently: Take 5 mg by mouth daily as needed for allergies. 08/22/22   [name], [name], NP-C  ondansetron (ZOFRAN) 8 MG tablet Take 1 tablet (8 mg total) by mouth every 8 (eight) hours as needed for nausea or vomiting. 10/21/22   Pattricia Boss, MD  ondansetron (ZOFRAN-ODT) 8 MG disintegrating tablet Take 1 tablet (8 mg total) by mouth every 8 (eight) hours as needed for nausea or vomiting. 0000000   Delora Fuel, MD  pantoprazole (PROTONIX) 40 MG tablet Take 1 tablet (40 mg total) by mouth 2 (two) times daily before a meal. 08/02/22   Cirigliano, Vito V, DO  prochlorperazine (COMPAZINE) 25 MG suppository Place 1 suppository (25 mg total) rectally every 12 (twelve) hours as needed for nausea or vomiting. 10/21/22   Pattricia Boss, MD  promethazine (PHENERGAN) 25 MG  suppository Place 1 suppository (25 mg total) rectally every 6 (six) hours as needed for nausea or vomiting. 08/02/22   Cirigliano, Vito V, DO  sucralfate (CARAFATE) 1 GM/10ML suspension Take 10 mLs (1 g total) by mouth 4 (four) times daily -  with meals and at bedtime. 09/13/22 01/11/23  Gatha Mayer, MD      Allergies    Penicillins and Flexeril [cyclobenzaprine]    Review of Systems   Review of Systems  Constitutional:  Negative for fever.  Gastrointestinal:  Positive for nausea and vomiting.    Physical Exam Updated Vital Signs BP 137/70   Pulse 78   Temp 98 F (36.7 C) (Oral)   Resp 19   Ht 1.829 m (6')    Wt 95.3 kg   SpO2 97%   BMI 28.48 kg/m  Physical Exam CONSTITUTIONAL: Well developed/well nourished HEAD: Normocephalic/atraumatic EYES: EOMI/PERRL, no icterus ENMT: Mucous membranes dry NECK: supple no meningeal signs CV: S1/S2 noted, no murmurs/rubs/gallops noted LUNGS: Lungs are clear to auscultation bilaterally, no apparent distress ABDOMEN: soft, nontender, no rebound or guarding, bowel sounds noted throughout abdomen GU:no cva tenderness NEURO: Pt is awake/alert/appropriate, moves all extremitiesx4.  No facial droop.   EXTREMITIES: pulses normal/equal, full ROM SKIN: warm, color normal PSYCH: no abnormalities of mood noted, alert and oriented to situation  ED Results / Procedures / Treatments   Labs (all labs ordered are listed, but only abnormal results are displayed) Labs Reviewed  CBC WITH DIFFERENTIAL/PLATELET - Abnormal; Notable for the following components:      Result Value   WBC 15.0 (*)    Neutro Abs 13.3 (*)    All other components within normal limits  COMPREHENSIVE METABOLIC PANEL - Abnormal; Notable for the following components:   Glucose, Bld 141 (*)    All other components within normal limits  LIPASE, BLOOD    EKG EKG Interpretation  Date/Time:  Saturday December 01 2022 00:16:50 EDT Ventricular Rate:  68 PR Interval:  135 QRS Duration: 91 QT Interval:  415 QTC Calculation: 442 R Axis:   62 Text Interpretation: Sinus rhythm Confirmed by Ripley Fraise [phone]) on 12/01/2022 12:32:38 AM  Radiology No results found.  Procedures Procedures    Medications Ordered in ED Medications  sodium chloride 0.9 % bolus 1,000 mL (0 mLs Intravenous Stopped 12/01/22 0150)  haloperidol lactate (HALDOL) injection 2 mg (2 mg Intravenous Given 12/01/22 0047)    ED Course/ Medical Decision Making/ A&P Clinical Course as of 12/01/22 0257  Sat Dec 01, 2022  0046 WBC(!): 15.0 Leukocytosis [DW]  0127 Glucose(!): 141 Hyperglycemia [DW]  0127 Patient resting  comfortably, no acute distress.  At family request, ambulatory referral to neuro has been placed. [DW]  [id number] Patient overall improved.  Vitals improved.  He is resting comfortably, reports pain resolved and he is taking p.o. fluids. [DW]  [id number] Per gastroenterology notes, persistent vomiting causes unclear.  However he is able to tolerate Haldol at home.  Here he is follow-up next month with GI.  No indication for further workup or imaging.  Patient is safe for discharge home.  Patient and significant other agreeable with plan [DW]    Clinical Course User Index [DW] Ripley Fraise, MD                             Medical Decision Making Amount and/or Complexity of Data Reviewed Labs: ordered. Decision-making details documented in ED Course. ECG/medicine tests: ordered.  Risk Prescription drug management.   This patient presents to the ED for concern of vomiting, this involves an extensive number of treatment options, and is a complaint that carries with it a high risk of complications and morbidity.  The differential diagnosis includes but is not limited to cholecystitis, cholelithiasis, pancreatitis, gastritis, peptic ulcer disease, , bowel obstruction, substance induced hyperemesis  Comorbidities that complicate the patient evaluation: Patient's presentation is complicated by their history of seizures  Social Determinants of Health: Patient's  substance use disorder   increases the complexity of managing their presentation  Additional history obtained: Additional history obtained from significant other Records reviewed  outpatient records reviewed  Lab Tests: I Ordered, and personally interpreted labs.  The pertinent results include: Leukocytosis   Medicines ordered and prescription drug management: I ordered medication including Haldol and IV fluids for nausea and vomiting Reevaluation of the patient after these medicines showed that the patient    improved  Test  Considered: Considered further workup and imaging, patient has had multiple evaluations previously, this is an acute on chronic process will defer imaging for now   Reevaluation: After the interventions noted above, I reevaluated the patient and found that they have :improved  Complexity of problems addressed: Patient's presentation is most consistent with  acute presentation with potential threat to life or bodily function  Disposition: After consideration of the diagnostic results and the patient's response to treatment,  I feel that the patent would benefit from discharge   .           Final Clinical Impression(s) / ED Diagnoses Final diagnoses:  Nausea and vomiting, unspecified vomiting type  History of seizure    Rx / DC Orders ED Discharge Orders          Ordered    Ambulatory referral to Neurology       Comments: An appointment is requested in approximately: 1 week   12/01/22 0047              Ripley Fraise, MD 12/01/22 [phone]

## 2022-12-03 ENCOUNTER — Telehealth: Payer: BC Managed Care – PPO | Admitting: Physician Assistant

## 2022-12-03 ENCOUNTER — Other Ambulatory Visit: Payer: Self-pay

## 2022-12-03 ENCOUNTER — Observation Stay (HOSPITAL_COMMUNITY)
Admission: EM | Admit: 2022-12-03 | Discharge: 2022-12-04 | Disposition: A | Discharge status: Home / Self Care | Payer: BC Managed Care – PPO | Attending: Internal Medicine | Admitting: Internal Medicine

## 2022-12-03 ENCOUNTER — Encounter (HOSPITAL_COMMUNITY): Payer: Self-pay

## 2022-12-03 ENCOUNTER — Emergency Department (HOSPITAL_COMMUNITY): Payer: BC Managed Care – PPO

## 2022-12-03 DIAGNOSIS — R112 Nausea with vomiting, unspecified: Secondary | ICD-10-CM | POA: Diagnosis present

## 2022-12-03 DIAGNOSIS — N281 Cyst of kidney, acquired: Secondary | ICD-10-CM | POA: Diagnosis not present

## 2022-12-03 DIAGNOSIS — F17210 Nicotine dependence, cigarettes, uncomplicated: Secondary | ICD-10-CM | POA: Diagnosis not present

## 2022-12-03 DIAGNOSIS — K449 Diaphragmatic hernia without obstruction or gangrene: Secondary | ICD-10-CM | POA: Diagnosis not present

## 2022-12-03 DIAGNOSIS — A0471 Enterocolitis due to Clostridium difficile, recurrent: Principal | ICD-10-CM | POA: Insufficient documentation

## 2022-12-03 DIAGNOSIS — F1410 Cocaine abuse, uncomplicated: Secondary | ICD-10-CM | POA: Diagnosis present

## 2022-12-03 DIAGNOSIS — K6389 Other specified diseases of intestine: Secondary | ICD-10-CM | POA: Diagnosis not present

## 2022-12-03 DIAGNOSIS — R9431 Abnormal electrocardiogram [ECG] [EKG]: Secondary | ICD-10-CM | POA: Diagnosis not present

## 2022-12-03 DIAGNOSIS — Z8669 Personal history of other diseases of the nervous system and sense organs: Secondary | ICD-10-CM | POA: Diagnosis not present

## 2022-12-03 DIAGNOSIS — K828 Other specified diseases of gallbladder: Secondary | ICD-10-CM | POA: Diagnosis not present

## 2022-12-03 DIAGNOSIS — K529 Noninfective gastroenteritis and colitis, unspecified: Secondary | ICD-10-CM

## 2022-12-03 DIAGNOSIS — F17200 Nicotine dependence, unspecified, uncomplicated: Secondary | ICD-10-CM | POA: Diagnosis present

## 2022-12-03 DIAGNOSIS — R197 Diarrhea, unspecified: Secondary | ICD-10-CM | POA: Diagnosis not present

## 2022-12-03 DIAGNOSIS — R569 Unspecified convulsions: Secondary | ICD-10-CM | POA: Diagnosis not present

## 2022-12-03 DIAGNOSIS — Z720 Tobacco use: Secondary | ICD-10-CM | POA: Diagnosis not present

## 2022-12-03 LAB — COMPREHENSIVE METABOLIC PANEL
ALT: 21 U/L (ref 0–44)
AST: 20 U/L (ref 15–41)
Albumin: 4.5 g/dL (ref 3.5–5.0)
Alkaline Phosphatase: 55 U/L (ref 38–126)
Anion gap: 9 (ref 5–15)
BUN: 13 mg/dL (ref 6–20)
CO2: 26 mmol/L (ref 22–32)
Calcium: 8.7 mg/dL — ABNORMAL LOW (ref 8.9–10.3)
Chloride: 103 mmol/L (ref 98–111)
Creatinine, Ser: 1.03 mg/dL (ref 0.61–1.24)
GFR, Estimated: >60 mL/min (ref >60)
Glucose, Bld: 103 mg/dL — ABNORMAL HIGH (ref 70–99)
Potassium: 3.8 mmol/L (ref 3.5–5.1)
Sodium: 138 mmol/L (ref 135–145)
Total Bilirubin: 1 mg/dL (ref 0.3–1.2)
Total Protein: 7.3 g/dL (ref 6.5–8.1)

## 2022-12-03 LAB — CBC WITH DIFFERENTIAL/PLATELET
Abs Immature Granulocytes: 0.07 10*3/uL (ref 0.00–0.07)
Basophils Absolute: 0 10*3/uL (ref 0.0–0.1)
Basophils Relative: 0 %
Eosinophils Absolute: 0 10*3/uL (ref 0.0–0.5)
Eosinophils Relative: 0 %
HCT: 48.5 % (ref 39.0–52.0)
Hemoglobin: 16.1 g/dL (ref 13.0–17.0)
Immature Granulocytes: 1 %
Lymphocytes Relative: 13 %
Lymphs Abs: 1.9 10*3/uL (ref 0.7–4.0)
MCH: 29.2 pg (ref 26.0–34.0)
MCHC: 33.2 g/dL (ref 30.0–36.0)
MCV: 88 fL (ref 80.0–100.0)
Monocytes Absolute: 0.6 10*3/uL (ref 0.1–1.0)
Monocytes Relative: 4 %
Neutro Abs: 11.9 10*3/uL — ABNORMAL HIGH (ref 1.7–7.7)
Neutrophils Relative %: 82 %
Platelets: 272 10*3/uL (ref 150–400)
RBC: 5.51 MIL/uL (ref 4.22–5.81)
RDW: 12.5 % (ref 11.5–15.5)
WBC: 14.6 10*3/uL — ABNORMAL HIGH (ref 4.0–10.5)
nRBC: 0 % (ref 0.0–0.2)

## 2022-12-03 LAB — ETHANOL: Alcohol, Ethyl (B): <10 mg/dL (ref <10)

## 2022-12-03 LAB — URINALYSIS, ROUTINE W REFLEX MICROSCOPIC
Bilirubin Urine: NEGATIVE
Glucose, UA: NEGATIVE mg/dL
Hgb urine dipstick: NEGATIVE
Ketones, ur: 5 mg/dL — AB
Leukocytes,Ua: NEGATIVE
Nitrite: NEGATIVE
Protein, ur: NEGATIVE mg/dL
Specific Gravity, Urine: 1.012 (ref 1.005–1.030)
pH: 8 (ref 5.0–8.0)

## 2022-12-03 LAB — RAPID URINE DRUG SCREEN, HOSP PERFORMED
Amphetamines: NOT DETECTED
Barbiturates: NOT DETECTED
Benzodiazepines: NOT DETECTED
Cocaine: POSITIVE — AB
Opiates: NOT DETECTED
Tetrahydrocannabinol: POSITIVE — AB

## 2022-12-03 LAB — TSH: TSH: 0.932 u[IU]/mL (ref 0.350–4.500)

## 2022-12-03 LAB — MAGNESIUM: Magnesium: 2.2 mg/dL (ref 1.7–2.4)

## 2022-12-03 LAB — PROCALCITONIN: Procalcitonin: <0.1 ng/mL

## 2022-12-03 LAB — CK: Total CK: 364 U/L (ref 49–397)

## 2022-12-03 LAB — PHOSPHORUS: Phosphorus: 3.5 mg/dL (ref 2.5–4.6)

## 2022-12-03 LAB — LACTIC ACID, PLASMA: Lactic Acid, Venous: 1.2 mmol/L (ref 0.5–1.9)

## 2022-12-03 LAB — CREATININE, URINE, RANDOM: Creatinine, Urine: 45 mg/dL

## 2022-12-03 LAB — LIPASE, BLOOD: Lipase: 32 U/L (ref 11–51)

## 2022-12-03 LAB — SODIUM, URINE, RANDOM: Sodium, Ur: 157 mmol/L

## 2022-12-03 MED ORDER — LORAZEPAM 2 MG/ML IJ SOLN: 0.5000 mg | Freq: Four times a day (QID) | INTRAMUSCULAR | Status: DC | PRN

## 2022-12-03 MED ORDER — ONDANSETRON HCL 4 MG/2ML IJ SOLN
4.0000 mg | Freq: Four times a day (QID) | INTRAMUSCULAR | Status: DC | PRN
  Administered 2022-12-04: 4 mg via INTRAVENOUS
  Filled 2022-12-03: qty 2

## 2022-12-03 MED ORDER — DIPHENHYDRAMINE HCL 50 MG/ML IJ SOLN
12.5000 mg | Freq: Once | INTRAMUSCULAR | Status: AC
  Administered 2022-12-03: 12.5 mg via INTRAVENOUS
  Filled 2022-12-03: qty 1

## 2022-12-03 MED ORDER — ONDANSETRON HCL 4 MG PO TABS: 4.0000 mg | ORAL_TABLET | Freq: Four times a day (QID) | ORAL | Status: DC | PRN

## 2022-12-03 MED ORDER — HALOPERIDOL LACTATE 5 MG/ML IJ SOLN
2.0000 mg | Freq: Once | INTRAMUSCULAR | Status: AC
  Administered 2022-12-03: 2 mg via INTRAVENOUS
  Filled 2022-12-03: qty 1

## 2022-12-03 MED ORDER — ACETAMINOPHEN 325 MG PO TABS: 650.0000 mg | ORAL_TABLET | Freq: Four times a day (QID) | ORAL | Status: DC | PRN

## 2022-12-03 MED ORDER — CIPROFLOXACIN IN D5W 400 MG/200ML IV SOLN
400.0000 mg | Freq: Two times a day (BID) | INTRAVENOUS | Status: DC
  Administered 2022-12-03 – 2022-12-04 (×2): 400 mg via INTRAVENOUS
  Filled 2022-12-03 (×2): qty 200

## 2022-12-03 MED ORDER — HYDROCODONE-ACETAMINOPHEN 5-325 MG PO TABS: 1.0000 | ORAL_TABLET | ORAL | Status: DC | PRN

## 2022-12-03 MED ORDER — LACTATED RINGERS IV SOLN: INTRAVENOUS | Status: DC

## 2022-12-03 MED ORDER — PANTOPRAZOLE SODIUM 40 MG IV SOLR
40.0000 mg | Freq: Once | INTRAVENOUS | Status: AC
  Administered 2022-12-03: 40 mg via INTRAVENOUS
  Filled 2022-12-03: qty 10

## 2022-12-03 MED ORDER — LACTATED RINGERS IV BOLUS
1000.0000 mL | Freq: Once | INTRAVENOUS | Status: AC
  Administered 2022-12-03: 1000 mL via INTRAVENOUS

## 2022-12-03 MED ORDER — NICOTINE 21 MG/24HR TD PT24
21.0000 mg | MEDICATED_PATCH | Freq: Every day | TRANSDERMAL | Status: DC
  Administered 2022-12-03 – 2022-12-04 (×2): 21 mg via TRANSDERMAL
  Filled 2022-12-03 (×2): qty 1

## 2022-12-03 MED ORDER — HALOPERIDOL 5 MG PO TABS: 5.0000 mg | ORAL_TABLET | Freq: Four times a day (QID) | ORAL | Status: DC | PRN

## 2022-12-03 MED ORDER — CLONIDINE HCL 0.1 MG PO TABS
0.1000 mg | ORAL_TABLET | Freq: Two times a day (BID) | ORAL | Status: DC | PRN
  Administered 2022-12-03: 0.1 mg via ORAL
  Filled 2022-12-03: qty 1

## 2022-12-03 MED ORDER — LORATADINE 10 MG PO TABS: 10.0000 mg | ORAL_TABLET | Freq: Every day | ORAL | Status: DC | PRN

## 2022-12-03 MED ORDER — ACETAMINOPHEN 650 MG RE SUPP: 650.0000 mg | Freq: Four times a day (QID) | RECTAL | Status: DC | PRN

## 2022-12-03 MED ORDER — SODIUM CHLORIDE 0.9 % IV SOLN: INTRAVENOUS | Status: AC

## 2022-12-03 MED ORDER — SUCRALFATE 1 GM/10ML PO SUSP
1.0000 g | Freq: Three times a day (TID) | ORAL | Status: DC
  Administered 2022-12-04 (×2): 1 g via ORAL
  Filled 2022-12-03 (×2): qty 10

## 2022-12-03 MED ORDER — CLOBAZAM 10 MG PO TABS
10.0000 mg | ORAL_TABLET | Freq: Two times a day (BID) | ORAL | Status: DC
  Administered 2022-12-03 – 2022-12-04 (×2): 10 mg via ORAL
  Filled 2022-12-03 (×2): qty 1

## 2022-12-03 MED ORDER — IOHEXOL 300 MG/ML  SOLN
100.0000 mL | Freq: Once | INTRAMUSCULAR | Status: AC | PRN
  Administered 2022-12-03: 100 mL via INTRAVENOUS

## 2022-12-03 MED ORDER — PANTOPRAZOLE SODIUM 40 MG PO TBEC
40.0000 mg | DELAYED_RELEASE_TABLET | Freq: Two times a day (BID) | ORAL | Status: DC
  Administered 2022-12-04: 40 mg via ORAL
  Filled 2022-12-03: qty 1

## 2022-12-03 MED ORDER — METRONIDAZOLE 500 MG/100ML IV SOLN
500.0000 mg | Freq: Two times a day (BID) | INTRAVENOUS | Status: DC
  Administered 2022-12-03 – 2022-12-04 (×2): 500 mg via INTRAVENOUS
  Filled 2022-12-03 (×2): qty 100

## 2022-12-03 MED ORDER — METOCLOPRAMIDE HCL 5 MG/ML IJ SOLN
5.0000 mg | Freq: Once | INTRAMUSCULAR | Status: AC
  Administered 2022-12-03: 5 mg via INTRAVENOUS
  Filled 2022-12-03: qty 2

## 2022-12-03 MED ORDER — METOCLOPRAMIDE HCL 5 MG/ML IJ SOLN: 5.0000 mg | Freq: Four times a day (QID) | INTRAMUSCULAR | Status: DC | PRN

## 2022-12-03 MED ORDER — LEVOCETIRIZINE DIHYDROCHLORIDE 5 MG PO TABS: 5.0000 mg | ORAL_TABLET | Freq: Every day | ORAL | Status: DC | PRN

## 2022-12-03 NOTE — ED Provider Notes (Signed)
[place] EMERGENCY DEPARTMENT AT [facility] Provider Note   CSN: [id number] Arrival date & time: 12/03/22  1448     History  Chief Complaint  Patient presents with   Abdominal Pain   Emesis    KRYSTOFER CONNON is a 27 y.o. male.  With PMH of seizures, GERD,  persistent vomiting, substance use disorder presenting with abdominal pain and vomiting.  He says he has been dealing with this for a while and also follows GI who has done a scope for him told him he has gastritis and esophagitis as well as possible cyclical vomiting syndrome.  He last drink alcohol on Thursday.  He feels like this episode he has been dealing with symptoms since past Friday.  He took a Compazine suppository, Haldol and Zofran at home without relief today.  He has had no fevers.  Emesis has been nonbloody nonbilious.  Last bowel movement about 2 days ago still passing gas.  No history of abdominal surgery.  Denies any urinary symptoms.  Denies any focal weakness numbness or tingling or CP or SOB.   Abdominal Pain Associated symptoms: vomiting   Emesis Associated symptoms: abdominal pain        Home Medications Prior to Admission medications   Medication Sig Start Date End Date Taking? Authorizing Provider  cloBAZam (ONFI) 10 MG tablet Take 1 tablet (10 mg total) by mouth 2 (two) times daily. 11/30/22 05/29/23  Alric Ran, MD  diazePAM, 15 MG Dose, (VALTOCO 15 MG DOSE) 2 x 7.5 MG/0.1ML LQPK Place 15 mg into the nose as needed (For seizure lasting more than 5 minutes or for seizure clusters). 11/20/22   Alric Ran, MD  dicyclomine (BENTYL) 10 MG capsule Take 1 capsule (10 mg total) by mouth 4 (four) times daily -  before meals and at bedtime. Patient taking differently: Take 10 mg by mouth daily as needed for spasms. 04/05/22   Willia Craze, NP  fluticasone (FLONASE) 50 MCG/ACT nasal spray SPRAY 2 SPRAYS INTO EACH NOSTRIL EVERY DAY Patient taking differently: Place 2 sprays into both  nostrils daily as needed for allergies. 08/22/22   [name], [name], NP-C  haloperidol (HALDOL) 5 MG tablet Take 1 tablet (5 mg total) by mouth every 6 (six) hours as needed (nausea, vomiting). 10/31/22   Gatha Mayer, MD  levocetirizine (XYZAL) 5 MG tablet TAKE 1 TABLET BY MOUTH EVERY DAY IN THE EVENING Patient taking differently: Take 5 mg by mouth daily as needed for allergies. 08/22/22   [name], [name], NP-C  ondansetron (ZOFRAN) 8 MG tablet Take 1 tablet (8 mg total) by mouth every 8 (eight) hours as needed for nausea or vomiting. 10/21/22   Pattricia Boss, MD  ondansetron (ZOFRAN-ODT) 8 MG disintegrating tablet Take 1 tablet (8 mg total) by mouth every 8 (eight) hours as needed for nausea or vomiting. 0000000   Delora Fuel, MD  pantoprazole (PROTONIX) 40 MG tablet Take 1 tablet (40 mg total) by mouth 2 (two) times daily before a meal. 08/02/22   Cirigliano, Vito V, DO  prochlorperazine (COMPAZINE) 25 MG suppository Place 1 suppository (25 mg total) rectally every 12 (twelve) hours as needed for nausea or vomiting. 10/21/22   Pattricia Boss, MD  promethazine (PHENERGAN) 25 MG suppository Place 1 suppository (25 mg total) rectally every 6 (six) hours as needed for nausea or vomiting. 08/02/22   Cirigliano, Vito V, DO  sucralfate (CARAFATE) 1 GM/10ML suspension Take 10 mLs (1 g total) by mouth 4 (four)  times daily -  with meals and at bedtime. 09/13/22 01/11/23  Gatha Mayer, MD      Allergies    Penicillins and Flexeril [cyclobenzaprine]    Review of Systems   Review of Systems  Gastrointestinal:  Positive for abdominal pain and vomiting.    Physical Exam Updated Vital Signs BP (!) 167/95 (BP Location: Left Arm)   Pulse 97   Temp 98.9 F (37.2 C) (Oral)   Resp 18   Ht 6' (1.829 m)   Wt 95.3 kg   SpO2 97%   BMI 28.48 kg/m  Physical Exam Constitutional: Alert and oriented.  Laying on side in bed holding emesis bag but no active emesis Eyes: Conjunctivae are normal. ENT      Head:  Normocephalic and atraumatic.      Mouth/Throat: Mucous membranes are moist. Cardiovascular: S1, S2, regular rate and rhythm Respiratory: Normal respiratory effort.  O2 sat 97 on RA Gastrointestinal: Soft , nondistended.  No rebound, no guarding, diffuse mild tenderness without localization Musculoskeletal: Normal range of motion in all extremities. Neurologic: Normal speech and language.  CN II through XII grossly intact.  5 out of 5 strength bilateral upper and lower extremities.  Sensation grossly intact.  No gross focal neurologic deficits are appreciated. Skin: Skin is warm, dry and intact.  Psychiatric: Mood and affect are normal. Speech and behavior are normal.  ED Results / Procedures / Treatments   Labs (all labs ordered are listed, but only abnormal results are displayed) Labs Reviewed - No data to display  EKG None  Radiology No results found.  Procedures Procedures  Remain on constant cardiac monitoring sinus rhythm with normal rates. Medications Ordered in ED Medications - No data to display  ED Course/ Medical Decision Making/ A&P Clinical Course as of 12/03/22 2345  Mon Dec 03, 2022  1626 Nurse reported the patient had seizure-like activity however when I went to the patient's room he is resting comfortably.  He is not postictal.  There is no tongue bites or signs of seizure.  He told me he had " panic attacks" and this happens when he feels anxious and has all the cords and everything on him in the bed. [VB]  [id number] Labs reviewed by me leukocytosis 14.6 with left shift could be acute phase reactant from vomiting.  Lipase within normal limits and no transaminitis doubt acute pancreatitis or biliary pathology.  Creatinine 1.03.  Glucose 103 no acute electrolyte abnormalities. [VB]  [id number] CTAP with contrast reviewed by me no evidence of bowel obstruction.  Read by radiology as nonspecific findings suggestive of enterocolitis.  Holding off from antibiotics as this has been a  chronic issue and patient afebrile with no bloody diarrhea.  CT head unremarkable.  Reassessed patient he just vomited p.o. challenge of ginger ale immediately after drinking fluid.  Ordering more nausea medicines and reach out to hospitalist for admission. [VB]  1949 Patient admitted to Dr. Roel Cluck for intractable vomiting in the setting of enterocolitis. [VB]    Clinical Course User Index [VB] Elgie Congo, MD                             Medical Decision Making PEARLEY THRESHER is a 27 y.o. male.  With PMH of seizures, GERD,  persistent vomiting, substance use disorder presenting with abdominal pain and vomiting.    Patient is followed closely for chronic abdominal pain vomiting.  Suspect likely cyclical  vomiting syndrome or IBS.  But also consider acute pancreatitis, gastroenteritis/enteritis, colitis among multiple other etiologies.  On exam his abdomen is quite benign and not peritonitic.  Additionally, he has no focal neurologic deficits and had a recent MRI in February which was unremarkable, doubt neurologic etiology.  Labs reviewed by me leukocytosis 14.6 with left shift could be acute phase reactant from vomiting.  Lipase within normal limits and no transaminitis doubt acute pancreatitis or biliary pathology.  Creatinine 1.03.  Glucose 103 no acute electrolyte abnormalities. [VB]  CTAP with contrast reviewed by me no evidence of bowel obstruction.  Read by radiology as nonspecific findings suggestive of enterocolitis.  Holding off from antibiotics as this has been a chronic issue and patient afebrile with no bloody diarrhea.  CT head unremarkable.  Reassessed patient he just vomited p.o. challenge of ginger ale immediately after drinking fluid.  Ordering more nausea medicines and reach out to hospitalist for admission. [VB]  Admitted to Dr Roel Cluck.   Amount and/or Complexity of Data Reviewed External Data Reviewed: notes.    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 Had EGD noted to have hiatal hernia and esophagitis/gastritis.  Labs: ordered. Radiology: ordered.  Risk Prescription drug management. Decision regarding hospitalization.    Final Clinical Impression(s) / ED Diagnoses Final diagnoses:  None    Rx / DC Orders ED Discharge Orders     None         Elgie Congo, MD 12/03/22 [phone]

## 2022-12-03 NOTE — ED Notes (Signed)
RN gave patient 3oz of Ginger ale to attempt PO challenge patient drank Ginger ale and immediately threw up MD aware.

## 2022-12-03 NOTE — Assessment & Plan Note (Signed)
Recurrent in the setting of cannabinoid use and cocaine. Supportive measures try Haldol appreciate GI consult sent message to Dr. Havery Moros of [facility]

## 2022-12-03 NOTE — Assessment & Plan Note (Addendum)
Bowel rest, Cipro Flagyl antibiotics IV, Supportive management [facility] will be notified for consult

## 2022-12-03 NOTE — Progress Notes (Signed)
Because of severity and frequency of vomiting along with associated symptoms, I feel your condition warrants further evaluation and I recommend that you be seen in a face to face visit.   NOTE: There will be NO CHARGE for this eVisit   If you are having a true medical emergency please call 911.      For an urgent face to face visit, [place] has eight urgent care centers for your convenience:   NEW!! [place] [facility] [place] at [place] Get Driving Directions [id number] [address] [place], [place] [facility] [place] at [place] Get Driving Directions [id number] [place], [place] [address]   [place] [facility] [place] [facility] Get Driving Directions [id number] [address], [place] [address]  [place] [facility] [place] ([place]) Get Driving Directions [id number] [address] [place],  [place]  [address]  [place] [facility] at [place] Get Driving Directions  [phone] [address] [place],  [place] [address]   [place] [facility] at [facility] Get Driving Directions [id number] [place], [place], [place] [address]   [place] [facility] at [facility] Get Driving Directions  [id number] [address] [place], [place] [address]   [place] [facility] at [place] Get Driving Directions [id number] [address], [place], [place] [address]  Your MyChart E-visit questionnaire answers were reviewed by a board certified advanced clinical practitioner to complete your personal care plan based on your specific symptoms.  Thank you for using e-Visits.

## 2022-12-03 NOTE — Assessment & Plan Note (Signed)
-   Spoke about importance of quitting spent 5 minutes discussing options for treatment, prior attempts at quitting, and dangers of smoking  -At this point patient is     interested in quitting  - order nicotine patch   - nursing tobacco cessation protocol  

## 2022-12-03 NOTE — ED Notes (Signed)
While RN was in room patient had 4 seizures. RN assisted in rolling patient on his R side. He did not LOC, he c/o slight headache, seizure pads applied to bed,patient resting quietly, provider made aware.

## 2022-12-03 NOTE — ED Notes (Signed)
Patient transported to CT 

## 2022-12-03 NOTE — H&P (Signed)
Andrew Castillo [id number] DOB: 1996-08-10 DOA: 12/03/2022     PCP: Girtha Rm, NP-C   Outpatient Specialists:      NEurology    Dr. April Manson   GI  Dr. Carlean Purl [facility], [facility])    Patient arrived to ER on 12/03/22 at 1448 Referred by Attending Toy Baker, MD   Patient coming from:    home Lives  With family    Chief Complaint:   Chief Complaint  Patient presents with   Abdominal Pain   Emesis    HPI: Andrew Castillo is a 27 y.o. male with medical history significant of cyclic voiming seizure like activity, GERD, anxiety    Presented with   nausea voming 3 day hx of intractable nausea and vomiting Has frequent episodes of the the same Pt was challenged with Ginger ale and immediatly vomited it up    seen GI and has gastritis and esophagitis   In ER pt thought to have seizures, not post icatal states he had a panic attack Reports had seizure-like activity 4 days ago Never lost consciousness was awake the whole time no loss of bowel bladder was confused for 1 minute thereafter no tongue biting Recently neurology has changed him to clobazam 10 mg p.o. twice daily but he has not filled it yet  He continues to smoke about a pack a day does not drink alcohol Reports some chills no fever no diarrhea eats at home have not had any questionable source of food    Lab Results  Component Value Date   [place] 07/28/2022   [place] Not Detected 08/08/2020   [place] Not Detected 03/07/2020   [place] NOT DETECTED 10/06/2019    Regarding pertinent Chronic problems:        Seizure DO - las seizure 4 days ago atypical  currently on Onfi 10 mg po bid and diazepam prn    While in ER: Clinical Course as of 12/03/22 2013  Mon Dec 03, 2022  1626 Nurse reported the patient had seizure-like activity however when I went to the patient's room he is resting comfortably.  He is not postictal.  There is no tongue bites or signs of seizure.  He told  me he had " panic attacks" and this happens when he feels anxious and has all the cords and everything on him in the bed. [VB]  [id number] Labs reviewed by me leukocytosis 14.6 with left shift could be acute phase reactant from vomiting.  Lipase within normal limits and no transaminitis doubt acute pancreatitis or biliary pathology.  Creatinine 1.03.  Glucose 103 no acute electrolyte abnormalities. [VB]  [id number] CTAP with contrast reviewed by me no evidence of bowel obstruction.  Read by radiology as nonspecific findings suggestive of enterocolitis.  Holding off from antibiotics as this has been a chronic issue and patient afebrile with no bloody diarrhea.  CT head unremarkable.  Reassessed patient he just vomited p.o. challenge of ginger ale immediately after drinking fluid.  Ordering more nausea medicines and reach out to hospitalist for admission. [VB]  1949 Patient admitted to Dr. Roel Cluck for intractable vomiting in the setting of enterocolitis. [VB]    Clinical Course User Index [VB] Elgie Congo, MD      Lab Orders         Comprehensive metabolic panel         CBC with Differential         Ethanol         Lipase,  blood         Urinalysis, Routine w reflex microscopic -Urine, Clean Catch         Rapid urine drug screen (hospital performed)      CT HEAD   NON acute    CTabd/pelvis - subtle entero colitis.     Following Medications were ordered in ER: Medications  lactated ringers infusion ( Intravenous New Bag/Given 12/03/22 1917)  lactated ringers bolus 1,000 mL (0 mLs Intravenous Stopped 12/03/22 1739)  haloperidol lactate (HALDOL) injection 2 mg (2 mg Intravenous Given 12/03/22 1606)  diphenhydrAMINE (BENADRYL) injection 12.5 mg (12.5 mg Intravenous Given 12/03/22 1605)  pantoprazole (PROTONIX) injection 40 mg (40 mg Intravenous Given 12/03/22 1605)  haloperidol lactate (HALDOL) injection 2 mg (2 mg Intravenous Given 12/03/22 1644)  iohexol (OMNIPAQUE) 300 MG/ML solution 100 mL (100  mLs Intravenous Contrast Given 12/03/22 1753)  metoCLOPramide (REGLAN) injection 5 mg (5 mg Intravenous Given 12/03/22 1919)       ED Triage Vitals  Enc Vitals Group     BP 12/03/22 1454 (!) 167/95     Pulse Rate 12/03/22 1454 97     Resp 12/03/22 1454 18     Temp 12/03/22 1454 98.9 F (37.2 C)     Temp Source 12/03/22 1454 Oral     SpO2 12/03/22 1454 97 %     Weight 12/03/22 1500 210 lb (95.3 kg)     Height 12/03/22 1500 6' (1.829 m)     Head Circumference --      Peak Flow --      Pain Score 12/03/22 1459 9     Pain Loc --      Pain Edu? --      Excl. in [place]? --   TMAX(24)@     _________________________________________ Significant initial  Findings: Abnormal Labs Reviewed  COMPREHENSIVE METABOLIC PANEL - Abnormal; Notable for the following components:      Result Value   Glucose, Bld 103 (*)    Calcium 8.7 (*)    All other components within normal limits  CBC WITH DIFFERENTIAL/PLATELET - Abnormal; Notable for the following components:   WBC 14.6 (*)    Neutro Abs 11.9 (*)    All other components within normal limits  URINALYSIS, ROUTINE W REFLEX MICROSCOPIC - Abnormal; Notable for the following components:   APPearance HAZY (*)    Ketones, ur 5 (*)    All other components within normal limits     ECG: Ordered Personally reviewed and interpreted by me showing: HR : 80 Rhythm:  NSR   no evidence of ischemic changes QTC 466  BNP (last 3 results) No results for input(s): "BNP" in the last 8760 hours.   COVID-19 Labs  No results for input(s): "DDIMER", "FERRITIN", "LDH", "CRP" in the last 72 hours.  Lab Results  Component Value Date   [place] NEGATIVE 07/28/2022   [place] Not Detected 08/08/2020   [place] Not Detected 03/07/2020   SARSCOV2NAA NOT DETECTED 10/06/2019     The recent clinical data is shown below. Vitals:   12/03/22 1454 12/03/22 1500 12/03/22 1921  BP: (!) 167/95  (!) 151/100  Pulse: 97  93  Resp: 18  16  Temp: 98.9 F (37.2 C)     TempSrc: Oral    SpO2: 97%  99%  Weight:  95.3 kg   Height:  6' (1.829 m)     WBC     Component Value Date/Time   WBC 14.6 (H) 12/03/2022 1556   LYMPHSABS  1.9 12/03/2022 1556   MONOABS 0.6 12/03/2022 1556   EOSABS 0.0 12/03/2022 1556   BASOSABS 0.0 12/03/2022 1556       UA   no evidence of UTI     Urine analysis:    Component Value Date/Time   COLORURINE YELLOW 12/03/2022 1707   APPEARANCEUR HAZY (A) 12/03/2022 1707   LABSPEC 1.012 12/03/2022 1707   PHURINE 8.0 12/03/2022 1707   GLUCOSEU NEGATIVE 12/03/2022 1707   GLUCOSEU NEGATIVE 05/04/2016 1052   HGBUR NEGATIVE 12/03/2022 1707   BILIRUBINUR NEGATIVE 12/03/2022 1707   BILIRUBINUR negative 11/12/2017 1319   KETONESUR 5 (A) 12/03/2022 1707   PROTEINUR NEGATIVE 12/03/2022 1707   UROBILINOGEN 0.2 11/12/2017 1319   UROBILINOGEN 1.0 05/04/2016 1052   NITRITE NEGATIVE 12/03/2022 1707   LEUKOCYTESUR NEGATIVE 12/03/2022 1707       __________________________________________________________ Recent Labs  Lab 11/29/22 2023 12/01/22 0009 12/03/22 1556  NA 139 140 138  K 4.4 4.0 3.8  CO2 22 24 26   GLUCOSE 89 141* 103*  BUN 10 9 13   CREATININE 075-GRM 1.02 1.03  CALCIUM 9.1 9.4 8.7*    Cr  stable,    Lab Results  Component Value Date   CREATININE 1.03 12/03/2022   CREATININE 1.02 12/01/2022   CREATININE 1.24 11/29/2022    Recent Labs  Lab 12/01/22 0009 12/03/22 1556  AST 21 20  ALT 22 21  ALKPHOS 60 55  BILITOT 1.2 1.0  PROT 7.9 7.3  ALBUMIN 4.9 4.5   Lab Results  Component Value Date   CALCIUM 8.7 (L) 12/03/2022    Plt: Lab Results  Component Value Date   PLT 272 12/03/2022       Recent Labs  Lab 11/29/22 2023 12/01/22 0009 12/03/22 1556  WBC 11.4* 15.0* 14.6*  NEUTROABS 9.2* 13.3* 11.9*  HGB 14.9 17.0 16.1  HCT 44.7 50.6 48.5  MCV 88.9 87.5 88.0  PLT 250 301 272    HG/HCT  stable,     Component Value Date/Time   HGB 16.1 12/03/2022 1556   HCT 48.5 12/03/2022 1556   MCV 88.0  12/03/2022 1556    Recent Labs  Lab 12/01/22 0009 12/03/22 1556  LIPASE 41 32   No results for input(s): "AMMONIA" in the last 168 hours.    .lab  _______________________________________________ Hospitalist was called for admission for   Nausea vomiting and diarrhea    Enterocolitis     The following Work up has been ordered so far:  Orders Placed This Encounter  Procedures   CT ABDOMEN PELVIS W CONTRAST   CT Head Wo Contrast   Comprehensive metabolic panel   CBC with Differential   Ethanol   Lipase, blood   Urinalysis, Routine w reflex microscopic -Urine, Clean Catch   Rapid urine drug screen (hospital performed)   Patient may eat/drink   Cardiac Monitoring Continuous x 24 hours Indications for use: Other; Other indications for use: dehydration   Consult to hospitalist   ED EKG   Place in observation (patient's expected length of stay will be less than 2 midnights)   Seizure precautions     OTHER Significant initial  Findings:  labs showing:     DM  labs:  HbA1C: Recent Labs    05/23/22 1205  HGBA1C 5.2      Cultures:    Component Value Date/Time   SDES  03/21/2022 1531    URINE, CLEAN CATCH Performed at [facility], [address], [place], [place] [address]  SPECREQUEST  03/21/2022 1531    NONE Performed at [facility], [address] [place], [place] [address]    CULT  03/21/2022 1531    NO GROWTH Performed at [facility] [address], [place], [place] [address]    REPTSTATUS 03/22/2022 FINAL 03/21/2022 1531     Radiological Exams on Admission: CT ABDOMEN PELVIS W CONTRAST  Result Date: 12/03/2022 CLINICAL DATA:  Diffuse pain.  Vomiting EXAM: CT ABDOMEN AND PELVIS WITH CONTRAST TECHNIQUE: Multidetector CT imaging of the abdomen and pelvis was performed using the standard protocol following bolus administration of intravenous contrast. RADIATION DOSE REDUCTION: This exam was performed  according to the departmental dose-optimization program which includes automated exposure control, adjustment of the mA and/or kV according to patient size and/or use of iterative reconstruction technique. CONTRAST:  178mL OMNIPAQUE IOHEXOL 300 MG/ML  SOLN COMPARISON:  CT 07/28/2022 and older FINDINGS: Lower chest: There is some breathing motion noted along the lung bases. No pleural effusion. Trace pericardial fluid. Small hiatal hernia. Hepatobiliary: The gallbladder is contracted. Patent portal vein. No space-occupying liver lesion. Pancreas: Unremarkable. No pancreatic ductal dilatation or surrounding inflammatory changes. Spleen: The spleen is nonenlarged. Preserved enhancement. Small splenule. Adrenals/Urinary Tract: The adrenal glands are preserved. No enhancing renal mass or collecting system dilatation. Tiny cystic focus posteriorly along the right mid kidney measuring 6 mm, unchanged from prior unlikely a benign cysts, Bosniak 2 lesion. No specific imaging follow-up. The ureters have a normal course and caliber extending down to the bladder. Preserved contour to the urinary bladder. Stomach/Bowel: There is moderate stool in the rectum. More proximally the colon is nondilated but has diffuse wall thickening. The wall thickening could relate to level of distention but a subtle colitis is also possible. Normal appendix extends medial to the cecum in the right lower quadrant. The stomach is nondilated. The small bowel is nondilated. There is also some subtle wall thickening along the small bowel. Vascular/Lymphatic: Normal caliber aorta and IVC. No specific abnormal lymph node enlargement identified in the abdomen and pelvis. The previous nodes in the right lower quadrant mesentery are again seen but appears smaller today measuring up to 8 mm in diameter such as series 4, image 48. Reproductive: Prostate is unremarkable. Other: No abdominal wall hernia or abnormality. No abdominopelvic ascites.  Musculoskeletal: Slight disc bulge at L5-S1. IMPRESSION: No obstruction or free air. Normal appendix. However there are some subtle areas of wall thickening diffusely along the decompressed colon as well as a few areas along the small bowel. Please correlate for a subtle entero colitis. The previous nodes in the mesentery are again seen but appears slightly smaller today. No ascites. Small hiatal hernia Electronically Signed   By: Jill Side M.D.   On: 12/03/2022 18:44   CT Head Wo Contrast  Result Date: 12/03/2022 CLINICAL DATA:  Vomiting, seizures EXAM: CT HEAD WITHOUT CONTRAST TECHNIQUE: Contiguous axial images were obtained from the base of the skull through the vertex without intravenous contrast. RADIATION DOSE REDUCTION: This exam was performed according to the departmental dose-optimization program which includes automated exposure control, adjustment of the mA and/or kV according to patient size and/or use of iterative reconstruction technique. COMPARISON:  11/19/2022 FINDINGS: Brain: No evidence of acute infarction, hemorrhage, mass, mass effect, or midline shift. No hydrocephalus or extra-axial fluid collection. Vascular: No hyperdense vessel. Skull: Negative for fracture or focal lesion. Sinuses/Orbits: Mild mucosal thickening in the ethmoid air cells and inferior frontal sinuses. No acute finding in the orbits.  Other: The mastoid air cells are well aerated. IMPRESSION: No acute intracranial process. Electronically Signed   By: Merilyn Baba M.D.   On: 12/03/2022 18:38   _______________________________________________________________________________________________________ Latest  Blood pressure (!) 151/100, pulse 93, temperature 98.9 F (37.2 C), temperature source Oral, resp. rate 16, height 6' (1.829 m), weight 95.3 kg, SpO2 99 %.   Vitals  labs and radiology finding personally reviewed  Review of Systems:    Pertinent positives include:  indigestion, abdominal pain, nausea,  vomiting,  Constitutional:  No weight loss, night sweats, Fevers, chills, fatigue, weight loss  HEENT:  No headaches, Difficulty swallowing,Tooth/dental problems,Sore throat,  No sneezing, itching, ear ache, nasal congestion, post nasal drip,  Cardio-vascular:  No chest pain, Orthopnea, PND, anasarca, dizziness, palpitations.no Bilateral lower extremity swelling  GI:  No heartburn,  diarrhea, change in bowel habits, loss of appetite, melena, blood in stool, hematemesis Resp:  no shortness of breath at rest. No dyspnea on exertion, No excess mucus, no productive cough, No non-productive cough, No coughing up of blood.No change in color of mucus.No wheezing. Skin:  no rash or lesions. No jaundice GU:  no dysuria, change in color of urine, no urgency or frequency. No straining to urinate.  No flank pain.  Musculoskeletal:  No joint pain or no joint swelling. No decreased range of motion. No back pain.  Psych:  No change in mood or affect. No depression or anxiety. No memory loss.  Neuro: no localizing neurological complaints, no tingling, no weakness, no double vision, no gait abnormality, no slurred speech, no confusion  All systems reviewed and apart from [place] all are negative _______________________________________________________________________________________________ Past Medical History:   Past Medical History:  Diagnosis Date   Childhood asthma    Environmental allergies    GERD (gastroesophageal reflux disease)    Headache    migraines   Seasonal allergic conjunctivitis    Seizures ([place])    caused by flexiril 01/2022      Past Surgical History:  Procedure Laterality Date   ESOPHAGOGASTRODUODENOSCOPY     Left Knee Menuiscus     "several surguries on same knee"   NM ESOPHAGEAL REFLUX     Right Shoulder Surgery     TONSILLECTOMY     adenoids   UPPER GI ENDOSCOPY      Social History:  Ambulatory   independently      reports that he has been smoking  cigarettes. He has a 1.00 pack-year smoking history. He has never used smokeless tobacco. He reports that he does not currently use alcohol. He reports current drug use. Drug: Marijuana.     Family History:   Family History  Problem Relation Age of Onset   Fibromyalgia Mother    Thyroid disease Mother        Living   Lupus Mother    Anxiety disorder Mother    Clotting disorder Mother    Crohn's disease Mother    Hypertension Father    Hyperlipidemia Father        Living   Liver disease Father    Anxiety disorder Sister    ADD / ADHD Brother    Seizures Brother    Anxiety disorder Maternal Aunt    Depression Maternal Aunt    Lupus Maternal Aunt    Diabetes Paternal Aunt    Lupus Paternal Aunt    Fibromyalgia Paternal Aunt    Drug abuse Paternal Uncle    Diabetes Maternal Grandmother    Hypertension Maternal Grandmother  Liver disease Maternal Grandmother    Bone cancer Paternal Grandmother    Heart disease Paternal Grandfather    Diabetes Paternal Grandfather    Migraines Other        Various   Colon cancer Neg Hx    Esophageal cancer Neg Hx    Rectal cancer Neg Hx    Stomach cancer Neg Hx    ______________________________________________________________________________________________ Allergies: Allergies  Allergen Reactions   Penicillins Hives    Did it involve swelling of the face/tongue/throat, SOB, or low BP? Unknown Did it involve sudden or severe rash/hives, skin peeling, or any reaction on the inside of your mouth or nose? Unknown Did you need to seek medical attention at a hospital or doctor's office? Unknown When did it last happen?   Pt doesn't remember he said he was a child    If all above answers are "NO", may proceed with cephalosporin use.    Flexeril [Cyclobenzaprine]     seizures     Prior to Admission medications   Medication Sig Start Date End Date Taking? Authorizing Provider  cloBAZam (ONFI) 10 MG tablet Take 1 tablet (10 mg total) by  mouth 2 (two) times daily. 11/30/22 05/29/23  Alric Ran, MD  diazePAM, 15 MG Dose, (VALTOCO 15 MG DOSE) 2 x 7.5 MG/0.1ML LQPK Place 15 mg into the nose as needed (For seizure lasting more than 5 minutes or for seizure clusters). 11/20/22   Alric Ran, MD  dicyclomine (BENTYL) 10 MG capsule Take 1 capsule (10 mg total) by mouth 4 (four) times daily -  before meals and at bedtime. Patient taking differently: Take 10 mg by mouth daily as needed for spasms. 04/05/22   Willia Craze, NP  fluticasone (FLONASE) 50 MCG/ACT nasal spray SPRAY 2 SPRAYS INTO EACH NOSTRIL EVERY DAY Patient taking differently: Place 2 sprays into both nostrils daily as needed for allergies. 08/22/22   [name], [name], NP-C  haloperidol (HALDOL) 5 MG tablet Take 1 tablet (5 mg total) by mouth every 6 (six) hours as needed (nausea, vomiting). 10/31/22   Gatha Mayer, MD  levocetirizine (XYZAL) 5 MG tablet TAKE 1 TABLET BY MOUTH EVERY DAY IN THE EVENING Patient taking differently: Take 5 mg by mouth daily as needed for allergies. 08/22/22   [name], [name], NP-C  ondansetron (ZOFRAN) 8 MG tablet Take 1 tablet (8 mg total) by mouth every 8 (eight) hours as needed for nausea or vomiting. 10/21/22   Pattricia Boss, MD  ondansetron (ZOFRAN-ODT) 8 MG disintegrating tablet Take 1 tablet (8 mg total) by mouth every 8 (eight) hours as needed for nausea or vomiting. 0000000   Delora Fuel, MD  pantoprazole (PROTONIX) 40 MG tablet Take 1 tablet (40 mg total) by mouth 2 (two) times daily before a meal. 08/02/22   Cirigliano, Vito V, DO  prochlorperazine (COMPAZINE) 25 MG suppository Place 1 suppository (25 mg total) rectally every 12 (twelve) hours as needed for nausea or vomiting. 10/21/22   Pattricia Boss, MD  promethazine (PHENERGAN) 25 MG suppository Place 1 suppository (25 mg total) rectally every 6 (six) hours as needed for nausea or vomiting. 08/02/22   Cirigliano, Vito V, DO  sucralfate (CARAFATE) 1 GM/10ML suspension Take 10 mLs (1 g  total) by mouth 4 (four) times daily -  with meals and at bedtime. 09/13/22 01/11/23  Gatha Mayer, MD    ___________________________________________________________________________________________________ Physical Exam:    12/03/2022    7:21 PM 12/03/2022    3:00 PM 12/03/2022  2:54 PM  Vitals with BMI  Height  6\' 0"    Weight  210 lbs   BMI  [id number]   Systolic [id number]  [id number]  Diastolic 123XX123  95  Pulse 93  97     1. General:  in No  Acute distress    Chronically ill   -appearing 2. Psychological: Alert and   Oriented 3. Head/ENT:   ry Mucous Membranes                          Head Non traumatic, neck supple                          Poor Dentition 4. SKIN:  decreased Skin turgor,  Skin clean Dry and intact no rash 5. Heart: Regular rate and rhythm no  Murmur, no Rub or gallop 6. Lungs:  no wheezes or crackles   7. Abdomen: Soft,  diffuse tender, Non distended bowel sounds present 8. Lower extremities: no clubbing, cyanosis, no  edema 9. Neurologically Grossly intact, moving all 4 extremities equally   10. MSK: Normal range of motion    Chart has been reviewed  ______________________________________________________________________________________________  Assessment/Plan 27 y.o. male with medical history significant of cyclic voiming seizure like activity, GERD, anxiety   Admitted for    Nausea vomiting and diarrhea    Enterocolitis      Present on Admission:  Enterocolitis  Tobacco abuse  Intractable nausea and vomiting     Enterocolitis Bowel rest, Cipro Flagyl antibiotics IV, Supportive management [facility] will be notified for consult  Tobacco abuse  - Spoke about importance of quitting spent 5 minutes discussing options for treatment, prior attempts at quitting, and dangers of smoking  -At this point patient is    interested in quitting  - order nicotine patch   - nursing tobacco cessation protocol   Hx of seizure disorder In the setting of recurrent  cocaine use has been followed by neurology had a EEG done that showed left frontal slowing but no epileptiform discharge was supposed to get MRI did not get done yet.  Last seizure-like activity was on first day today had a seizure-like activity but states it was a panic attack.  Continue home medications Onfi 10 mg po bid  Repeat EEG if abnormal will likely need neurology consult given that patient continues to use cocaine this puts him at high risk of recurrent events.   Intractable nausea and vomiting Recurrent in the setting of cannabinoid use and cocaine. Supportive measures try Haldol appreciate GI consult sent message to Dr. Havery Moros of [facility]    Other plan as per orders.  DVT prophylaxis:  SCD     Code Status:    Code Status: Prior FULL CODE  as per patient    I had personally discussed CODE STATUS with patient       Family Communication:   Family   at  Bedside  plan of care was discussed with SO  Disposition Plan:    To home once workup is complete and patient is stable   Following barriers for discharge:                                                      Will need  to be able to tolerate PO                                                       Will need consultants to evaluate patient prior to discharge    Consults called: sent msg to [facility]  Admission status:  ED Disposition     ED Disposition  Admit   Condition  --   [place]: [facility] [[id number]]  Level of Care: Telemetry [5]  Admit to tele based on following criteria: Other see comments  Comments: dehydration  May place patient in observation at [facility] or [facility] if equivalent level of care is available:: No  Covid Evaluation: Asymptomatic - no recent exposure (last 10 days) testing not required  Diagnosis: Enterocolitis [id number]  Admitting Physician: Toy Baker [[id number]]  Attending Physician: Toy Baker [[id number]]           Obs       Level  of care     tele  For 12H      Loukisha Gunnerson 12/03/2022, 9:08 PM    [facility]     after 2 AM please page floor coverage PA If 7AM-7PM, please contact the day team taking care of the patient using [email or web address]

## 2022-12-03 NOTE — Subjective & Objective (Signed)
3 day hx of intractable nausea and vomiting Has frequent episodes of the the same Pt was challenged with Ginger ale and immediatly vomited it up

## 2022-12-03 NOTE — ED Triage Notes (Signed)
Vomiting for months with generalized abdominal pain. Pt has seen GI and has gastritis and esophagitis. No other issues. Pt states he throws up almost everyday multiple times a day.

## 2022-12-03 NOTE — Assessment & Plan Note (Addendum)
In the setting of recurrent cocaine use has been followed by neurology had a EEG done that showed left frontal slowing but no epileptiform discharge was supposed to get MRI did not get done yet.  Last seizure-like activity was on first day today had a seizure-like activity but states it was a panic attack.  Continue home medications Onfi 10 mg po bid  Repeat EEG if abnormal will likely need neurology consult given that patient continues to use cocaine this puts him at high risk of recurrent events.

## 2022-12-03 NOTE — Assessment & Plan Note (Signed)
Likely contributing to elevated blood pressure nausea vomiting seizure-like activities.  And significant anxiety.  Will need to discuss with patient prior to discharge need for discontinuation

## 2022-12-04 ENCOUNTER — Observation Stay (HOSPITAL_COMMUNITY)
Admit: 2022-12-04 | Discharge: 2022-12-04 | Disposition: A | Discharge status: Home / Self Care | Payer: BC Managed Care – PPO | Attending: Internal Medicine | Admitting: Internal Medicine

## 2022-12-04 ENCOUNTER — Inpatient Hospital Stay: Payer: BC Managed Care – PPO | Admitting: Family Medicine

## 2022-12-04 DIAGNOSIS — R197 Diarrhea, unspecified: Secondary | ICD-10-CM | POA: Diagnosis not present

## 2022-12-04 DIAGNOSIS — R112 Nausea with vomiting, unspecified: Secondary | ICD-10-CM | POA: Diagnosis not present

## 2022-12-04 DIAGNOSIS — R569 Unspecified convulsions: Secondary | ICD-10-CM

## 2022-12-04 DIAGNOSIS — F1210 Cannabis abuse, uncomplicated: Secondary | ICD-10-CM | POA: Diagnosis not present

## 2022-12-04 DIAGNOSIS — F1410 Cocaine abuse, uncomplicated: Secondary | ICD-10-CM | POA: Diagnosis not present

## 2022-12-04 LAB — COMPREHENSIVE METABOLIC PANEL
ALT: 17 U/L (ref 0–44)
AST: 16 U/L (ref 15–41)
Albumin: 3.9 g/dL (ref 3.5–5.0)
Alkaline Phosphatase: 47 U/L (ref 38–126)
Anion gap: 9 (ref 5–15)
BUN: 9 mg/dL (ref 6–20)
CO2: 25 mmol/L (ref 22–32)
Calcium: 8.2 mg/dL — ABNORMAL LOW (ref 8.9–10.3)
Chloride: 103 mmol/L (ref 98–111)
Creatinine, Ser: 0.94 mg/dL (ref 0.61–1.24)
GFR, Estimated: >60 mL/min (ref >60)
Glucose, Bld: 94 mg/dL (ref 70–99)
Potassium: 3.5 mmol/L (ref 3.5–5.1)
Sodium: 137 mmol/L (ref 135–145)
Total Bilirubin: 1 mg/dL (ref 0.3–1.2)
Total Protein: 6.3 g/dL — ABNORMAL LOW (ref 6.5–8.1)

## 2022-12-04 LAB — MAGNESIUM: Magnesium: 2.2 mg/dL (ref 1.7–2.4)

## 2022-12-04 LAB — CBC
HCT: 46 % (ref 39.0–52.0)
Hemoglobin: 15.3 g/dL (ref 13.0–17.0)
MCH: 29.4 pg (ref 26.0–34.0)
MCHC: 33.3 g/dL (ref 30.0–36.0)
MCV: 88.5 fL (ref 80.0–100.0)
Platelets: 236 10*3/uL (ref 150–400)
RBC: 5.2 MIL/uL (ref 4.22–5.81)
RDW: 12.4 % (ref 11.5–15.5)
WBC: 11.1 10*3/uL — ABNORMAL HIGH (ref 4.0–10.5)
nRBC: 0 % (ref 0.0–0.2)

## 2022-12-04 LAB — PREALBUMIN: Prealbumin: 28 mg/dL (ref 18–38)

## 2022-12-04 LAB — PHOSPHORUS: Phosphorus: 4.4 mg/dL (ref 2.5–4.6)

## 2022-12-04 LAB — LACTIC ACID, PLASMA: Lactic Acid, Venous: 1.1 mmol/L (ref 0.5–1.9)

## 2022-12-04 LAB — HIV ANTIBODY (ROUTINE TESTING W REFLEX): HIV Screen 4th Generation wRfx: NONREACTIVE

## 2022-12-04 MED ORDER — ENSURE ENLIVE PO LIQD
237.0000 mL | Freq: Two times a day (BID) | ORAL | Status: DC
  Administered 2022-12-04: 237 mL via ORAL

## 2022-12-04 NOTE — Discharge Summary (Signed)
Physician Discharge Summary   Patient: Andrew Castillo MRN: [id number] DOB: 22-Oct-1995  Admit date:     12/03/2022  Discharge date: 12/04/22  Discharge Physician: Lorella Nimrod   PCP: Girtha Rm, NP-C   Recommendations at discharge:  Please obtain CBC and BMP in 1 week Follow-up with primary care provider Follow-up with gastroenterology  Discharge Diagnoses: Principal Problem:   Enterocolitis Active Problems:   Nausea vomiting and diarrhea   Tobacco abuse   Hx of seizure disorder   Cocaine abuse [facility])   Hospital Course: Taken from H&P.  Andrew Castillo is a 27 y.o. male with medical history significant of cyclic voiming, seizure like activity, cannabinoid and cocaine use GERD, anxiety presented with nausea and vomiting for 3 days. Patient has seen GI and has gastritis and esophagitis. In ER he was also concern about seizure-like activity 4 days ago, might have a panic attack.  No postictal state.  He never lost any consciousness, no incontinence. Recently neurology has changed him to low was seen 10 mg twice daily but he has not filled it yet.  Denies any alcohol intake.  ED course.  Vital stable.  Labs with leukocytosis at 14.6, electrolytes, lactic acid , TSH normal.  UDS positive for cocaine and tetrahydrocannabinol, UA negative for UTI, procalcitonin negative. CT head without any acute abnormality. CT abdomen and pelvis with possible enterocolitis.  Patient was started on Cipro and Flagyl.  3/19; vitals and labs stable, improving leukocytosis.  GI pathogen panel pending. Patient with improved symptoms and wants to go home.  Gastroenterologist saw him and does not think that he has any infectious colitis, most likely secondary to cocaine use. And cyclical nausea and vomiting with cannabinoid use.  Patient was counseled again.  Initially they were recommending follow-up EGD for his prior esophagitis but patient would like to continue follow-up with his  gastroenterologist as an outpatient and was requesting discharge.  Patient will continue with rest of his home medications and need to have a close follow-up with his providers.  He will need continuation of counseling against illicit drug use.         Consultants: Gastroenterology Procedures performed: None Disposition: Home Diet recommendation:  Discharge Diet Orders (From admission, onward)     Start     Ordered   12/04/22 0000  Diet - low sodium heart healthy        12/04/22 1539           Cardiac diet DISCHARGE MEDICATION: Allergies as of 12/04/2022       Reactions   Penicillins Hives   Did it involve swelling of the face/tongue/throat, SOB, or low BP? Unknown Did it involve sudden or severe rash/hives, skin peeling, or any reaction on the inside of your mouth or nose? Unknown Did you need to seek medical attention at a hospital or doctor's office? Unknown When did it last happen?   Pt doesn't remember he said he was a child    If all above answers are "NO", may proceed with cephalosporin use.   Flexeril [cyclobenzaprine]    seizures        Medication List     STOP taking these medications    ondansetron 8 MG disintegrating tablet Commonly known as: ZOFRAN-ODT   promethazine 25 MG suppository Commonly known as: PHENERGAN       TAKE these medications    Aptiom 600 MG Tabs Generic drug: Eslicarbazepine Acetate Take 1 tablet by mouth every morning.   cloBAZam 10  MG tablet Commonly known as: ONFI Take 1 tablet (10 mg total) by mouth 2 (two) times daily.   dicyclomine 10 MG capsule Commonly known as: BENTYL Take 1 capsule (10 mg total) by mouth 4 (four) times daily -  before meals and at bedtime. What changed:  when to take this reasons to take this   fluticasone 50 MCG/ACT nasal spray Commonly known as: FLONASE SPRAY 2 SPRAYS INTO EACH NOSTRIL EVERY DAY What changed: See the new instructions.   haloperidol 5 MG tablet Commonly known as:  HALDOL Take 1 tablet (5 mg total) by mouth every 6 (six) hours as needed (nausea, vomiting).   hydrOXYzine 25 MG tablet Commonly known as: ATARAX Take 25 mg by mouth 3 (three) times daily as needed for nausea or vomiting.   levocetirizine 5 MG tablet Commonly known as: XYZAL TAKE 1 TABLET BY MOUTH EVERY DAY IN THE EVENING What changed:  how much to take how to take this when to take this reasons to take this   naproxen 500 MG tablet Commonly known as: NAPROSYN Take 500 mg by mouth 2 (two) times daily as needed for moderate pain.   ondansetron 8 MG tablet Commonly known as: Zofran Take 1 tablet (8 mg total) by mouth every 8 (eight) hours as needed for nausea or vomiting.   pantoprazole 40 MG tablet Commonly known as: PROTONIX Take 1 tablet (40 mg total) by mouth 2 (two) times daily before a meal.   prochlorperazine 25 MG suppository Commonly known as: COMPAZINE Place 1 suppository (25 mg total) rectally every 12 (twelve) hours as needed for nausea or vomiting.   sucralfate 1 GM/10ML suspension Commonly known as: CARAFATE Take 10 mLs (1 g total) by mouth 4 (four) times daily -  with meals and at bedtime.   Valtoco 15 MG Dose 2 x 7.5 MG/0.1ML Lqpk Generic drug: diazePAM (15 MG Dose) Place 15 mg into the nose as needed (For seizure lasting more than 5 minutes or for seizure clusters).        Follow-up Information     [name], [name], NP-C .   Specialty: Family Medicine Contact information: [place] [address] [phone]                Discharge Exam: Danley Danker Weights   12/03/22 1500  Weight: 95.3 kg   General.     In no acute distress. Pulmonary.  Lungs clear bilaterally, normal respiratory effort. CV.  Regular rate and rhythm, no JVD, rub or murmur. Abdomen.  Soft, nontender, nondistended, BS positive. CNS.  Alert and oriented .  No focal neurologic deficit. Extremities.  No edema, no cyanosis, pulses intact and  symmetrical. Psychiatry.  Judgment and insight appears normal.   Condition at discharge: stable  The results of significant diagnostics from this hospitalization (including imaging, microbiology, ancillary and laboratory) are listed below for reference.   Imaging Studies: EEG adult  Result Date: 12-27-22 Lora Havens, MD     12-27-22 12:08 PM Patient Name: Andrew Castillo MRN: [id number] Epilepsy Attending: Lora Havens Referring Physician/Provider: Toy Baker, MD Date: December 27, 2022 Duration: 21.17 mins Patient history: 27yo M with seizure in setting of cocaine use. EEG to evaluate for seizure Level of alertness: Awake AEDs during EEG study: Onfi Technical aspects: This EEG study was done with scalp electrodes positioned according to the 10-20 International system of electrode placement. Electrical activity was reviewed with band pass filter of 1-70Hz , sensitivity of 7 uV/mm, display speed of  51mm/sec with a 60Hz  notched filter applied as appropriate. EEG data were recorded continuously and digitally stored.  Video monitoring was available and reviewed as appropriate. Description: The posterior dominant rhythm consists of 10 Hz activity of moderate voltage (25-35 uV) seen predominantly in posterior head regions, symmetric and reactive to eye opening and eye closing. Physiologic photic driving was seen during photic stimulation.  Hyperventilation was not performed.   IMPRESSION: This study is within normal limits. No seizures or epileptiform discharges were seen throughout the recording. A normal interictal EEG does not exclude the diagnosis of epilepsy. Lora Havens   CT ABDOMEN PELVIS W CONTRAST  Result Date: 12/03/2022 CLINICAL DATA:  Diffuse pain.  Vomiting EXAM: CT ABDOMEN AND PELVIS WITH CONTRAST TECHNIQUE: Multidetector CT imaging of the abdomen and pelvis was performed using the standard protocol following bolus administration of intravenous contrast. RADIATION DOSE  REDUCTION: This exam was performed according to the departmental dose-optimization program which includes automated exposure control, adjustment of the mA and/or kV according to patient size and/or use of iterative reconstruction technique. CONTRAST:  144mL OMNIPAQUE IOHEXOL 300 MG/ML  SOLN COMPARISON:  CT 07/28/2022 and older FINDINGS: Lower chest: There is some breathing motion noted along the lung bases. No pleural effusion. Trace pericardial fluid. Small hiatal hernia. Hepatobiliary: The gallbladder is contracted. Patent portal vein. No space-occupying liver lesion. Pancreas: Unremarkable. No pancreatic ductal dilatation or surrounding inflammatory changes. Spleen: The spleen is nonenlarged. Preserved enhancement. Small splenule. Adrenals/Urinary Tract: The adrenal glands are preserved. No enhancing renal mass or collecting system dilatation. Tiny cystic focus posteriorly along the right mid kidney measuring 6 mm, unchanged from prior unlikely a benign cysts, Bosniak 2 lesion. No specific imaging follow-up. The ureters have a normal course and caliber extending down to the bladder. Preserved contour to the urinary bladder. Stomach/Bowel: There is moderate stool in the rectum. More proximally the colon is nondilated but has diffuse wall thickening. The wall thickening could relate to level of distention but a subtle colitis is also possible. Normal appendix extends medial to the cecum in the right lower quadrant. The stomach is nondilated. The small bowel is nondilated. There is also some subtle wall thickening along the small bowel. Vascular/Lymphatic: Normal caliber aorta and IVC. No specific abnormal lymph node enlargement identified in the abdomen and pelvis. The previous nodes in the right lower quadrant mesentery are again seen but appears smaller today measuring up to 8 mm in diameter such as series 4, image 48. Reproductive: Prostate is unremarkable. Other: No abdominal wall hernia or abnormality. No  abdominopelvic ascites. Musculoskeletal: Slight disc bulge at L5-S1. IMPRESSION: No obstruction or free air. Normal appendix. However there are some subtle areas of wall thickening diffusely along the decompressed colon as well as a few areas along the small bowel. Please correlate for a subtle entero colitis. The previous nodes in the mesentery are again seen but appears slightly smaller today. No ascites. Small hiatal hernia Electronically Signed   By: Jill Side M.D.   On: 12/03/2022 18:44   CT Head Wo Contrast  Result Date: 12/03/2022 CLINICAL DATA:  Vomiting, seizures EXAM: CT HEAD WITHOUT CONTRAST TECHNIQUE: Contiguous axial images were obtained from the base of the skull through the vertex without intravenous contrast. RADIATION DOSE REDUCTION: This exam was performed according to the departmental dose-optimization program which includes automated exposure control, adjustment of the mA and/or kV according to patient size and/or use of iterative reconstruction technique. COMPARISON:  11/19/2022 FINDINGS: Brain: No evidence of acute infarction,  hemorrhage, mass, mass effect, or midline shift. No hydrocephalus or extra-axial fluid collection. Vascular: No hyperdense vessel. Skull: Negative for fracture or focal lesion. Sinuses/Orbits: Mild mucosal thickening in the ethmoid air cells and inferior frontal sinuses. No acute finding in the orbits. Other: The mastoid air cells are well aerated. IMPRESSION: No acute intracranial process. Electronically Signed   By: Merilyn Baba M.D.   On: 12/03/2022 18:38   CT Head Wo Contrast  Result Date: 11/19/2022 CLINICAL DATA:  Headache EXAM: CT HEAD WITHOUT CONTRAST TECHNIQUE: Contiguous axial images were obtained from the base of the skull through the vertex without intravenous contrast. RADIATION DOSE REDUCTION: This exam was performed according to the departmental dose-optimization program which includes automated exposure control, adjustment of the mA and/or kV  according to patient size and/or use of iterative reconstruction technique. COMPARISON:  None Available. FINDINGS: Brain: No evidence of acute infarction, hemorrhage, hydrocephalus, extra-axial collection or mass lesion/mass effect. Vascular: No hyperdense vessel or unexpected calcification. Skull: Normal. Negative for fracture or focal lesion. Sinuses/Orbits: Middle ear or mastoid effusion. Pansinus mucosal thickening, greatest in the left frontal sinus. Visualized orbits are unremarkable. Other: None. IMPRESSION: 1. No acute intracranial abnormality. 2. Pansinus mucosal thickening. Electronically Signed   By: Marin Roberts M.D.   On: 11/19/2022 08:46   MR BRAIN W WO CONTRAST  Result Date: 11/14/2022  [facility] [address], [place], [place] [address] [phone] NEUROIMAGING REPORT STUDY DATE: 11/14/2022 PATIENT NAME: Andrew Castillo DOB: September 11, 1996 MRN: [id number] ORDERING CLINICIAN: Dr. April Manson CLINICAL HISTORY: 27 year old patient evaluated for seizures COMPARISON FILMS: None available EXAM: MRI brain with and without contrast TECHNIQUE: Sagittal T1, axial T1, T2, FLAIR, DWI, ADC map, SWI, coronal T2 and FLAIR as well as postcontrast axial and coronal T1 images were obtained through the brain CONTRAST: 8 mm IV gadopiclenol IMAGING SITE: [facility] FINDINGS: The brain parenchyma shows no abnormal signal intensities.  No structural lesion, tumor or infarcts are noted.  Diffusion-weighted imaging is negative for acute ischemia.  SWI sequences do not show any microhemorrhages.  Subarachnoid spaces and ventricular systems appear normal.  Cortical sulci and gyri show normal appearance.  Extra-axial brain structures appear normal.  Calvarium shows no abnormalities.  Orbits appear unremarkable.  Paranasal sinuses show mucous retention cyst/polyps in both maxillary antrum of the right bigger than left.  Pituitary gland and cerebellar tonsils appear normal.  Visualized portion of  the cervical spine and craniovertebral junction showed no abnormalities.  Flow-voids of large vessel intracranial circulation appear to be patent.  Postcontrast images do not result in abnormal areas of enhancement.  Thin coronal sections through medial temporal lobes shows symmetric appearance of both hippocampi without evidence of tumors, cyst or sclerosis noted.   Unremarkable MRI scan of the brain with and without contrast.  Incidental mucous retention cyst/polyps are noted in both maxillary antrum. INTERPRETING PHYSICIAN: Antony Contras, MD Certified in  [place] by [place] of Neuro[facility] and [place] for Neurological Subspecialities   Microbiology: Results for orders placed or performed during the hospital encounter of 07/28/22  Resp Panel by RT-PCR (Flu A&B, Covid) Anterior Nasal Swab     Status: None   Collection Time: 07/28/22 10:09 PM   Specimen: Anterior Nasal Swab  Result Value Ref Range Status   SARS Coronavirus 2 by RT PCR NEGATIVE NEGATIVE Final    Comment: (NOTE) SARS-CoV-2 target nucleic acids are NOT DETECTED.  The SARS-CoV-2 RNA is generally detectable in upper respiratory specimens during the acute phase  of infection. The lowest concentration of SARS-CoV-2 viral copies this assay can detect is 138 copies/mL. A negative result does not preclude SARS-Cov-2 infection and should not be used as the sole basis for treatment or other patient management decisions. A negative result may occur with  improper specimen collection/handling, submission of specimen other than nasopharyngeal swab, presence of viral mutation(s) within the areas targeted by this assay, and inadequate number of viral copies(<138 copies/mL). A negative result must be combined with clinical observations, patient history, and epidemiological information. The expected result is Negative.  Fact Sheet for Patients:  EntrepreneurPulse.com.au  Fact Sheet for Healthcare  Providers:  IncredibleEmployment.be  This test is no t yet approved or cleared by the [place] FDA and  has been authorized for detection and/or diagnosis of SARS-CoV-2 by FDA under an Emergency Use Authorization (EUA). This EUA will remain  in effect (meaning this test can be used) for the duration of the COVID-19 declaration under Section 564(b)(1) of the Act, 21 U.S.C.section 360bbb-3(b)(1), unless the authorization is terminated  or revoked sooner.       Influenza A by PCR NEGATIVE NEGATIVE Final   Influenza B by PCR NEGATIVE NEGATIVE Final    Comment: (NOTE) The Xpert Xpress SARS-CoV-2/FLU/RSV plus assay is intended as an aid in the diagnosis of influenza from Nasopharyngeal swab specimens and should not be used as a sole basis for treatment. Nasal washings and aspirates are unacceptable for Xpert Xpress SARS-CoV-2/FLU/RSV testing.  Fact Sheet for Patients: EntrepreneurPulse.com.au  Fact Sheet for Healthcare Providers: IncredibleEmployment.be  This test is not yet approved or cleared by the [place] FDA and has been authorized for detection and/or diagnosis of SARS-CoV-2 by FDA under an Emergency Use Authorization (EUA). This EUA will remain in effect (meaning this test can be used) for the duration of the COVID-19 declaration under Section 564(b)(1) of the Act, 21 U.S.C. section 360bbb-3(b)(1), unless the authorization is terminated or revoked.  Performed at [facility] [place] [address] [place], [place] [address]     Labs: CBC: Recent Labs  Lab 11/29/22 2023 12/01/22 0009 12/03/22 1556 12/04/22 0533  WBC 11.4* 15.0* 14.6* 11.1*  NEUTROABS 9.2* 13.3* 11.9*  --   HGB 14.9 17.0 16.1 15.3  HCT 44.7 50.6 48.5 46.0  MCV 88.9 87.5 88.0 88.5  PLT 250 301 272 [id number]   Basic Metabolic Panel: Recent Labs  Lab 11/29/22 2023 12/01/22 0009 12/03/22 1556 12/04/22 0533  NA 139 140 138  137  K 4.4 4.0 3.8 3.5  CL 104 106 103 103  CO2 22 24 26 25   GLUCOSE 89 141* 103* 94  BUN 10 9 13 9   CREATININE 1.24 1.02 1.03 0.94  CALCIUM 9.1 9.4 8.7* 8.2*  MG  --   --  2.2 2.2  PHOS  --   --  3.5 4.4   Liver Function Tests: Recent Labs  Lab 12/01/22 0009 12/03/22 1556 12/04/22 0533  AST 21 20 16   ALT 22 21 17   ALKPHOS 60 55 47  BILITOT 1.2 1.0 1.0  PROT 7.9 7.3 6.3*  ALBUMIN 4.9 4.5 3.9   CBG: Recent Labs  Lab 11/29/22 2003  GLUCAP 96    Discharge time spent: greater than 30 minutes.  This record has been created using Systems analyst. Errors have been sought and corrected,but may not always be located. Such creation errors do not reflect on the standard of care.   Signed: Lorella Nimrod, MD [facility] 12/04/2022

## 2022-12-04 NOTE — Progress Notes (Signed)
  Transition of Care [facility]) Screening Note   Patient Details  Name: Andrew Castillo Date of Birth: 02/24/1996   Transition of Care [facility]) CM/SW Contact:    Dessa Phi, RN Phone Number: 12/04/2022, 11:14 AM    Transition of Care Department [facility]) has reviewed patient and no TOC needs have been identified at this time. We will continue to monitor patient advancement through interdisciplinary progression rounds. If new patient transition needs arise, please place a TOC consult.

## 2022-12-04 NOTE — Plan of Care (Signed)
  Problem: Education: Goal: Knowledge of General Education information will improve Description: Including pain rating scale, medication(s)/side effects and non-pharmacologic comfort measures 12/04/2022 1624 by Bertrum Sol, RN Outcome: Adequate for Discharge 12/04/2022 1009 by Bertrum Sol, RN Outcome: Progressing

## 2022-12-04 NOTE — Progress Notes (Signed)
Patient discharged home, discharge instructions given and explained to patient, he verbalized understanding, patient denies any pain/distress. No wound or pressure injury noted Accompanied home by family.

## 2022-12-04 NOTE — Progress Notes (Signed)
Initial Nutrition Assessment  INTERVENTION:   -Ensure Plus High Protein po BID, each supplement provides 350 kcal and 20 grams of protein.   -Weight patient for admission  NUTRITION DIAGNOSIS:   Inadequate oral intake related to nausea, vomiting as evidenced by per patient/family report.  GOAL:   Patient will meet greater than or equal to 90% of their needs  MONITOR:   PO intake, Supplement acceptance, Labs, Weight trends, I & O's  REASON FOR ASSESSMENT:   Consult Assessment of nutrition requirement/status  ASSESSMENT:   27 y.o. male with medical history significant of cyclic voiming seizure like activity, GERD, anxiety.  Presented with 3 day hx of intractable nausea and vomiting.  Patient in room, states he had some hot chocolate, chocolate milk, chocolate pudding and 1/2 bowl of cream of chicken soup since diet was advanced to fulls. States he is starting to feel better. Will be NPO tomorrow for EGD.  Pt states he has fluctuating symptoms of N/V at home. Symptoms worsened on 3/15. Has not been able to eat much when feeling sick. On good days he eats with no issue, states he is a "foodie". Pt still eats even if he is vomiting. This has not deterred him from eating. Agreeable to trying Ensure today for additional protein.  Pt reports UBW is 210 lbs. This is recorded weight for admission. States his weight has fluctuated in the past and he has lost down to 185 lbs at times. Will get new weight to check for weight loss.   Medications: Carafate, Lactated ringers, Zofran  Labs reviewed: UDS+ cocaine, THC   NUTRITION - FOCUSED PHYSICAL EXAM:  No depletions noted.  Diet Order:   Diet Order             Diet NPO time specified  Diet effective 0500 tomorrow           Diet full liquid Room service appropriate? Yes; Fluid consistency: Thin  Diet effective now                   EDUCATION NEEDS:   No education needs have been identified at this time  Skin:  Skin  Assessment: Reviewed RN Assessment  Last BM:  3/17  Height:   Ht Readings from Last 1 Encounters:  12/03/22 6' (1.829 m)    Weight:   Wt Readings from Last 1 Encounters:  12/03/22 95.3 kg    BMI:  Body mass index is 28.48 kg/m.  Estimated Nutritional Needs:   Kcal:  2000-2200  Protein:  100-110g  Fluid:  2L/day  Clayton Bibles, MS, RD, LDN Inpatient Clinical Dietitian Contact information available via Amion

## 2022-12-04 NOTE — Hospital Course (Addendum)
Taken from H&P.  Andrew Castillo is a 27 y.o. male with medical history significant of cyclic voiming, seizure like activity, cannabinoid and cocaine use GERD, anxiety presented with nausea and vomiting for 3 days. Patient has seen GI and has gastritis and esophagitis. In ER he was also concern about seizure-like activity 4 days ago, might have a panic attack.  No postictal state.  He never lost any consciousness, no incontinence. Recently neurology has changed him to low was seen 10 mg twice daily but he has not filled it yet.  Denies any alcohol intake.  ED course.  Vital stable.  Labs with leukocytosis at 14.6, electrolytes, lactic acid , TSH normal.  UDS positive for cocaine and tetrahydrocannabinol, UA negative for UTI, procalcitonin negative. CT head without any acute abnormality. CT abdomen and pelvis with possible enterocolitis.  Patient was started on Cipro and Flagyl.  3/19; vitals and labs stable, improving leukocytosis.  GI pathogen panel pending. Patient with improved symptoms and wants to go home.  Gastroenterologist saw him and does not think that he has any infectious colitis, most likely secondary to cocaine use. And cyclical nausea and vomiting with cannabinoid use.  Patient was counseled again.  Initially they were recommending follow-up EGD for his prior esophagitis but patient would like to continue follow-up with his gastroenterologist as an outpatient and was requesting discharge.  Patient will continue with rest of his home medications and need to have a close follow-up with his providers.  He will need continuation of counseling against illicit drug use.

## 2022-12-04 NOTE — Consult Note (Addendum)
Consultation  Referring Provider: [facility]/ Doutova Primary Care Physician:  Girtha Rm, NP-C Primary Gastroenterologist:  Dr.Mikhael Hendriks  Reason for Consultation: Vomiting, abdominal pain  HPI: Andrew Castillo is a 27 y.o. male, known to Dr. Carlean Purl who was last seen in our office in November 2023.  He has a tentative diagnosis of cyclic vomiting syndrome, and cannabis hyperemesis is also a possibility.  Patient also has a seizure disorder. When he was seen in November, was scheduled for EGD which was done per Dr. Bryan Lemma on 08/02/2022 and did reveal grade C esophagitis and a 3 cm hiatal hernia as well as mild antral gastritis.  Biopsies showed reactive gastropathy, no H. pylori.  Plan was for twice daily PPI and Carafate 4 times daily with repeat EGD in 8 weeks to document healing. He has had prior gastric emptying scan which was normal. He has since had MRI of the head in February 2024 which was negative. He does have a neurologist and has been maintained on Aptiom.  Patient was seen in the emergency room on 11/29/2022 with complaint of seizure, was then seen on 316 with nausea vomiting and abdominal pain, was discharged home after treatment then came back to the emergency room yesterday with persistent symptoms.  He says that this episode started on Friday, 11/29/2021 and that he had been sick with intractable nausea and vomiting since.  He says he was vomiting about 10 times per day and unable to keep any of his medications down.  He says usually the nausea and vomiting starts first and then after he has vomited a few times he develops abdominal pain which is in the mid to low abdomen.  With some of these episodes he will have an episode of diarrhea but this is never been an ongoing issue.  His fiance who is in the room says that these episodes have been occurring very frequently over the past months and that he only goes for a few days in between these episodes without having symptoms. He  says he has continued to take Protonix twice daily and has been taking Carafate at least twice daily. Drug screen through the emergency room yesterday was positive for HCV and cocaine. On further questioning today he says he does continue to use marijuana on a daily basis and has been using cocaine occasionally but denies daily use.  He had also been drinking alcohol fairly heavily up until a week or so ago.  He says he intends to stop drinking and that he has never drank alcohol for a prolonged period of time on an every day basis but does sometimes have recreational binge drinking.  CT of the abdomen and pelvis done yesterday showed moderate stool in the colon, there is some areas of wall thickening diffusely along the decompressed colon as well as a few areas along the small bowel.  Please correlate for subtle enterocolitis versus secondary to level of bowel distention. CT of the head was negative. Labs Tate 1.2, prealbumin 28, creatinine 0.94 LFTs within normal limits WBC 11.1/hemoglobin 15.3/hematocrit 46. Stool for C. difficile and GI path panel have been ordered.  Patient says he had not had a bowel movement for about 3 days prior to admission and had a formed hard stool today. Patient says that he uses cannabis regularly because he feels like it helps with his anxiety.  He says he has had appointment scheduled with his PCP to discuss maintenance medication for his anxiety but has been in and  out of the hospital and has been unable to be seen.   Past Medical History:  Diagnosis Date   Childhood asthma    Environmental allergies    GERD (gastroesophageal reflux disease)    Headache    migraines   Seasonal allergic conjunctivitis    Seizures ([place])    caused by flexiril 01/2022    Past Surgical History:  Procedure Laterality Date   ESOPHAGOGASTRODUODENOSCOPY     Left Knee Menuiscus     "several surguries on same knee"   NM ESOPHAGEAL REFLUX     Right Shoulder Surgery      TONSILLECTOMY     adenoids   UPPER GI ENDOSCOPY      Prior to Admission medications   Medication Sig Start Date End Date Taking? Authorizing Provider  diazePAM, 15 MG Dose, (VALTOCO 15 MG DOSE) 2 x 7.5 MG/0.1ML LQPK Place 15 mg into the nose as needed (For seizure lasting more than 5 minutes or for seizure clusters). 11/20/22  Yes Alric Ran, MD  dicyclomine (BENTYL) 10 MG capsule Take 1 capsule (10 mg total) by mouth 4 (four) times daily -  before meals and at bedtime. Patient taking differently: Take 10 mg by mouth daily as needed for spasms. 04/05/22  Yes Willia Craze, NP  Eslicarbazepine Acetate (APTIOM) 600 MG TABS Take 1 tablet by mouth every morning.   Yes [provider]  fluticasone (FLONASE) 50 MCG/ACT nasal spray SPRAY 2 SPRAYS INTO EACH NOSTRIL EVERY DAY Patient taking differently: Place 2 sprays into both nostrils daily as needed for allergies. 08/22/22  Yes [name], [name], NP-C  haloperidol (HALDOL) 5 MG tablet Take 1 tablet (5 mg total) by mouth every 6 (six) hours as needed (nausea, vomiting). 10/31/22  Yes Gatha Mayer, MD  hydrOXYzine (ATARAX) 25 MG tablet Take 25 mg by mouth 3 (three) times daily as needed for nausea or vomiting.   Yes [provider]  levocetirizine (XYZAL) 5 MG tablet TAKE 1 TABLET BY MOUTH EVERY DAY IN THE EVENING Patient taking differently: Take 5 mg by mouth daily as needed for allergies. 08/22/22  Yes [name], [name], NP-C  naproxen (NAPROSYN) 500 MG tablet Take 500 mg by mouth 2 (two) times daily as needed for moderate pain. 11/23/22  Yes [provider]  ondansetron (ZOFRAN) 8 MG tablet Take 1 tablet (8 mg total) by mouth every 8 (eight) hours as needed for nausea or vomiting. 10/21/22  Yes Pattricia Boss, MD  pantoprazole (PROTONIX) 40 MG tablet Take 1 tablet (40 mg total) by mouth 2 (two) times daily before a meal. 08/02/22  Yes Cirigliano, Vito V, DO  prochlorperazine (COMPAZINE) 25 MG suppository Place 1 suppository  (25 mg total) rectally every 12 (twelve) hours as needed for nausea or vomiting. 10/21/22  Yes Pattricia Boss, MD  sucralfate (CARAFATE) 1 GM/10ML suspension Take 10 mLs (1 g total) by mouth 4 (four) times daily -  with meals and at bedtime. 09/13/22 01/11/23 Yes Gatha Mayer, MD  cloBAZam (ONFI) 10 MG tablet Take 1 tablet (10 mg total) by mouth 2 (two) times daily. 11/30/22 05/29/23  Alric Ran, MD  ondansetron (ZOFRAN-ODT) 8 MG disintegrating tablet Take 1 tablet (8 mg total) by mouth every 8 (eight) hours as needed for nausea or vomiting. Patient not taking: Reported on [id number]   Delora Fuel, MD  promethazine (PHENERGAN) 25 MG suppository Place 1 suppository (25 mg total) rectally every 6 (six) hours as needed for nausea or vomiting. Patient  not taking: Reported on 12/03/2022 08/02/22   Cirigliano, Dominic Pea, DO    Current Facility-Administered Medications  Medication Dose Route Frequency Provider Last Rate Last Admin   acetaminophen (TYLENOL) tablet 650 mg  650 mg Oral Q6H PRN Toy Baker, MD       Or   acetaminophen (TYLENOL) suppository 650 mg  650 mg Rectal Q6H PRN Doutova, Anastassia, MD       ciprofloxacin (CIPRO) IVPB 400 mg  400 mg Intravenous Q12H Doutova, Anastassia, MD 200 mL/hr at 12/04/22 0841 400 mg at 12/04/22 0841   cloBAZam (ONFI) tablet 10 mg  10 mg Oral BID Toy Baker, MD   10 mg at 12/04/22 0948   cloNIDine (CATAPRES) tablet 0.1 mg  0.1 mg Oral BID PRN Toy Baker, MD   0.1 mg at 12/03/22 2150   haloperidol (HALDOL) tablet 5 mg  5 mg Oral Q6H PRN Toy Baker, MD       HYDROcodone-acetaminophen (NORCO/VICODIN) 5-325 MG per tablet 1-2 tablet  1-2 tablet Oral Q4H PRN Toy Baker, MD       lactated ringers infusion   Intravenous Continuous Doutova, Anastassia, MD 125 mL/hr at 12/03/22 1917 New Bag at 12/03/22 1917   loratadine (CLARITIN) tablet 10 mg  10 mg Oral Daily PRN Toy Baker, MD       LORazepam (ATIVAN) injection  0.5 mg  0.5 mg Intravenous Q6H PRN Doutova, Anastassia, MD       metoCLOPramide (REGLAN) injection 5 mg  5 mg Intravenous Q6H PRN Doutova, Anastassia, MD       metroNIDAZOLE (FLAGYL) IVPB 500 mg  500 mg Intravenous Q12H Doutova, Anastassia, MD 100 mL/hr at 12/04/22 1050 500 mg at 12/04/22 1050   nicotine (NICODERM CQ - dosed in mg/24 hours) patch 21 mg  21 mg Transdermal Daily Doutova, Anastassia, MD   21 mg at 12/04/22 0948   ondansetron (ZOFRAN) tablet 4 mg  4 mg Oral Q6H PRN Toy Baker, MD       Or   ondansetron (ZOFRAN) injection 4 mg  4 mg Intravenous Q6H PRN Doutova, Anastassia, MD   4 mg at 12/04/22 0955   pantoprazole (PROTONIX) EC tablet 40 mg  40 mg Oral BID AC Doutova, Anastassia, MD   40 mg at 12/04/22 0840   sucralfate (CARAFATE) 1 GM/10ML suspension 1 g  1 g Oral TID WC & HS Doutova, Anastassia, MD   1 g at 12/04/22 0839    Allergies as of 12/03/2022 - Review Complete 12/03/2022  Allergen Reaction Noted   Penicillins Hives 06/24/2019   Flexeril [cyclobenzaprine]  04/05/2022    Family History  Problem Relation Age of Onset   Fibromyalgia Mother    Thyroid disease Mother        Living   Lupus Mother    Anxiety disorder Mother    Clotting disorder Mother    Crohn's disease Mother    Hypertension Father    Hyperlipidemia Father        Living   Liver disease Father    Anxiety disorder Sister    ADD / ADHD Brother    Seizures Brother    Anxiety disorder Maternal Aunt    Depression Maternal Aunt    Lupus Maternal Aunt    Diabetes Paternal Aunt    Lupus Paternal Aunt    Fibromyalgia Paternal Aunt    Drug abuse Paternal Uncle    Diabetes Maternal Grandmother    Hypertension Maternal Grandmother    Liver disease Maternal Grandmother  Bone cancer Paternal Grandmother    Heart disease Paternal Grandfather    Diabetes Paternal Grandfather    Migraines Other        Various   Colon cancer Neg Hx    Esophageal cancer Neg Hx    Rectal cancer Neg Hx     Stomach cancer Neg Hx     Social History   Socioeconomic History   Marital status: Single    Spouse name: Not on file   Number of children: 1   Years of education: 12   Highest education level: Not on file  Occupational History   Occupation: [employer] features editor  Tobacco Use   Smoking status: Every Day    Packs/day: 0.50    Years: 2.00    Additional pack years: 0.00    Total pack years: 1.00    Types: Cigarettes   Smokeless tobacco: Never   Tobacco comments:    1 pack every 2-3 days  Vaping Use   Vaping Use: Never used  Substance and Sexual Activity   Alcohol use: Not Currently   Drug use: Yes    Types: Marijuana    Comment: Daily- smoked at 5 am 08/02/22   Sexual activity: Yes  Other Topics Concern   Not on file  Social History Narrative   Single-engaged, 1 child lives with fianc   Marijuana user Cigarette smoker   no current alcohol no other drugs   He is an Therapist, art   Social Determinants of Health   Financial Resource Strain: Not on file  Food Insecurity: No Food Insecurity (12/03/2022)   Hunger Vital Sign    Worried About Running Out of Food in the Last Year: Never true    Ran Out of Food in the Last Year: Never true  Transportation Needs: No Transportation Needs (12/03/2022)   PRAPARE - Hydrologist (Medical): No    Lack of Transportation (Non-Medical): No  Physical Activity: Not on file  Stress: Not on file  Social Connections: Not on file  Intimate Partner Violence: Not At Risk (12/03/2022)   Humiliation, Afraid, Rape, and Kick questionnaire    Fear of Current or Ex-Partner: No    Emotionally Abused: No    Physically Abused: No    Sexually Abused: No    Review of Systems: Gen: Denies any fever, chills, sweats, anorexia, fatigue, weakness, malaise, weight loss, Pertinent positive and negative review of systems were noted in the above HPI section.  All other review of systems was otherwise negative.    Physical Exam: Vital signs in last 24 hours: Temp:  [98.3 F (36.8 C)-99.6 F (37.6 C)] 98.9 F (37.2 C) (03/19 0600) Pulse Rate:  [71-97] 78 (03/19 0906) Resp:  [16-20] 18 (03/19 0906) BP: (119-178)/(70-123) 149/86 (03/19 0906) SpO2:  [97 %-100 %] 100 % (03/19 0906) Weight:  [95.3 kg] 95.3 kg (03/18 1500) Last BM Date : 12/02/22 General:   Alert,  Well-developed, well-nourished, young  male pleasant and cooperative in NAD, fianc at bedside Head:  Normocephalic and atraumatic. Eyes:  Sclera clear, no icterus.   Conjunctiva pink. Ears:  Normal auditory acuity. Nose:  No deformity, discharge,  or lesions. Mouth:  No deformity or lesions.   Neck:  Supple; no masses or thyromegaly. Lungs:  Clear throughout to auscultation.   No wheezes, crackles, or rhonchi.  Heart:  Regular rate and rhythm; no murmurs, clicks, rubs,  or gallops. Abdomen:  Soft, nondistended, there is some very  mild tenderness in the low mid abdomen no guarding or rebound BS active,nonpalp mass or hsm.   Rectal: Not done Msk:  Symmetrical without gross deformities. . Pulses:  Normal pulses noted. Extremities:  Without clubbing or edema. Neurologic:  Alert and  oriented x4;  grossly normal neurologically. Skin:  Intact without significant lesions or rashes.. Psych:  Alert and cooperative. Normal mood and affect.  Intake/Output from previous day: 03/18 0701 - 03/19 0700 In: 2075.2 [I.V.:776.1; IV Piggyback:1299.1] Out: 602 [Urine:600; Emesis/NG output:1; Stool:1] Intake/Output this shift: No intake/output data recorded.  Lab Results: Recent Labs    12/03/22 1556 12/04/22 0533  WBC 14.6* 11.1*  HGB 16.1 15.3  HCT 48.5 46.0  PLT 272 236   BMET Recent Labs    12/03/22 1556 12/04/22 0533  NA 138 137  K 3.8 3.5  CL 103 103  CO2 26 25  GLUCOSE 103* 94  BUN 13 9  CREATININE 1.03 0.94  CALCIUM 8.7* 8.2*   LFT Recent Labs    12/04/22 0533  PROT 6.3*  ALBUMIN 3.9  AST 16  ALT 17  ALKPHOS 47   BILITOT 1.0     IMPRESSION:  #7 27 year old male admitted yesterday with intractable nausea vomiting and abdominal pain times days. Has history of recurrent episodes of nausea vomiting and abdominal pain dating back over the past year or so.  Increased frequency of episodes over the past few months occurring at least a couple of times per week. 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  Contrasted CT yesterday showed some nonspecific mild wall thickening of areas of the colon and small bowel unclear whether this is inflammatory versus secondary to lack of bowel distention. Patient did not had a bowel movement for 3 days prior to admission had constipated stool today, unlikely this is an infectious gastroenteritis. 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  We discussed symptoms of cannabis hyperemesis syndrome, and absolute need for him to stop using cannabis if he wants to get better and not have to have repeated ER visits/admissions and imaging.  He thinks that he can stop using cannabis. Also discussed cocaine use and serious adverse health effects.  He is tolerating clear liquids, will advance to full liquids Await stool studies though suspect these will be negative Check fecal calprotectin Continue antiemetics IV and twice daily PPI and Carafate for now Will schedule for EGD with Dr. Carlean Purl tomorrow afternoon to re  evaluate for healing of grade C esophagitis. I do not think he  needs colonoscopy at this point but will discuss  Patient plans to follow-up with his PCP after discharge, I think it will be very much to his benefit if he can have his chronic anxiety managed.  We could consider starting a neuromodulator for cyclic vomiting.    Amy Esterwood PA-C 12/04/2022, 11:39 AM     [facility] Attending   I have taken an interval history, reviewed the chart and examined the patient. I agree with the Advanced Practitioner's note, impression and recommendations w/ following additions.  In  the past he had claimed to be off marijuana but still having nausea and vomiting but cocaine use and ? Of veracity make hyperemesis cannabis syndrome plausible still.  He tells me he is ready to get clean and sober - he needs to do this and get anxiety tx He has PCP eval Friday.  He sees me 4/2 as part of planned f/u.  OK for dc - he is tolerating diet. I bet Ct findings are artifactual. I would not treat w/ abx  Gatha Mayer, MD, [facility] See Shea Evans on call - gastroenterology for best contact person 12/04/2022 3:33 PM

## 2022-12-04 NOTE — Procedures (Signed)
Patient Name: Andrew Castillo  MRN: [id number]  Epilepsy Attending: Lora Havens  Referring Physician/Provider: Toy Baker, MD  Date: 12/04/2022 Duration: 21.17 mins  Patient history: 27yo M with seizure in setting of cocaine use. EEG to evaluate for seizure  Level of alertness: Awake  AEDs during EEG study: Onfi  Technical aspects: This EEG study was done with scalp electrodes positioned according to the 10-20 International system of electrode placement. Electrical activity was reviewed with band pass filter of 1-70Hz , sensitivity of 7 uV/mm, display speed of 64mm/sec with a 60Hz  notched filter applied as appropriate. EEG data were recorded continuously and digitally stored.  Video monitoring was available and reviewed as appropriate.  Description: The posterior dominant rhythm consists of 10 Hz activity of moderate voltage (25-35 uV) seen predominantly in posterior head regions, symmetric and reactive to eye opening and eye closing. Physiologic photic driving was seen during photic stimulation.  Hyperventilation was not performed.     IMPRESSION: This study is within normal limits. No seizures or epileptiform discharges were seen throughout the recording.  A normal interictal EEG does not exclude the diagnosis of epilepsy.  Andrew Castillo

## 2022-12-04 NOTE — Progress Notes (Signed)
EEG complete - results pending 

## 2022-12-05 ENCOUNTER — Encounter: Payer: Self-pay | Admitting: Family Medicine

## 2022-12-05 ENCOUNTER — Telehealth: Payer: Self-pay | Admitting: Family Medicine

## 2022-12-05 ENCOUNTER — Telehealth: Payer: Self-pay

## 2022-12-05 LAB — GASTROINTESTINAL PANEL BY PCR, STOOL (REPLACES STOOL CULTURE)

## 2022-12-05 SURGERY — ESOPHAGOGASTRODUODENOSCOPY (EGD) WITH PROPOFOL
Anesthesia: Monitor Anesthesia Care

## 2022-12-05 NOTE — Telephone Encounter (Signed)
See previous msg.. pt need appt for refill.Marland KitchenJohny Chess

## 2022-12-05 NOTE — Transitions of Care (Post Inpatient/ED Visit) (Signed)
   12/05/2022  Name: Andrew Castillo MRN: [id number] DOB: 1996-05-21  Today's TOC FU Call Status: Today's TOC FU Call Status:: Successful TOC FU Call Competed TOC FU Call Complete Date: 12/05/22  Transition Care Management Follow-up Telephone Call Date of Discharge: 12/04/22 Discharge Facility: [facility]) Type of Discharge: Inpatient Admission Primary Inpatient Discharge Diagnosis:: Enterocolitis How have you been since you were released from the hospital?: Better Any questions or concerns?: No  Items Reviewed: Did you receive and understand the discharge instructions provided?: Yes Medications obtained and verified?: Yes (Medications Reviewed) Any new allergies since your discharge?: No Dietary orders reviewed?: Yes Do you have support at home?: Yes  Home Care and Equipment/Supplies: [place] Ordered?: No Any new equipment or medical supplies ordered?: No  Functional Questionnaire: Do you need assistance with bathing/showering or dressing?: No Do you need assistance with meal preparation?: No Do you need assistance with eating?: No Do you have difficulty maintaining continence: No Do you need assistance with getting out of bed/getting out of a chair/moving?: No Do you have difficulty managing or taking your medications?: No  Follow up appointments reviewed: PCP Follow-up appointment confirmed?: Yes Date of PCP follow-up appointment?: 12/07/22 Follow-up Provider: Harland Dingwall NP-C [facility] Follow-up appointment confirmed?: Yes Date of Specialist follow-up appointment?: 12/18/22 Follow-Up Specialty Provider:: Dr Carlean Purl Do you need transportation to your follow-up appointment?: No Do you understand care options if your condition(s) worsen?: Yes-patient verbalized understanding    [place] Direct Dial [phone]

## 2022-12-05 NOTE — Telephone Encounter (Signed)
Prescription Request  12/05/2022  LOV: 05/24/2022  What is the name of the medication or equipment? hydrOXYzine (ATARAX) 25 MG tablet   Have you contacted your pharmacy to request a refill? Yes   Which pharmacy would you like this sent to?  [facility] on [address] in [place], [place]    Patient notified that their request is being sent to the clinical staff for review and that they should receive a response within 2 business days.   Please advise at Mobile [phone] (mobile)

## 2022-12-06 LAB — CALPROTECTIN, FECAL: Calprotectin, Fecal: 98 ug/g (ref 0–120)

## 2022-12-07 ENCOUNTER — Ambulatory Visit (INDEPENDENT_AMBULATORY_CARE_PROVIDER_SITE_OTHER): Payer: BC Managed Care – PPO | Admitting: Family Medicine

## 2022-12-07 ENCOUNTER — Encounter: Payer: Self-pay | Admitting: Family Medicine

## 2022-12-07 ENCOUNTER — Ambulatory Visit (HOSPITAL_COMMUNITY)
Admission: EM | Admit: 2022-12-07 | Discharge: 2022-12-07 | Disposition: A | Discharge status: Home / Self Care | Payer: BC Managed Care – PPO

## 2022-12-07 ENCOUNTER — Encounter: Payer: Self-pay | Admitting: Internal Medicine

## 2022-12-07 VITALS — BP 126/86 | HR 120 | Temp 97.8°F | Ht 72.0 in | Wt 192.0 lb

## 2022-12-07 DIAGNOSIS — R45851 Suicidal ideations: Secondary | ICD-10-CM | POA: Diagnosis not present

## 2022-12-07 DIAGNOSIS — Z09 Encounter for follow-up examination after completed treatment for conditions other than malignant neoplasm: Secondary | ICD-10-CM

## 2022-12-07 DIAGNOSIS — F1910 Other psychoactive substance abuse, uncomplicated: Secondary | ICD-10-CM

## 2022-12-07 DIAGNOSIS — K219 Gastro-esophageal reflux disease without esophagitis: Secondary | ICD-10-CM

## 2022-12-07 DIAGNOSIS — F419 Anxiety disorder, unspecified: Secondary | ICD-10-CM

## 2022-12-07 DIAGNOSIS — F410 Panic disorder [episodic paroxysmal anxiety] without agoraphobia: Secondary | ICD-10-CM

## 2022-12-07 DIAGNOSIS — F32A Anxiety disorder, unspecified: Secondary | ICD-10-CM

## 2022-12-07 DIAGNOSIS — G40909 Epilepsy, unspecified, not intractable, without status epilepticus: Secondary | ICD-10-CM

## 2022-12-07 DIAGNOSIS — F515 Nightmare disorder: Secondary | ICD-10-CM

## 2022-12-07 DIAGNOSIS — F1410 Cocaine abuse, uncomplicated: Secondary | ICD-10-CM

## 2022-12-07 DIAGNOSIS — R112 Nausea with vomiting, unspecified: Secondary | ICD-10-CM | POA: Diagnosis not present

## 2022-12-07 LAB — CBC WITH DIFFERENTIAL/PLATELET
Basophils Absolute: 0 10*3/uL (ref 0.0–0.1)
Basophils Relative: 0.2 % (ref 0.0–3.0)
Eosinophils Absolute: 0 10*3/uL (ref 0.0–0.7)
Eosinophils Relative: 0.3 % (ref 0.0–5.0)
HCT: 52.7 % — ABNORMAL HIGH (ref 39.0–52.0)
Hemoglobin: 17.5 g/dL — ABNORMAL HIGH (ref 13.0–17.0)
Lymphocytes Relative: 19 % (ref 12.0–46.0)
Lymphs Abs: 2.8 10*3/uL (ref 0.7–4.0)
MCHC: 33.2 g/dL (ref 30.0–36.0)
MCV: 88.2 fl (ref 78.0–100.0)
Monocytes Absolute: 1.2 10*3/uL — ABNORMAL HIGH (ref 0.1–1.0)
Monocytes Relative: 7.9 % (ref 3.0–12.0)
Neutro Abs: 10.7 10*3/uL — ABNORMAL HIGH (ref 1.4–7.7)
Neutrophils Relative %: 72.6 % (ref 43.0–77.0)
Platelets: 306 10*3/uL (ref 150.0–400.0)
RBC: 5.98 Mil/uL — ABNORMAL HIGH (ref 4.22–5.81)
RDW: 13.4 % (ref 11.5–15.5)
WBC: 14.8 10*3/uL — ABNORMAL HIGH (ref 4.0–10.5)

## 2022-12-07 LAB — BASIC METABOLIC PANEL
BUN: 17 mg/dL (ref 6–23)
CO2: 28 mEq/L (ref 19–32)
Calcium: 10.1 mg/dL (ref 8.4–10.5)
Chloride: 100 mEq/L (ref 96–112)
Creatinine, Ser: 1.11 mg/dL (ref 0.40–1.50)
GFR: 91.88 mL/min (ref >60.00)
Glucose, Bld: 87 mg/dL (ref 70–99)
Potassium: 3.8 mEq/L (ref 3.5–5.1)
Sodium: 138 mEq/L (ref 135–145)

## 2022-12-07 NOTE — ED Provider Notes (Signed)
Behavioral Health Urgent Care Medical Screening Exam  Patient Name: Andrew Castillo MRN: [id number] Date of Evaluation: 12/07/22 Chief Complaint: "I've been addicted to cocaine for the past 2 years, along with marijuana, cigarettes, alcohol" Diagnosis:  Final diagnoses:  Polysubstance abuse ([place])    History of Present illness: Andrew Castillo is a 27 y.o. male. Pt presents voluntarily to [facility] behavioral health for walk-in assessment.  Pt is accompanied by his wife and child. Pt is assessed face-to-face by nurse practitioner.   [name], 27 y.o., male patient seen face to face by this provider; and chart reviewed on 12/07/22.  On evaluation, when asked reason for presenting today, Andrew Castillo reports "I've been addicted to cocaine for the past 2 years, along with marijuana, cigarettes, alcohol." He denies use of opioids, methamphetamines, other substances.   Pt reports use of marijuana for the past 5 years. He reports daily use, with last use last Thursday. Pt reports use of alcohol since the age of 27 years old. He reports daily use of 1 to 2 beers/day. He reports last use was on Wednesday, when he drank 6 12oz beers while hanging out with friends. Pt reports use of cigarettes. He reports daily use, last use was earlier today. Pt reports use of powder cocaine through intranasal use. He reports for the past 2 to 3 months he has been purchasing 1g every 2 to 3 weeks. Pt reports last use of powder cocaine was last Thursday. Pt denies any IVDU. Pt denies withdrawal symptoms. Pt denies history of delirium tremens or withdrawal seizures. He notes he does have seizure history, although this is not from withdrawal. He reports his last seizure was last Thursday.   Pt denies suicidal, homicidal, violent ideation. He denies auditory visual hallucinations or paranoia.   Pt denies history of non suicidal self injurious behavior, suicide attempt or inpatient psychiatric  hospitalization.  Pt denies he is connected with psychiatric medication management or counseling.  Pt reports he is living with his fiance and their 18 year old daughter.  Pt reports there is a firearm in the home. It is locked up and secured. Ammunition locked up separately. He states the plan is to move the firearm to his father's home.   Pt reports he is employed at [employer]. He states he is planning on taking FMLA.  Pt reports his highest level of education is some college.  Collateral w/ pt's fiance, who denies safety concerns with pt discharge today. She confirms there is a firearm in the home and plan is for her to remove it today.   Pt is interested in outpatient substance use treatment. Discussed referral to [facility]. Pt gave best contact information as [phone]. [email or web address]. Also discussed outpatient psychiatry and counseling options, provided several resources. Discussed calling 911/EMS, going to the nearest emergency room or crisis center for worsening condition. Pt agrees.  [facility] from 12/07/2022 in [facility] ED to Hosp-Admission (Discharged) from 12/03/2022 in [facility] ED from 11/30/2022 in [facility] Emergency Department at [place] No Risk No Risk No Risk       Psychiatric Specialty Exam  Presentation  General Appearance:Appropriate for Environment; Casual; Fairly Groomed  Eye Contact:Fair  Speech:Clear and Coherent; Normal Rate  Speech Volume:Normal  Handedness:Right   Mood and Affect  Mood:Euthymic  Affect:Appropriate; Full Range   Thought Process  Thought Processes:Coherent; Goal Directed; Linear  Descriptions of Associations:Intact  Orientation:Full (Time, Place and Person)  Thought Content:Logical    Hallucinations:None  Ideas of Reference:None  Suicidal Thoughts:No  Homicidal  Thoughts:No   Sensorium  Memory:Immediate Good  Judgment:Good  Insight:Good   Executive Functions  Concentration:Good  Attention Span:Good  [place]  Language:Good   Psychomotor Activity  Psychomotor Activity:Normal   Assets  Assets:Communication Skills; Desire for Improvement; Financial Resources/Insurance; Housing; Intimacy; Leisure Time; Physical Health; Resilience; Social Support; Transportation; Vocational/Educational   Sleep  Sleep:-- (reports varies)  Number of hours: No data recorded  Physical Exam: Physical Exam Constitutional:      General: He is not in acute distress.    Appearance: Normal appearance. He is not ill-appearing, toxic-appearing or diaphoretic.  Eyes:     General: No scleral icterus. Cardiovascular:     Rate and Rhythm: Normal rate.  Pulmonary:     Effort: Pulmonary effort is normal. No respiratory distress.  Skin:    General: Skin is warm and dry.  Neurological:     Mental Status: He is alert and oriented to person, place, and time.  Psychiatric:        Attention and Perception: Attention and perception normal.        Mood and Affect: Mood and affect normal.        Speech: Speech normal.        Behavior: Behavior normal. Behavior is cooperative.        Thought Content: Thought content normal.        Cognition and Memory: Cognition and memory normal.        Judgment: Judgment normal.    Review of Systems  Constitutional:  Negative for chills and fever.  Respiratory:  Negative for shortness of breath.   Cardiovascular:  Negative for chest pain and palpitations.  Gastrointestinal:  Negative for abdominal pain.  Neurological:  Negative for headaches.  Psychiatric/Behavioral:  Positive for substance abuse.    Blood pressure (!) 143/90, pulse 100, temperature 98.3 F (36.8 C), temperature source Oral, resp. rate 18, SpO2 99 %. There is no height or weight on file to calculate  BMI.  Musculoskeletal: Strength & Muscle Tone: within normal limits Gait & Station: normal Patient leans: N/A   [facility] Discharge Disposition for Follow up and Recommendations: Based on my evaluation the patient does not appear to have an emergency medical condition and can be discharged with resources and follow up care in outpatient services for Medication Management, Individual Therapy, and Chemical Dependency Intensive Outpatient Program   Tharon Aquas, NP 12/07/2022, 11:06 AM

## 2022-12-07 NOTE — ED Triage Notes (Signed)
Pt presents to [facility] voluntarily accompanied by his wife seeking substance use treatment. Pt was referred by his PCP at [facility] after his visit today. Pt reports cocaine and Marijuana use. Pt states his last use of cocaine was 1 week ago and prior to that he was using 1 gram bi-weekly. Pt states his last use of Marijuana was 3 days ago and prior to that he was using 1 gram daily. Pt reports hx of anxiety, depression and seizures. Pt states he is prescribed medication for his seizures which also helps with his anxiety. Pt states his last seizure was last Thursday 3/14. Pt denies withdrawal symptoms but states he still has urges to use.Pt denies SI/HI and AVH.

## 2022-12-07 NOTE — Discharge Instructions (Signed)
You have been referred to the chemical dependency intensive outpatient program. A staff member should be reaching out to you shortly to schedule an intake assessment.

## 2022-12-07 NOTE — Patient Instructions (Addendum)
Please go to the [facility] at [place] in [place] for rehab:   [facility] [address] [place]  [phone]  [facility]  [place]  [phone]

## 2022-12-07 NOTE — Progress Notes (Unsigned)
Subjective:     Patient ID: Andrew Castillo, male    DOB: [birth year], 27 y.o.   MRN: [id number]  Chief Complaint  Patient presents with   Hospitalization Follow-up   Anxiety    HPI Patient is in today for anxiety and has several ED visits for anxiety, seizure activity and recent enterocolitis.  He has tested positive for Cocaine and cannabinoid on multiple occasions including his most recent hospitalization from 12/03/2022-12/04/2022.   GI saw him during the admission and thought symptoms most likely due to cocaine use and N/V related to cannabinoid use.  States he has not used cocaine since being discharged.   States he has smoked marijuana since being discharged. He has not established with a psychiatrist.  It has been recommended for him to seek psychiatrist and counselor and to go to the [facility] outpatient urgent care on [address].  States he wants help to stop using drugs.   States he needs paperwork filled out so he does not lose his job but does not want employer to know about substance use.   Under the care of GI for nausea and vomiting.  Sees neurology for seizure-like activity.   Recent CT head negative.  CT abd/pelvis showed possible enterocolitis.   States he often has thoughts he would be better off dead. States he has a gun in his home. He can give it to his father to keep.  No plan to harm himself. States he has had dreams where he has killed himself and injured himself. States he dreamed he shot himself once.   Reports taking Haldol daily.   His fiance and child are with him today.        12/07/2022    9:07 AM 05/24/2022    9:44 AM 09/25/2018   11:00 AM 11/12/2017    1:04 PM  Depression screen PHQ 2/9  Decreased Interest 2 0 0 1  Down, Depressed, Hopeless 3 0 0 1  PHQ - 2 Score 5 0 0 2  Altered sleeping 3  0 0  Tired, decreased energy 3  0 1  Change in appetite 3  0 1  Feeling bad or failure about yourself  3  0 0  Trouble concentrating 2  0 1   Moving slowly or fidgety/restless 1  0 0  Suicidal thoughts 1  0 0  PHQ-9 Score 21  0 5  Difficult doing work/chores Extremely dIfficult  Not difficult at all        Health Maintenance Due  Topic Date Due   COVID-19 Vaccine (1) Never done   Hepatitis C Screening  Never done   DTaP/Tdap/Td (6 - Td or Tdap) 04/09/2018    Past Medical History:  Diagnosis Date   Anxiety and depression    Childhood asthma    Cocaine abuse ([place])    Environmental allergies    GERD (gastroesophageal reflux disease)    Headache    migraines   Panic attacks    Seasonal allergic conjunctivitis    Seizures ([place])    caused by flexiril 01/2022    Past Surgical History:  Procedure Laterality Date   ESOPHAGOGASTRODUODENOSCOPY     Left Knee Menuiscus     "several surguries on same knee"   NM ESOPHAGEAL REFLUX     Right Shoulder Surgery     TONSILLECTOMY     adenoids   UPPER GI ENDOSCOPY      Family History  Problem Relation Age of Onset   Fibromyalgia  Mother    Thyroid disease Mother        Living   Lupus Mother    Anxiety disorder Mother    Clotting disorder Mother    Crohn's disease Mother    Hypertension Father    Hyperlipidemia Father        Living   Liver disease Father    Anxiety disorder Sister    ADD / ADHD Brother    Seizures Brother    Anxiety disorder Maternal Aunt    Depression Maternal Aunt    Lupus Maternal Aunt    Diabetes Paternal Aunt    Lupus Paternal Aunt    Fibromyalgia Paternal Aunt    Drug abuse Paternal Uncle    Diabetes Maternal Grandmother    Hypertension Maternal Grandmother    Liver disease Maternal Grandmother    Bone cancer Paternal Grandmother    Heart disease Paternal Grandfather    Diabetes Paternal Grandfather    Migraines Other        Various   Colon cancer Neg Hx    Esophageal cancer Neg Hx    Rectal cancer Neg Hx    Stomach cancer Neg Hx     Social History   Socioeconomic History   Marital status: Single    Spouse name: Not on  file   Number of children: 1   Years of education: 12   Highest education level: Not on file  Occupational History   Occupation: [employer] features editor  Tobacco Use   Smoking status: Every Day    Packs/day: 0.50    Years: 2.00    Additional pack years: 0.00    Total pack years: 1.00    Types: Cigarettes   Smokeless tobacco: Never   Tobacco comments:    1 pack every 2-3 days  Vaping Use   Vaping Use: Never used  Substance and Sexual Activity   Alcohol use: Not Currently   Drug use: Yes    Types: Marijuana    Comment: Daily- smoked at 5 am 08/02/22   Sexual activity: Yes  Other Topics Concern   Not on file  Social History Narrative   Single-engaged, 1 child lives with fianc   Marijuana user Cigarette smoker   no current alcohol no other drugs   He is an Therapist, art   Social Determinants of Health   Financial Resource Strain: Not on file  Food Insecurity: No Food Insecurity (12/03/2022)   Hunger Vital Sign    Worried About Running Out of Food in the Last Year: Never true    Ran Out of Food in the Last Year: Never true  Transportation Needs: No Transportation Needs (12/03/2022)   PRAPARE - Hydrologist (Medical): No    Lack of Transportation (Non-Medical): No  Physical Activity: Not on file  Stress: Not on file  Social Connections: Not on file  Intimate Partner Violence: Not At Risk (12/03/2022)   Humiliation, Afraid, Rape, and Kick questionnaire    Fear of Current or Ex-Partner: No    Emotionally Abused: No    Physically Abused: No    Sexually Abused: No    Outpatient Medications Prior to Visit  Medication Sig Dispense Refill   cloBAZam (ONFI) 10 MG tablet Take 1 tablet (10 mg total) by mouth 2 (two) times daily. 60 tablet 5   dicyclomine (BENTYL) 10 MG capsule Take 1 capsule (10 mg total) by mouth 4 (four) times daily -  before meals and  at bedtime. (Patient taking differently: Take 10 mg by mouth daily as needed for  spasms.) 40 capsule 3   fluticasone (FLONASE) 50 MCG/ACT nasal spray SPRAY 2 SPRAYS INTO EACH NOSTRIL EVERY DAY (Patient taking differently: Place 2 sprays into both nostrils daily as needed for allergies.) 48 mL 1   haloperidol (HALDOL) 5 MG tablet Take 1 tablet (5 mg total) by mouth every 6 (six) hours as needed (nausea, vomiting). 30 tablet 1   hydrOXYzine (ATARAX) 25 MG tablet Take 25 mg by mouth 3 (three) times daily as needed for nausea or vomiting.     levocetirizine (XYZAL) 5 MG tablet TAKE 1 TABLET BY MOUTH EVERY DAY IN THE EVENING (Patient taking differently: Take 5 mg by mouth daily as needed for allergies.) 90 tablet 0   naproxen (NAPROSYN) 500 MG tablet Take 500 mg by mouth 2 (two) times daily as needed for moderate pain.     ondansetron (ZOFRAN) 8 MG tablet Take 1 tablet (8 mg total) by mouth every 8 (eight) hours as needed for nausea or vomiting. 20 tablet 0   pantoprazole (PROTONIX) 40 MG tablet Take 1 tablet (40 mg total) by mouth 2 (two) times daily before a meal. 90 tablet 3   prochlorperazine (COMPAZINE) 25 MG suppository Place 1 suppository (25 mg total) rectally every 12 (twelve) hours as needed for nausea or vomiting. 12 suppository 0   sucralfate (CARAFATE) 1 GM/10ML suspension Take 10 mLs (1 g total) by mouth 4 (four) times daily -  with meals and at bedtime. 0000000 mL 3   Eslicarbazepine Acetate (APTIOM) 600 MG TABS Take 1 tablet by mouth every morning. (Patient not taking: Reported on 12/07/2022)     No facility-administered medications prior to visit.    Allergies  Allergen Reactions   Penicillins Hives    Did it involve swelling of the face/tongue/throat, SOB, or low BP? Unknown Did it involve sudden or severe rash/hives, skin peeling, or any reaction on the inside of your mouth or nose? Unknown Did you need to seek medical attention at a hospital or doctor's office? Unknown When did it last happen?   Pt doesn't remember he said he was a child    If all above answers  are "NO", may proceed with cephalosporin use.    Flexeril [Cyclobenzaprine]     seizures    Review of Systems  Constitutional:  Negative for chills and fever.  Respiratory:  Negative for shortness of breath.   Cardiovascular:  Negative for chest pain, palpitations and leg swelling.  Gastrointestinal:  Negative for abdominal pain, constipation, diarrhea, nausea and vomiting.  Genitourinary:  Negative for dysuria, frequency and urgency.  Neurological:  Negative for dizziness, focal weakness and headaches.  Psychiatric/Behavioral:  Positive for depression, substance abuse and suicidal ideas. Negative for hallucinations. The patient is nervous/anxious.        Objective:    Physical Exam Constitutional:      General: He is not in acute distress.    Appearance: He is not ill-appearing.  HENT:     Mouth/Throat:     Mouth: Mucous membranes are moist.  Eyes:     Extraocular Movements: Extraocular movements intact.     Conjunctiva/sclera: Conjunctivae normal.  Cardiovascular:     Rate and Rhythm: Tachycardia present.  Pulmonary:     Effort: Pulmonary effort is normal.  Musculoskeletal:     Cervical back: Normal range of motion and neck supple.  Skin:    General: Skin is warm and dry.  Neurological:     General: No focal deficit present.     Mental Status: He is alert and oriented to person, place, and time.     Cranial Nerves: No cranial nerve deficit or facial asymmetry.     Motor: No weakness.     Gait: Gait normal.  Psychiatric:        Attention and Perception: Attention normal.        Mood and Affect: Mood normal.        Speech: Speech normal.        Behavior: Behavior normal.        Thought Content: Thought content is not paranoid. Thought content does not include homicidal or suicidal ideation. Thought content does not include homicidal or suicidal plan.     BP 126/86 (BP Location: Left Arm, Patient Position: Sitting, Cuff Size: Large)   Pulse (!) 120   Temp 97.8 F  (36.6 C) (Temporal)   Ht 6' (1.829 m)   Wt 192 lb (87.1 kg)   SpO2 97%   BMI 26.04 kg/m  Wt Readings from Last 3 Encounters:  12/07/22 192 lb (87.1 kg)  12/03/22 210 lb (95.3 kg)  11/30/22 210 lb (95.3 kg)       Assessment & Plan:   Problem List Items Addressed This Visit       Digestive   Esophageal reflux     Nervous and Auditory   Seizure disorder [facility])   Relevant Orders   Ambulatory referral to Psychiatry   Basic metabolic panel (Completed)   CBC with Differential/Platelet (Completed)     Other   Anxiety and depression   Relevant Orders   Ambulatory referral to Psychiatry   Basic metabolic panel (Completed)   CBC with Differential/Platelet (Completed)   Cocaine abuse ([place])   Panic attacks   Relevant Orders   Ambulatory referral to Psychiatry   Suicidal thoughts - Primary   Relevant Orders   Ambulatory referral to Psychiatry   Other Visit Diagnoses     Nightmares       Relevant Orders   Ambulatory referral to Psychiatry   Hospital discharge follow-up       Relevant Orders   Basic metabolic panel (Completed)   CBC with Differential/Platelet (Completed)   Nausea and vomiting, unspecified vomiting type       Relevant Orders   Basic metabolic panel (Completed)   CBC with Differential/Platelet (Completed)      Patient's fiance and child are with him during the visit.  Reviewed ED and hospitalist notes, summary, lab and imaging results.  He has suicidal ideas but no intention of self harm or plan. He will ask his father to keep his gun and avoid having this in his home.  Encouraged him to get immediate help with inpatient or outpatient substance abuse and underlying anxiety. He agrees to go to the Outpatient behavioral health urgent care [address] immediately following this visit.  Explained that I cannot provide FMLA or short term disability paperwork. Strongly encouraged that he become involved in a treatment program for substance abuse and to seek  a mental health specialist.  Provided him a list of possible treatment locations.  Check BMP and CBC per hospital discharge summary.    I have discontinued [facility] Aptiom. I am also having him maintain his dicyclomine, pantoprazole, fluticasone, levocetirizine, sucralfate, ondansetron, prochlorperazine, haloperidol, cloBAZam, naproxen, and hydrOXYzine.  No orders of the defined types were placed in this encounter.

## 2022-12-10 ENCOUNTER — Telehealth (HOSPITAL_COMMUNITY): Payer: Self-pay | Admitting: Licensed Clinical Social Worker

## 2022-12-10 NOTE — Telephone Encounter (Signed)
FYI pt inquiring about short term disability. Advised you are out of office this week.

## 2022-12-10 NOTE — Telephone Encounter (Signed)
The therapist calls Andrew Castillo verifying his identity via two identifiers. [name] initially says that now is a good time to talk; however, it becomes apparent that he sounds distracted. He apologizes saying that he is cooking his daughter's lunch. Thus the therapist informs him that he can call back at a different time if this time is not good with [name] asking that the therapist call back in an "hour or two."  Adam Phenix, [place], [place], [facility], [place] 12/10/2022

## 2022-12-10 NOTE — Telephone Encounter (Signed)
The therapist calls Andrew Castillo confirming his identity via two identifiers. The therapist explains the hours and other details about the [facility] IOP. He says that he does not really believe that he needs the IOP as he is going on 2.5 weeks without using anything and is doing well except for some cravings to use marijuana saying that he has been smoking it since the 9th grade.   He does say that "depression and anxiety" are "hitting" him "hard." He is presently out of work "due to health issues" and cannot return until cleared by his psychiatrist noting that he has an appointment at [facility] on April 12th.   He says that the wanted to quit as he was having "vomitting spells" and panic attacks from the cocaine. The therapist makes him aware of Cannabinoid hyperemesis syndrome which he says was mentioned previously.   Andrew Castillo says that prior to now that he had no sobriety since age 25; however, he likely has no sobriety going back further given that he has been smoking pot since the 9th grade. He says that he would smoke about a gram of pot per day.   The therapist educates him about PAWS and how to access NA and AA meetings encouraging him to work on attending to obtain sober supports.   He requests to see this therapist for individual therapy and schedules to see him on 12/13/22 at 9 a.m.  Adam Phenix, [place], LCSW, [facility], [place] 12/10/2022

## 2022-12-11 ENCOUNTER — Other Ambulatory Visit: Payer: Self-pay

## 2022-12-11 DIAGNOSIS — R109 Unspecified abdominal pain: Secondary | ICD-10-CM

## 2022-12-11 DIAGNOSIS — R1084 Generalized abdominal pain: Secondary | ICD-10-CM

## 2022-12-11 MED ORDER — DICYCLOMINE HCL 10 MG PO CAPS: 10.0000 mg | ORAL_CAPSULE | Freq: Three times a day (TID) | ORAL | 0 refills | Status: DC

## 2022-12-13 ENCOUNTER — Encounter (HOSPITAL_COMMUNITY): Payer: Self-pay

## 2022-12-13 ENCOUNTER — Ambulatory Visit (INDEPENDENT_AMBULATORY_CARE_PROVIDER_SITE_OTHER): Payer: BC Managed Care – PPO | Admitting: Licensed Clinical Social Worker

## 2022-12-13 DIAGNOSIS — F39 Unspecified mood [affective] disorder: Secondary | ICD-10-CM

## 2022-12-13 DIAGNOSIS — F1920 Other psychoactive substance dependence, uncomplicated: Secondary | ICD-10-CM
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
## 2022-12-18 ENCOUNTER — Telehealth (HOSPITAL_COMMUNITY): Payer: Self-pay | Admitting: Licensed Clinical Social Worker

## 2022-12-18 ENCOUNTER — Encounter: Payer: Self-pay | Admitting: Internal Medicine

## 2022-12-18 ENCOUNTER — Ambulatory Visit (INDEPENDENT_AMBULATORY_CARE_PROVIDER_SITE_OTHER): Payer: BC Managed Care – PPO | Admitting: Internal Medicine

## 2022-12-18 VITALS — BP 126/86 | HR 91 | Ht 72.0 in | Wt 204.6 lb

## 2022-12-18 DIAGNOSIS — F1910 Other psychoactive substance abuse, uncomplicated: Secondary | ICD-10-CM | POA: Diagnosis not present

## 2022-12-18 DIAGNOSIS — F1210 Cannabis abuse, uncomplicated: Secondary | ICD-10-CM | POA: Insufficient documentation

## 2022-12-18 DIAGNOSIS — F1410 Cocaine abuse, uncomplicated: Secondary | ICD-10-CM

## 2022-12-18 DIAGNOSIS — K2100 Gastro-esophageal reflux disease with esophagitis, without bleeding: Secondary | ICD-10-CM

## 2022-12-18 DIAGNOSIS — R1115 Cyclical vomiting syndrome unrelated to migraine: Secondary | ICD-10-CM

## 2022-12-18 NOTE — Progress Notes (Signed)
MERCY PANGAN 27 y.o. 1995-12-09 [id number]  Assessment & Plan:   Encounter Diagnoses  Name Primary?   Gastroesophageal reflux disease with esophagitis without hemorrhage Yes   Persistent vomiting    Polysubstance abuse    Marijuana abuse    Cocaine abuse    He is much better now that he has stopped cocaine and marijuana.  He is encouraged to continue with his therapy and rehabilitation.  Seeing a psychiatrist with plan will help as well as I believe there are mental health issues underlying this.  I have offered to fill out his FMLA so he can get back to work  Regarding the morning heartburn I think we should observe this she could add nighttime Pepcid but with time this should get better.  If it does not we may make changes.  Office follow-up in about 2 months.  At some point determine timing of a repeat endoscopy to document healing of esophagitis.  I want months of time without vomiting before we do that.  CC: [name], [name], NP-C     Subjective:  GI summary:   Persistent vomiting problems cause not clear, normal gastric emptying study 2023, GERD, cannabis stopped no effect, variable response to ondansetron, Reglan, promethazine/prochlorperazine suppositories.  Haldol has helped.  Marijuana plus or minus cocaine likely culprit based upon clinical course into 2024 see below     GERD with esophagitis and 3 cm hiatal hernia, grade B 2017, grade C November 2023 (after coffee-ground emesis)-reactive gastropathy on stomach biopsies, grade B September 13, 2022  Abdominal wall pain at times from vomiting  Polysubstance abuse: Chronic marijuana user intermittent cocaine user stopped March 2024 and has improved since then or any nausea vomiting  Chief Complaint: Follow-up of vomiting, heartburn, GERD  HPI Andrew Castillo is here for follow-up, I had actually seen him at the hospital on 12/04/2022 when he had a brief admission for persistent vomiting.  His talk screen was positive for  cannabis and cocaine.  We had a better discussion about his situation and he made a commitment to stop using both and has done so since.  He is seeing a therapist and has plans to see a psychiatrist.  The day he was sent to the ER he was complaining of some suicidal thoughts with primary care provider as well.  He says he feels like a "new man".  No more vomiting.  In the morning he has a little bit of heartburn which goes away after taking his pantoprazole and eating but otherwise doing well on his twice daily pantoprazole.  Not yet back to work needs FMLA forms filled out.  He wants to go back to work.  He is eating his last meal around 6 or 7 and going to bed at 10 or 11.  He has insomnia issues but heartburn is not waking him up at night.  Abdominal pain is better as well. Wt Readings from Last 3 Encounters:  12/18/22 204 lb 9.6 oz (92.8 kg)  12/07/22 192 lb (87.1 kg)  12/03/22 210 lb (95.3 kg)   CT scanning on 12/03/2022 demonstrated some diffuse wall thickening of the colon and a small hiatal hernia.  Decreased size and small mesenteric nodes.  He improved rapidly, I did not think he had an infection and thought this was CT "overcall".  Probably under distended bowel.  Head CT was negative. Allergies  Allergen Reactions   Penicillins Hives    Did it involve swelling of the face/tongue/throat, SOB, or low  BP? Unknown Did it involve sudden or severe rash/hives, skin peeling, or any reaction on the inside of your mouth or nose? Unknown Did you need to seek medical attention at a hospital or doctor's office? Unknown When did it last happen?   Pt doesn't remember he said he was a child    If all above answers are "NO", may proceed with cephalosporin use.    Flexeril [Cyclobenzaprine]     seizures   Current Meds  Medication Sig   cloBAZam (ONFI) 10 MG tablet Take 1 tablet (10 mg total) by mouth 2 (two) times daily.   dicyclomine (BENTYL) 10 MG capsule Take 1 capsule (10 mg total) by mouth 4  (four) times daily -  before meals and at bedtime for 7 days.   fluticasone (FLONASE) 50 MCG/ACT nasal spray SPRAY 2 SPRAYS INTO EACH NOSTRIL EVERY DAY (Patient taking differently: Place 2 sprays into both nostrils daily as needed for allergies.)   haloperidol (HALDOL) 5 MG tablet Take 1 tablet (5 mg total) by mouth every 6 (six) hours as needed (nausea, vomiting).   levocetirizine (XYZAL) 5 MG tablet TAKE 1 TABLET BY MOUTH EVERY DAY IN THE EVENING (Patient taking differently: Take 5 mg by mouth daily as needed for allergies.)   naproxen (NAPROSYN) 500 MG tablet Take 500 mg by mouth 2 (two) times daily as needed for moderate pain.   ondansetron (ZOFRAN) 8 MG tablet Take 1 tablet (8 mg total) by mouth every 8 (eight) hours as needed for nausea or vomiting.   pantoprazole (PROTONIX) 40 MG tablet Take 1 tablet (40 mg total) by mouth 2 (two) times daily before a meal.   prochlorperazine (COMPAZINE) 25 MG suppository Place 1 suppository (25 mg total) rectally every 12 (twelve) hours as needed for nausea or vomiting.   sucralfate (CARAFATE) 1 GM/10ML suspension Take 10 mLs (1 g total) by mouth 4 (four) times daily -  with meals and at bedtime.   Past Medical History:  Diagnosis Date   Anxiety and depression    Childhood asthma    Cocaine abuse    Environmental allergies    GERD (gastroesophageal reflux disease)    Headache    migraines   Panic attacks    Seasonal allergic conjunctivitis    Seizures    caused by flexiril 01/2022   Past Surgical History:  Procedure Laterality Date   ESOPHAGOGASTRODUODENOSCOPY     Left Knee Menuiscus     "several surguries on same knee"   NM ESOPHAGEAL REFLUX     Right Shoulder Surgery     TONSILLECTOMY     adenoids   UPPER GI ENDOSCOPY     Social History   Social History Narrative   Single-engaged, 1 child lives with fianc   Marijuana user Cigarette smoker   no current alcohol no other drugs   He is an Therapist, art   family history includes  ADD / ADHD in his brother; Anxiety disorder in his maternal aunt, mother, and sister; Bone cancer in his paternal grandmother; Clotting disorder in his mother; Crohn's disease in his mother; Depression in his maternal aunt; Diabetes in his maternal grandmother, paternal aunt, and paternal grandfather; Drug abuse in his paternal uncle; Fibromyalgia in his mother and paternal aunt; Heart disease in his paternal grandfather; Hyperlipidemia in his father; Hypertension in his father and maternal grandmother; Liver disease in his father and maternal grandmother; Lupus in his maternal aunt, mother, and paternal aunt; Migraines in an other family member; Seizures  in his brother; Thyroid disease in his mother.   Review of Systems As above  Objective:   Physical Exam BP 126/86   Pulse 91   Ht 6' (1.829 m)   Wt 204 lb 9.6 oz (92.8 kg)   BMI 27.75 kg/m

## 2022-12-18 NOTE — Patient Instructions (Addendum)
We have you scheduled for a follow up on 02/21/2023 at  9:10am with Dr Carlean Purl  Please purchase the following medications over the counter and take as directed: Pepcid- as needed at bed time  Keep up the good work! _______________________________________________________  If your blood pressure at your visit was 140/90 or greater, please contact your primary care physician to follow up on this.  _______________________________________________________  If you are age 26 or older, your body mass index should be between 23-30. Your Body mass index is 27.75 kg/m. If this is out of the aforementioned range listed, please consider follow up with your Primary Care Provider.  If you are age 85 or younger, your body mass index should be between 19-25. Your Body mass index is 27.75 kg/m. If this is out of the aformentioned range listed, please consider follow up with your Primary Care Provider.   ________________________________________________________  The [facility] providers would like to encourage you to use [facility] to communicate with providers for non-urgent requests or questions.  Due to long hold times on the telephone, sending your provider a message by [facility] may be a faster and more efficient way to get a response.  Please allow 48 business hours for a response.  Please remember that this is for non-urgent requests.  _______________________________________________________ It was a pleasure to see you today!  Thank you for trusting me with your gastrointestinal care!

## 2022-12-18 NOTE — Telephone Encounter (Signed)
The therapist attempts to return Andrew Castillo's call leaving a HIPAA-compliant voicemail.  Adam Phenix, [place], LCSW, [facility], [place] 12/18/2022

## 2022-12-20 ENCOUNTER — Telehealth: Payer: Self-pay

## 2022-12-20 NOTE — Telephone Encounter (Signed)
FMLA paperwork completed and faxed to [phone] per patient request. Paperwork scanned into epic. Patient informed.

## 2022-12-26 DIAGNOSIS — Z0289 Encounter for other administrative examinations: Secondary | ICD-10-CM

## 2022-12-28 DIAGNOSIS — F331 Major depressive disorder, recurrent, moderate: Secondary | ICD-10-CM | POA: Diagnosis not present

## 2022-12-28 DIAGNOSIS — F411 Generalized anxiety disorder: Secondary | ICD-10-CM | POA: Diagnosis not present

## 2022-12-31 NOTE — Telephone Encounter (Signed)
  This was in the chart, please let me know if forms are still needed

## 2022-12-31 NOTE — Telephone Encounter (Signed)
Please write him a letter to return to work tomorrow but no driving.

## 2023-01-03 ENCOUNTER — Encounter (HOSPITAL_COMMUNITY): Payer: Self-pay | Admitting: Licensed Clinical Social Worker

## 2023-01-03 ENCOUNTER — Ambulatory Visit (HOSPITAL_COMMUNITY): Payer: BC Managed Care – PPO | Admitting: Licensed Clinical Social Worker

## 2023-01-04 DIAGNOSIS — M25562 Pain in left knee: Secondary | ICD-10-CM | POA: Diagnosis not present

## 2023-01-04 DIAGNOSIS — Z87898 Personal history of other specified conditions: Secondary | ICD-10-CM | POA: Diagnosis not present

## 2023-01-04 DIAGNOSIS — Y9364 Activity, baseball: Secondary | ICD-10-CM | POA: Diagnosis not present

## 2023-01-07 ENCOUNTER — Telehealth: Payer: Self-pay

## 2023-01-07 DIAGNOSIS — S83282A Other tear of lateral meniscus, current injury, left knee, initial encounter: Secondary | ICD-10-CM | POA: Diagnosis not present

## 2023-01-07 NOTE — Telephone Encounter (Signed)
TOC completed, patient has questions about Xyzal dose.  His allergies have been really bad and he would like to know if dose can be increased or a new medication prescribed?

## 2023-01-07 NOTE — Transitions of Care (Post Inpatient/ED Visit) (Signed)
   01/07/2023  Name: Andrew Castillo MRN: [id number] DOB: 1996-06-28  Today's TOC FU Call Status: Today's TOC FU Call Status:: Successful TOC FU Call Competed TOC FU Call Complete Date: 01/07/23  Transition Care Management Follow-up Telephone Call Date of Discharge: 01/04/23 Discharge Facility: Other ([facility]) Name of Other ([facility]) Discharge Facility: [facility] Type of Discharge: Emergency Department Primary Inpatient Discharge Diagnosis:: knee pain Reason for ED Visit: Other: How have you been since you were released from the hospital?: Better Any questions or concerns?: No  Items Reviewed: Did you receive and understand the discharge instructions provided?: Yes Medications obtained and verified?: Yes (Medications Reviewed) Any new allergies since your discharge?: No Dietary orders reviewed?: NA Do you have support at home?: Yes People in Home: significant other  Home Care and Equipment/Supplies: Were Home Health Services Ordered?: NA Any new equipment or medical supplies ordered?: NA  Functional Questionnaire: Do you need assistance with bathing/showering or dressing?: No Do you need assistance with meal preparation?: No Do you need assistance with eating?: No Do you have difficulty maintaining continence: No Do you need assistance with getting out of bed/getting out of a chair/moving?: No Do you have difficulty managing or taking your medications?: No  Follow up appointments reviewed: PCP Follow-up appointment confirmed?: NA (declined- will folllow up with specialist) Specialist Hospital Follow-up appointment confirmed?: Yes Date of Specialist follow-up appointment?: 01/07/23 Follow-Up Specialty Provider:: Dr. Tanja Port -Ortho Do you need transportation to your follow-up appointment?: No Do you understand care options if your condition(s) worsen?: Yes-patient verbalized understanding    SIGNATURE  Agnes Lawrence, CMA (AAMA)  [facility]  Program [phone]

## 2023-01-08 NOTE — Telephone Encounter (Signed)
LM for pt with PCP recommendations. Provided office number for any questions

## 2023-01-11 ENCOUNTER — Telehealth: Payer: Self-pay

## 2023-01-11 NOTE — Telephone Encounter (Signed)
FMLA paperwork completed by Dr Leone Payor and faxed to [facility] at # [phone]. Left [place] a voicemail that this has been done. It has been scanned into epic.

## 2023-01-15 DIAGNOSIS — S83282A Other tear of lateral meniscus, current injury, left knee, initial encounter: Secondary | ICD-10-CM | POA: Diagnosis not present

## 2023-01-16 ENCOUNTER — Institutional Professional Consult (permissible substitution): Payer: BC Managed Care – PPO | Admitting: Neurology

## 2023-01-16 ENCOUNTER — Encounter: Payer: Self-pay | Admitting: Neurology

## 2023-01-23 DIAGNOSIS — S83282D Other tear of lateral meniscus, current injury, left knee, subsequent encounter: Secondary | ICD-10-CM | POA: Diagnosis not present

## 2023-01-29 ENCOUNTER — Encounter: Payer: Self-pay | Admitting: Neurology

## 2023-01-29 NOTE — Telephone Encounter (Signed)
He was cleared to work as of 4/16. He can use that letter.

## 2023-02-21 ENCOUNTER — Ambulatory Visit: Payer: BC Managed Care – PPO | Admitting: Internal Medicine

## 2023-03-28 ENCOUNTER — Ambulatory Visit: Payer: Managed Care, Other (non HMO) | Admitting: Neurology

## 2023-04-24 DIAGNOSIS — M2392 Unspecified internal derangement of left knee: Secondary | ICD-10-CM | POA: Insufficient documentation

## 2023-05-16 ENCOUNTER — Emergency Department (HOSPITAL_COMMUNITY)
Admission: EM | Admit: 2023-05-16 | Discharge: 2023-05-16 | Disposition: A | Discharge status: Home / Self Care | Payer: Medicaid Other | Attending: Emergency Medicine | Admitting: Emergency Medicine

## 2023-05-16 ENCOUNTER — Telehealth: Payer: Self-pay | Admitting: Internal Medicine

## 2023-05-16 ENCOUNTER — Encounter (HOSPITAL_COMMUNITY): Payer: Self-pay

## 2023-05-16 DIAGNOSIS — R197 Diarrhea, unspecified: Secondary | ICD-10-CM | POA: Insufficient documentation

## 2023-05-16 DIAGNOSIS — H9202 Otalgia, left ear: Secondary | ICD-10-CM | POA: Insufficient documentation

## 2023-05-16 DIAGNOSIS — R1084 Generalized abdominal pain: Secondary | ICD-10-CM | POA: Diagnosis not present

## 2023-05-16 DIAGNOSIS — R000 Tachycardia, unspecified: Secondary | ICD-10-CM | POA: Diagnosis not present

## 2023-05-16 DIAGNOSIS — R1110 Vomiting, unspecified: Secondary | ICD-10-CM | POA: Diagnosis not present

## 2023-05-16 MED ORDER — OMEPRAZOLE 40 MG PO CPDR: 40.0000 mg | DELAYED_RELEASE_CAPSULE | Freq: Two times a day (BID) | ORAL | 3 refills | Status: DC

## 2023-05-16 NOTE — ED Triage Notes (Signed)
Pt presents with c/o left ear pain that has been present for approx one week. Pt reports he also began vomiting yesterday.

## 2023-05-16 NOTE — Telephone Encounter (Signed)
Spoke with patient & he feels that his reflux symptoms have worsened. He's currently taking pantoprazole 40 mg BID & pepcid BID. He also tried a Zegerid powder packet that his mom takes and said that it did help some. He mentioned he did have recent vomiting d/t ear infection, but that this has been progressively getting worse over time. He is scheduled to see Marchelle Folks in November, but would like to what he can do in the meantime. Last seen with Dr. Leone Payor 12/2022 for OV.

## 2023-05-16 NOTE — ED Notes (Signed)
AVS provided to and discussed with patient. Pt verbalizes understanding of discharge instructions and denies any questions or concerns at this time. Pt ambulated out of department independently with steady gait.  

## 2023-05-16 NOTE — Telephone Encounter (Signed)
Spoke with patient regarding MD recommendations. Rescheduled OV for 06/26/23 at 8:50 am with Dr. Leone Payor. Advised patient to call back prior to office visit if symptoms worsen. Pt verbalized all understanding.

## 2023-05-16 NOTE — ED Provider Notes (Signed)
[place] EMERGENCY DEPARTMENT AT [facility] Provider Note   CSN: [id number] Arrival date & time: 05/16/23  0865     History  Chief Complaint  Patient presents with   Otalgia   Emesis    Andrew Castillo is a 27 y.o. male.  HPI 27 year old male presents with left ear pain.  This been there for about a week and is only mildly painful but he has also noticed some muffled hearing.  Seems to be improving.  No fevers.  Over the last 24 hours he has had a lot of vomiting and diarrhea.  This is pretty typical for him whenever he drinks heavily and he drank a couple days ago.  He has some abdominal pain that he feels like his soreness from vomiting.  He was able to drink some fluids earlier this morning but most recently vomited about an hour and a half ago.  Home Medications Prior to Admission medications   Medication Sig Start Date End Date Taking? Authorizing Provider  cloBAZam (ONFI) 10 MG tablet Take 1 tablet (10 mg total) by mouth 2 (two) times daily. 11/30/22 05/29/23  Windell Norfolk, MD  dicyclomine (BENTYL) 10 MG capsule Take 1 capsule (10 mg total) by mouth 4 (four) times daily -  before meals and at bedtime for 7 days. 12/11/22 12/18/22  Iva Boop, MD  fluticasone (FLONASE) 50 MCG/ACT nasal spray SPRAY 2 SPRAYS INTO EACH NOSTRIL EVERY DAY Patient taking differently: Place 2 sprays into both nostrils daily as needed for allergies. 08/22/22   [name], [name], NP-C  haloperidol (HALDOL) 5 MG tablet Take 1 tablet (5 mg total) by mouth every 6 (six) hours as needed (nausea, vomiting). 10/31/22   Iva Boop, MD  levocetirizine (XYZAL) 5 MG tablet TAKE 1 TABLET BY MOUTH EVERY DAY IN THE EVENING Patient taking differently: Take 5 mg by mouth daily as needed for allergies. 08/22/22   [name], [name], NP-C  ondansetron (ZOFRAN) 8 MG tablet Take 1 tablet (8 mg total) by mouth every 8 (eight) hours as needed for nausea or vomiting. 10/21/22   Margarita Grizzle, MD  pantoprazole  (PROTONIX) 40 MG tablet Take 1 tablet (40 mg total) by mouth 2 (two) times daily before a meal. 08/02/22   Cirigliano, Vito V, DO  prochlorperazine (COMPAZINE) 25 MG suppository Place 1 suppository (25 mg total) rectally every 12 (twelve) hours as needed for nausea or vomiting. 10/21/22   Margarita Grizzle, MD  sucralfate (CARAFATE) 1 GM/10ML suspension Take 10 mLs (1 g total) by mouth 4 (four) times daily -  with meals and at bedtime. 09/13/22 01/11/23  Iva Boop, MD      Allergies    Penicillins and Flexeril [cyclobenzaprine]    Review of Systems   Review of Systems  HENT:  Positive for ear pain and sore throat (from vomiting).   Respiratory:  Negative for shortness of breath.   Gastrointestinal:  Positive for abdominal pain, diarrhea, nausea and vomiting.    Physical Exam Updated Vital Signs BP (!) 158/104 Comment: nurse notified  Pulse 91   Temp 99 F (37.2 C) (Oral)   Resp 18   SpO2 94%  Physical Exam Vitals and nursing note reviewed.  Constitutional:      Appearance: He is well-developed.  HENT:     Head: Normocephalic and atraumatic.     Right Ear: Tympanic membrane normal.     Left Ear: Tympanic membrane normal.     Ears:  Comments: There is some mild erythema but no debris in the left ear canal No mastoid tenderness bilaterally Cardiovascular:     Rate and Rhythm: Regular rhythm. Tachycardia present.     Heart sounds: Normal heart sounds.     Comments: HR~100 Pulmonary:     Effort: Pulmonary effort is normal.     Breath sounds: Normal breath sounds.  Abdominal:     Palpations: Abdomen is soft.     Tenderness: There is abdominal tenderness (generalized, mild).  Skin:    General: Skin is warm and dry.  Neurological:     Mental Status: He is alert.     ED Results / Procedures / Treatments   Labs (all labs ordered are listed, but only abnormal results are displayed) Labs Reviewed - No data to display  EKG None  Radiology No results  found.  Procedures Procedures    Medications Ordered in ED Medications - No data to display  ED Course/ Medical Decision Making/ A&P                                 Medical Decision Making  Patient reports his ear pain is improving.  When I look in his ear there is no obvious otitis media.  There is a little bit of erythema in his ear canal but he also has been sticking his finger in there.  However no obvious otitis externa.  He is actually feeling a little better so things reasonable to do no acute interventions and have him follow-up with ENT.  From a vomiting and abdominal pain perspective, he states he has been dealing with this a lot and this is recurrent.  I offered laboratory evaluation and fluids and antiemetics.  However he states he thinks he can handle it at home and wants to go home without further workup.  He did get a little tachycardic moving in the bed and so again I offered fluids but he still declines.  I think this is reasonable but did stress he should return if any symptoms worsen or do not improve.        Final Clinical Impression(s) / ED Diagnoses Final diagnoses:  Left ear pain  Vomiting and diarrhea    Rx / DC Orders ED Discharge Orders     None         Pricilla Loveless, MD 05/16/23 1001

## 2023-05-16 NOTE — Telephone Encounter (Signed)
Patient states that he is having worsening acid reflux. He says that he is still having problems even though Dr. Leone Payor increased his medication. I scheduled him to our next available, 07/29/23 with a PA but patient is wanting to know what he should do in the meantime?

## 2023-05-16 NOTE — Discharge Instructions (Signed)
If your ear pain does not continue to improve or gets worse at any point then follow-up with the ENT listed.  Otherwise be sure to drink extra fluids as it appears you are a little dehydrated today.  If you develop fever, new or worsening symptoms, uncontrolled vomiting or abdominal pain, or any other new/concerning symptoms then return to the ER or call 911.

## 2023-05-16 NOTE — Telephone Encounter (Signed)
I rxed omeprazole 40 bid to see if that worke better  The vomiting may irritate things somewhat  Let's cancel appt w/ Marchelle Folks and find an 1130 or 350 w/ me or use an RN visit or banding appointment in October

## 2023-05-31 ENCOUNTER — Emergency Department (HOSPITAL_COMMUNITY)
Admission: EM | Admit: 2023-05-31 | Discharge: 2023-05-31 | Disposition: A | Discharge status: Home / Self Care | Payer: Medicaid Other | Attending: Emergency Medicine | Admitting: Emergency Medicine

## 2023-05-31 ENCOUNTER — Emergency Department (HOSPITAL_COMMUNITY): Payer: Medicaid Other

## 2023-05-31 ENCOUNTER — Encounter (HOSPITAL_COMMUNITY): Payer: Self-pay | Admitting: *Deleted

## 2023-05-31 ENCOUNTER — Encounter: Payer: Self-pay | Admitting: Internal Medicine

## 2023-05-31 ENCOUNTER — Other Ambulatory Visit: Payer: Self-pay

## 2023-05-31 DIAGNOSIS — M792 Neuralgia and neuritis, unspecified: Secondary | ICD-10-CM | POA: Insufficient documentation

## 2023-05-31 DIAGNOSIS — M545 Low back pain, unspecified: Secondary | ICD-10-CM | POA: Diagnosis present

## 2023-05-31 DIAGNOSIS — D72829 Elevated white blood cell count, unspecified: Secondary | ICD-10-CM | POA: Insufficient documentation

## 2023-05-31 LAB — COMPREHENSIVE METABOLIC PANEL
ALT: 28 U/L (ref 0–44)
AST: 19 U/L (ref 15–41)
Albumin: 4.9 g/dL (ref 3.5–5.0)
Alkaline Phosphatase: 69 U/L (ref 38–126)
Anion gap: 13 (ref 5–15)
BUN: 10 mg/dL (ref 6–20)
CO2: 20 mmol/L — ABNORMAL LOW (ref 22–32)
Calcium: 9.5 mg/dL (ref 8.9–10.3)
Chloride: 102 mmol/L (ref 98–111)
Creatinine, Ser: 0.99 mg/dL (ref 0.61–1.24)
GFR, Estimated: >60 mL/min (ref >60)
Glucose, Bld: 142 mg/dL — ABNORMAL HIGH (ref 70–99)
Potassium: 4.5 mmol/L (ref 3.5–5.1)
Sodium: 135 mmol/L (ref 135–145)
Total Bilirubin: 0.9 mg/dL (ref 0.3–1.2)
Total Protein: 8.3 g/dL — ABNORMAL HIGH (ref 6.5–8.1)

## 2023-05-31 LAB — CBC WITH DIFFERENTIAL/PLATELET
Abs Immature Granulocytes: 0.07 10*3/uL (ref 0.00–0.07)
Basophils Absolute: 0 10*3/uL (ref 0.0–0.1)
Basophils Relative: 0 %
Eosinophils Absolute: 0 10*3/uL (ref 0.0–0.5)
Eosinophils Relative: 0 %
HCT: 47.7 % (ref 39.0–52.0)
Hemoglobin: 16 g/dL (ref 13.0–17.0)
Immature Granulocytes: 1 %
Lymphocytes Relative: 8 %
Lymphs Abs: 1.2 10*3/uL (ref 0.7–4.0)
MCH: 28.3 pg (ref 26.0–34.0)
MCHC: 33.5 g/dL (ref 30.0–36.0)
MCV: 84.3 fL (ref 80.0–100.0)
Monocytes Absolute: 0.4 10*3/uL (ref 0.1–1.0)
Monocytes Relative: 3 %
Neutro Abs: 12.7 10*3/uL — ABNORMAL HIGH (ref 1.7–7.7)
Neutrophils Relative %: 88 %
Platelets: 302 10*3/uL (ref 150–400)
RBC: 5.66 MIL/uL (ref 4.22–5.81)
RDW: 12.9 % (ref 11.5–15.5)
WBC: 14.4 10*3/uL — ABNORMAL HIGH (ref 4.0–10.5)
nRBC: 0 % (ref 0.0–0.2)

## 2023-05-31 LAB — SEDIMENTATION RATE: Sed Rate: 3 mm/h (ref 0–16)

## 2023-05-31 LAB — C-REACTIVE PROTEIN: CRP: 0.6 mg/dL (ref <1.0)

## 2023-05-31 MED ORDER — SODIUM CHLORIDE 0.9 % IV BOLUS
1000.0000 mL | Freq: Once | INTRAVENOUS | Status: AC
  Administered 2023-05-31: 1000 mL via INTRAVENOUS

## 2023-05-31 MED ORDER — GADOBUTROL 1 MMOL/ML IV SOLN
9.0000 mL | Freq: Once | INTRAVENOUS | Status: AC | PRN
  Administered 2023-05-31: 9 mL via INTRAVENOUS

## 2023-05-31 MED ORDER — DEXAMETHASONE SODIUM PHOSPHATE 10 MG/ML IJ SOLN
10.0000 mg | Freq: Once | INTRAMUSCULAR | Status: AC
  Administered 2023-05-31: 10 mg via INTRAVENOUS
  Filled 2023-05-31: qty 1

## 2023-05-31 MED ORDER — GABAPENTIN 100 MG PO CAPS: 100.0000 mg | ORAL_CAPSULE | Freq: Three times a day (TID) | ORAL | 0 refills | Status: DC

## 2023-05-31 MED ORDER — ONDANSETRON HCL 4 MG/2ML IJ SOLN
4.0000 mg | Freq: Once | INTRAMUSCULAR | Status: AC
  Administered 2023-05-31: 4 mg via INTRAVENOUS
  Filled 2023-05-31: qty 2

## 2023-05-31 MED ORDER — PREDNISONE 10 MG (21) PO TBPK: ORAL_TABLET | Freq: Every day | ORAL | 0 refills | Status: DC

## 2023-05-31 MED ORDER — MORPHINE SULFATE (PF) 2 MG/ML IV SOLN
2.0000 mg | Freq: Once | INTRAVENOUS | Status: AC
  Administered 2023-05-31: 2 mg via INTRAVENOUS
  Filled 2023-05-31: qty 1

## 2023-05-31 NOTE — Discharge Instructions (Signed)
You were seen in the emergency department for back pain The MRI showed inflammation of 1 nerve in your lower back (called cauda equina) For this reason we gave you dose of steroids here and would like you to continue taking steroids as directed Please pick up the prescription for prednisone from your pharmacy and begin taking as directed You should also pick up the prescription for gabapentin begin taking as directed for nerve pain Please call the number listed below to follow-up with neurosurgery in the office Return to the emergency department for severe pain, if you are unable to walk, have a loss of urine or bowel or any other concerns

## 2023-05-31 NOTE — Telephone Encounter (Signed)
Called patient to inform him that his order for the omeprazole medication is still viable. The omeprazole ordered was initially written on 05/16/23 with an order for 3 refills. Message left on his VM as per DPR.

## 2023-05-31 NOTE — ED Provider Notes (Signed)
[place] EMERGENCY DEPARTMENT AT [facility] Provider Note   CSN: [id number] Arrival date & time: 05/31/23  6295     History  Chief Complaint  Patient presents with   Back Pain    Andrew Castillo is a 27 y.o. male   The patient is a 27 year old male with a past medical history of seizure disorder who presents to the ED for back pain.  Patient suffered a mechanical fall down 3 stairs on April 27, 2023.  He was seen in the emergency department after the fall and CT showed a right gluteal hematoma but no other acute traumatic findings.  Over the last several days he has developed severe lower back pain with associated numbness bilateral lower extremities worse on the right and 1 episode of fecal incontinence reported at home.  He has only been taking Celebrex to manage his pain at home.  He has been able to walk but it is very painful for him.  Denies fevers, chills and IV drug use.  No prior history of spinal surgeries.  Reports compliance with clobazam.  No recent seizure activity.   Back Pain Associated symptoms: numbness        Home Medications Prior to Admission medications   Medication Sig Start Date End Date Taking? Authorizing Provider  gabapentin (NEURONTIN) 100 MG capsule Take 1 capsule (100 mg total) by mouth 3 (three) times daily. 05/31/23  Yes Royanne Foots, DO  predniSONE (STERAPRED UNI-PAK 21 TAB) 10 MG (21) TBPK tablet Take by mouth daily. Take 6 tabs by mouth daily  for 2 days, then 5 tabs for 2 days, then 4 tabs for 2 days, then 3 tabs for 2 days, 2 tabs for 2 days, then 1 tab by mouth daily for 2 days 05/31/23  Yes Estelle June A, DO  cloBAZam (ONFI) 10 MG tablet Take 1 tablet (10 mg total) by mouth 2 (two) times daily. 11/30/22 05/29/23  Windell Norfolk, MD  dicyclomine (BENTYL) 10 MG capsule Take 1 capsule (10 mg total) by mouth 4 (four) times daily -  before meals and at bedtime for 7 days. 12/11/22 12/18/22  Iva Boop, MD  fluticasone (FLONASE)  50 MCG/ACT nasal spray SPRAY 2 SPRAYS INTO EACH NOSTRIL EVERY DAY Patient taking differently: Place 2 sprays into both nostrils daily as needed for allergies. 08/22/22   [name], [name], NP-C  haloperidol (HALDOL) 5 MG tablet Take 1 tablet (5 mg total) by mouth every 6 (six) hours as needed (nausea, vomiting). 10/31/22   Iva Boop, MD  levocetirizine (XYZAL) 5 MG tablet TAKE 1 TABLET BY MOUTH EVERY DAY IN THE EVENING Patient taking differently: Take 5 mg by mouth daily as needed for allergies. 08/22/22   [name], [name], NP-C  omeprazole (PRILOSEC) 40 MG capsule Take 1 capsule (40 mg total) by mouth 2 (two) times daily before a meal. 30 mins before breakfast and supper 05/16/23   Iva Boop, MD  ondansetron (ZOFRAN) 8 MG tablet Take 1 tablet (8 mg total) by mouth every 8 (eight) hours as needed for nausea or vomiting. 10/21/22   Margarita Grizzle, MD  prochlorperazine (COMPAZINE) 25 MG suppository Place 1 suppository (25 mg total) rectally every 12 (twelve) hours as needed for nausea or vomiting. 10/21/22   Margarita Grizzle, MD  sucralfate (CARAFATE) 1 GM/10ML suspension Take 10 mLs (1 g total) by mouth 4 (four) times daily -  with meals and at bedtime. 09/13/22 01/11/23  Iva Boop, MD  Allergies    Penicillins and Flexeril [cyclobenzaprine]    Review of Systems   Review of Systems  Musculoskeletal:  Positive for back pain.  Neurological:  Positive for numbness.  All other systems reviewed and are negative.   Physical Exam Updated Vital Signs BP (!) 149/90   Pulse 80   Temp 98 F (36.7 C) (Oral)   Resp 16   Ht 6' (1.829 m)   Wt 95.3 kg   SpO2 96%   BMI 28.48 kg/m  Physical Exam Vitals and nursing note reviewed.  HENT:     Head: Normocephalic and atraumatic.  Eyes:     Pupils: Pupils are equal, round, and reactive to light.  Cardiovascular:     Rate and Rhythm: Normal rate and regular rhythm.  Pulmonary:     Effort: Pulmonary effort is normal.     Breath sounds:  Normal breath sounds.  Abdominal:     Palpations: Abdomen is soft.     Tenderness: There is no abdominal tenderness.  Musculoskeletal:     Comments: Midline tenderness over lower lumbar region as well as right lumbar paraspinal tenderness to deep palpation  Skin:    General: Skin is warm and dry.  Neurological:     General: No focal deficit present.     Mental Status: He is alert.     Comments: 5 out of 5 motor strength bilateral lower extremities Sensation tact light touch throughout bilateral lower extremities with no saddle anesthesia   Psychiatric:        Mood and Affect: Mood normal.     ED Results / Procedures / Treatments   Labs (all labs ordered are listed, but only abnormal results are displayed) Labs Reviewed  COMPREHENSIVE METABOLIC PANEL - Abnormal; Notable for the following components:      Result Value   CO2 20 (*)    Glucose, Bld 142 (*)    Total Protein 8.3 (*)    All other components within normal limits  CBC WITH DIFFERENTIAL/PLATELET - Abnormal; Notable for the following components:   WBC 14.4 (*)    Neutro Abs 12.7 (*)    All other components within normal limits  SEDIMENTATION RATE  C-REACTIVE PROTEIN    EKG None  Radiology MR Lumbar Spine W Wo Contrast  Result Date: 05/31/2023 CLINICAL DATA:  Fall 2 weeks ago, LE paresthesias, fecal incontinence, ?epidural abscess vs hematoma EXAM: MRI LUMBAR SPINE WITHOUT AND WITH CONTRAST TECHNIQUE: Multiplanar and multiecho pulse sequences of the lumbar spine were obtained without and with intravenous contrast. CONTRAST:  9mL GADAVIST GADOBUTROL 1 MMOL/ML IV SOLN COMPARISON:  None Available. FINDINGS: Segmentation:  Standard. Alignment:  Physiologic. Vertebrae:  No fracture, evidence of discitis, or bone lesion. Conus medullaris and cauda equina: Conus extends to the L2 level. Nonspecific linear contrast enhancement along a single cauda equina nerve root (series 12, image 34). Paraspinal and other soft tissues:  Negative. Disc levels: T12-L1: Unremarkable L1-L2: Unremarkable L2-L3: Unremarkable L3-L4: Unremarkable L4-L5: Mild bilateral facet degenerative change. No spinal canal or neural foraminal narrowing. L5-S1: Central disc protrusion. Mild narrowing of the right lateral recess. Mild spinal canal narrowing. Mild bilateral facet degenerative change. No neural foraminal narrowing. IMPRESSION: 1. No evidence of epidural hematoma or abscess. 2. Linear contrast enhancement along a single cauda equina nerve root. This is suspicious for nonspecific neuritis, which me secondary to inflammatory or post-traumatic etiologies. Infection is favored to be less likely, but not entirely excluded. 3. Central disc protrusion at L5-S1 with mild narrowing  of the right lateral recess and mild spinal canal narrowing. Electronically Signed   By: Lorenza Cambridge M.D.   On: 05/31/2023 14:42    Procedures Procedures    Medications Ordered in ED Medications  sodium chloride 0.9 % bolus 1,000 mL (1,000 mLs Intravenous New Bag/Given 05/31/23 0945)  morphine (PF) 2 MG/ML injection 2 mg (2 mg Intravenous Given 05/31/23 1011)  ondansetron (ZOFRAN) injection 4 mg (4 mg Intravenous Given 05/31/23 1010)  gadobutrol (GADAVIST) 1 MMOL/ML injection 9 mL (9 mLs Intravenous Contrast Given 05/31/23 1252)  dexamethasone (DECADRON) injection 10 mg (10 mg Intravenous Given 05/31/23 1522)    ED Course/ Medical Decision Making/ A&P Clinical Course as of 05/31/23 1627  Fri May 31, 2023  1234 Laboratory workup was leukocytosis of 14.4.  No significant electrolyte imbalance or renal impairment.  No elevation in CRP or ESR.  Awaiting MRI. [MP]  1521 MRI shows nonspecific neuritis of single cauda equina nerve.  Will reach out to neurosurgery given this finding in the setting of recent trauma to discuss further management [MP]  1613 Discussed with Patrici Ranks neurosurgery PA who agrees with plan for short course of steroids, gabapentin and office  follow-up.  Patient stable for discharge at this time [MP]    Clinical Course User Index [MP] Royanne Foots, DO                                 Medical Decision Making 27 year old male with history as above returns for subacute back pain.  Suffered mechanical fall back on August 10 and was seen here.  CT at that time showed 5.6 cm subcutaneous hematoma in the right posterior gluteal region but no other acute traumatic findings.  Returns today with severe back pain as well as associated bilateral lower extremity numbness and 1 episode of fecal incontinence last night.  No focal neurologic deficits on my exam however his story in the setting of recent trauma could be concerning for epidural hematoma causing spinal cord compression.  Will obtain stat MRI to evaluate for this.  Lower suspicion for spinal epidural abscess as he has no systemic infectious symptoms and no history of IV drug use or prior history of spinal surgeries but will obtain ESR CRP in addition to MRI to evaluate for this as well.  Amount and/or Complexity of Data Reviewed Labs: ordered. Radiology: ordered.  Risk Prescription drug management.           Final Clinical Impression(s) / ED Diagnoses Final diagnoses:  Neuritis  Acute midline low back pain, unspecified whether sciatica present    Rx / DC Orders ED Discharge Orders          Ordered    predniSONE (STERAPRED UNI-PAK 21 TAB) 10 MG (21) TBPK tablet  Daily        05/31/23 1626    gabapentin (NEURONTIN) 100 MG capsule  3 times daily        05/31/23 1626              Royanne Foots, DO 05/31/23 1628

## 2023-05-31 NOTE — ED Triage Notes (Addendum)
Pt states he feel 2 weeks ago down steps. Was seen and evaluated. Pain has increased in lower back with numbness in legs. Pt also states he is dehydrated due to N/V from pain

## 2023-06-26 ENCOUNTER — Ambulatory Visit: Payer: Medicaid Other | Admitting: Internal Medicine

## 2023-07-29 ENCOUNTER — Ambulatory Visit: Payer: Medicaid Other | Admitting: Physician Assistant

## 2023-08-09 ENCOUNTER — Telehealth: Payer: Self-pay | Admitting: Internal Medicine

## 2023-08-09 MED ORDER — OMEPRAZOLE 40 MG PO CPDR: 40.0000 mg | DELAYED_RELEASE_CAPSULE | Freq: Two times a day (BID) | ORAL | 0 refills | Status: DC

## 2023-08-09 MED ORDER — ONDANSETRON HCL 8 MG PO TABS: 8.0000 mg | ORAL_TABLET | Freq: Three times a day (TID) | ORAL | 0 refills | Status: DC | PRN

## 2023-08-09 NOTE — Telephone Encounter (Signed)
PT's partner is calling back to see if a prescription for phenergan can be sent in as well with the omeprazole request. Please advise.

## 2023-08-09 NOTE — Telephone Encounter (Signed)
Inbound call from patient's partner requesting a refill for omeprazole medication to be sent [address] pharmacy on [address]. Please advise, thank you.

## 2023-08-09 NOTE — Telephone Encounter (Signed)
Spoke with Andrew Castillo. Andrew Castillo had knee surgery today in [place]. They told him to call his GI Dr for nausea meds. I have made him an appointment as he has missed several. I sent in a refill on his omeprazole and ondansetron as requested. I told her phenergan isn't on his list. Confirmed [facility] pharmacy as he is staying with Mom.

## 2023-11-06 ENCOUNTER — Ambulatory Visit (INDEPENDENT_AMBULATORY_CARE_PROVIDER_SITE_OTHER): Payer: Medicaid Other | Admitting: Internal Medicine

## 2023-11-06 ENCOUNTER — Encounter: Payer: Self-pay | Admitting: Internal Medicine

## 2023-11-06 VITALS — BP 122/78 | HR 83 | Ht 72.0 in | Wt 236.0 lb

## 2023-11-06 DIAGNOSIS — F1910 Other psychoactive substance abuse, uncomplicated: Secondary | ICD-10-CM

## 2023-11-06 DIAGNOSIS — R635 Abnormal weight gain: Secondary | ICD-10-CM | POA: Diagnosis not present

## 2023-11-06 DIAGNOSIS — R1115 Cyclical vomiting syndrome unrelated to migraine: Secondary | ICD-10-CM

## 2023-11-06 DIAGNOSIS — K2100 Gastro-esophageal reflux disease with esophagitis, without bleeding: Secondary | ICD-10-CM

## 2023-11-06 MED ORDER — OMEPRAZOLE 40 MG PO CPDR: 40.0000 mg | DELAYED_RELEASE_CAPSULE | Freq: Two times a day (BID) | ORAL | 3 refills | Status: AC

## 2023-11-06 MED ORDER — FAMOTIDINE 40 MG PO TABS: 40.0000 mg | ORAL_TABLET | Freq: Every day | ORAL | 3 refills | Status: AC

## 2023-11-06 MED ORDER — ONDANSETRON HCL 8 MG PO TABS: 8.0000 mg | ORAL_TABLET | Freq: Three times a day (TID) | ORAL | 3 refills | Status: DC | PRN

## 2023-11-06 NOTE — Progress Notes (Signed)
 Andrew Castillo 28 y.o. May 21, 1996 [id number]  Assessment & Plan:   Encounter Diagnoses  Name Primary?   Gastroesophageal reflux disease with esophagitis without hemorrhage Yes   Persistent vomiting    Polysubstance abuse (HCC)    Weight gain    The patient is still symptomatic and has been nonadherent with medications.  Will get back on twice daily omeprazole and add famotidine at bedtime.   Return to clinic in 2 to 3 months and reassess.  He should have a repeat EGD ideally I would like him to have that when he has had symptom control for some time.   Meds ordered this encounter  Medications   omeprazole (PRILOSEC) 40 MG capsule    Sig: Take 1 capsule (40 mg total) by mouth 2 (two) times daily before a meal. 30 mins before breakfast and supper    Dispense:  180 capsule    Refill:  3   famotidine (PEPCID) 40 MG tablet    Sig: Take 1 tablet (40 mg total) by mouth at bedtime.    Dispense:  90 tablet    Refill:  3   ondansetron (ZOFRAN) 8 MG tablet    Sig: Take 1 tablet (8 mg total) by mouth every 8 (eight) hours as needed for nausea or vomiting.    Dispense:  30 tablet    Refill:  3    I have once again advised that cessation of marijuana and cocaine is in his best interest.  Reduce starchy foods ("I eat a lot of those") in an effort to lose weight which will help with reflux Subjective:  GI summary:   Persistent vomiting problems cause not clear, normal gastric emptying study 2023, GERD, cannabis stopped no effect, variable response to ondansetron, Reglan, promethazine/prochlorperazine suppositories.  Haldol has helped.  Marijuana plus or minus cocaine likely culprit based upon clinical course into 2024 see below     GERD with esophagitis and 3 cm hiatal hernia, grade B 2017, grade C November 2023 (after coffee-ground emesis)-reactive gastropathy on stomach biopsies, grade B September 13, 2022   Abdominal wall pain at times from vomiting   Polysubstance abuse:  Chronic marijuana user intermittent cocaine user stopped March 2024 and has improved since then or any nausea vomiting   -----------------------------------------------------------------------------------------------   Chief Complaint: GERD follow-up  HPI 28 year old man with problems as outlined above presents for follow-up of his GERD.  He is currently out of his omeprazole and having heartburn for the past several days since he ran out.  When he is on omeprazole he does report significant symptom reduction but still will have some nocturnal reflux.  He eats last meal 3 hours or so before retiring to bed.  2 cups of coffee and 2 Dr. Alcus Dad each week he says as far as caffeine.  He does like starchy foods like potatoes and pasta and has been gaining weight.  He has been out of work and less active, he had a left knee arthroscopy in the fall and reports that that has been helpful.  Vomiting has been an issue at times but it seems to be rare at this point certainly much better than it was in the past.   Last seen April 2024, he was improved stopping cocaine and marijuana.  He was on omeprazole twice daily.  He is not on his antiepileptic medication at this point and has not followed up with neurology recently.  No seizure activity reported.  He attributes the seizure activity and the  use of cocaine.  He says he used the cocaine 1 time in the last several months "and only to celebrate my birthday".  Back to smoking marijuana daily.  He says it helps stimulate appetite and relaxes him.  Little to no alcohol.  Wt Readings from Last 3 Encounters:  11/06/23 236 lb (107 kg)  05/31/23 210 lb (95.3 kg)  12/18/22 204 lb 9.6 oz (92.8 kg)      Allergies  Allergen Reactions   Penicillins Hives    Did it involve swelling of the face/tongue/throat, SOB, or low BP? Unknown Did it involve sudden or severe rash/hives, skin peeling, or any reaction on the inside of your mouth or nose? Unknown Did you  need to seek medical attention at a hospital or doctor's office? Unknown When did it last happen?   Pt doesn't remember he said he was a child    If all above answers are "NO", may proceed with cephalosporin use.    Flexeril [Cyclobenzaprine]     seizures   No current medications. Past Medical History:  Diagnosis Date   Anxiety and depression    Childhood asthma    Cocaine abuse (HCC)    Environmental allergies    GERD (gastroesophageal reflux disease)    Headache    migraines   Panic attacks    Seasonal allergic conjunctivitis    Seizures (HCC)    caused by flexiril 01/2022   Past Surgical History:  Procedure Laterality Date   ESOPHAGOGASTRODUODENOSCOPY     Left Knee Menuiscus     "several surguries on same knee"   NM ESOPHAGEAL REFLUX     Right Shoulder Surgery     TONSILLECTOMY     adenoids   UPPER GI ENDOSCOPY     Social History   Social History Narrative   Single-engaged, 1 child lives with fianc   Marijuana user Cigarette smoker   no current alcohol no other drugs   He is an Designer, television/film set   family history includes ADD / ADHD in his brother; Anxiety disorder in his maternal aunt, mother, and sister; Bone cancer in his paternal grandmother; Clotting disorder in his mother; Crohn's disease in his mother; Depression in his maternal aunt; Diabetes in his maternal grandmother, paternal aunt, and paternal grandfather; Drug abuse in his paternal uncle; Fibromyalgia in his mother and paternal aunt; Heart disease in his paternal grandfather; Hyperlipidemia in his father; Hypertension in his father and maternal grandmother; Liver disease in his father and maternal grandmother; Lupus in his maternal aunt, mother, and paternal aunt; Migraines in an other family member; Seizures in his brother; Thyroid disease in his mother.   Review of Systems As per HPI  Objective:   Physical Exam BP 122/78   Pulse 83   Ht 6' (1.829 m)   Wt 236 lb (107 kg)   BMI 32.01 kg/m

## 2023-11-06 NOTE — Patient Instructions (Signed)
 We have sent the following medications to your pharmacy for you to pick up at your convenience: Omeprazole, ondansetron, famotidine   Reduce starches in your diet.  We recommend you stop all mariajuana and cocaine.  Go online or to a local medical supply store and purchase a wedge pillow or raise the head of your bed.  We have given you GERD information to read and follow today.   I appreciate the opportunity to care for you. Stan Head, MD, [facility]

## 2023-11-11 ENCOUNTER — Telehealth: Payer: Self-pay | Admitting: Internal Medicine

## 2023-11-11 MED ORDER — PROCHLORPERAZINE 25 MG RE SUPP: 25.0000 mg | Freq: Two times a day (BID) | RECTAL | 1 refills | Status: DC | PRN

## 2023-11-11 MED ORDER — ONDANSETRON HCL 8 MG PO TABS: 8.0000 mg | ORAL_TABLET | Freq: Three times a day (TID) | ORAL | 2 refills | Status: AC | PRN

## 2023-11-11 NOTE — Telephone Encounter (Signed)
 Dr Denton Brick  the compazine refill and I discovered his last zofran went to [facility]. I sent in the zofran refill to the [facility] as requested. I have left him a detailed message that both his meds have been sent in as requested.

## 2023-11-11 NOTE — Telephone Encounter (Signed)
 Patient called and stated that he was still having issue with throwing up and was wondering if he could get a refill for the Zofran 8 MG and the COMPAZINE 25 MG. Patient did request that those medication be sent over to the [facility] on [address] in [place]. Please advise.

## 2023-11-11 NOTE — Telephone Encounter (Signed)
 I tried to reach [place] and got his voice mail. I left him a detailed message that Dr Leone Payor gave him 3 refills on his zofran 8mg  when he was here at his visit 11/06/2023. I will route to Doc of the Day to advise about sending in the compazine 25mg  suppositories. They were sent in by the ER Doctor in the past.

## 2023-11-18 ENCOUNTER — Telehealth: Payer: Self-pay

## 2023-11-18 NOTE — Telephone Encounter (Signed)
 Pharmacy Patient Advocate Encounter   Received notification from CoverMyMeds that prior authorization for Compro 25 mg suppositories is required/requested.   Insurance verification completed.   The patient is insured through  [facility]  .   Per test claim: PA required; PA submitted to above mentioned insurance via CoverMyMeds Key/confirmation #/EOC [id number] Status is pending

## 2023-11-19 ENCOUNTER — Emergency Department (HOSPITAL_COMMUNITY)
Admission: EM | Admit: 2023-11-19 | Discharge: 2023-11-20 | Disposition: A | Discharge status: Home / Self Care | Attending: Emergency Medicine | Admitting: Emergency Medicine

## 2023-11-19 ENCOUNTER — Other Ambulatory Visit: Payer: Self-pay

## 2023-11-19 ENCOUNTER — Ambulatory Visit (HOSPITAL_COMMUNITY)
Admission: EM | Admit: 2023-11-19 | Discharge: 2023-11-19 | Disposition: A | Discharge status: Home / Self Care | Attending: Psychiatry | Admitting: Psychiatry

## 2023-11-19 ENCOUNTER — Encounter (HOSPITAL_COMMUNITY): Payer: Self-pay | Admitting: Emergency Medicine

## 2023-11-19 DIAGNOSIS — F12188 Cannabis abuse with other cannabis-induced disorder: Secondary | ICD-10-CM | POA: Diagnosis not present

## 2023-11-19 DIAGNOSIS — F1410 Cocaine abuse, uncomplicated: Secondary | ICD-10-CM | POA: Insufficient documentation

## 2023-11-19 DIAGNOSIS — R112 Nausea with vomiting, unspecified: Secondary | ICD-10-CM

## 2023-11-19 DIAGNOSIS — F1990 Other psychoactive substance use, unspecified, uncomplicated: Secondary | ICD-10-CM | POA: Diagnosis present

## 2023-11-19 DIAGNOSIS — F32A Depression, unspecified: Secondary | ICD-10-CM | POA: Insufficient documentation

## 2023-11-19 DIAGNOSIS — R1084 Generalized abdominal pain: Secondary | ICD-10-CM | POA: Diagnosis not present

## 2023-11-19 DIAGNOSIS — F1290 Cannabis use, unspecified, uncomplicated: Secondary | ICD-10-CM | POA: Insufficient documentation

## 2023-11-19 DIAGNOSIS — R1115 Cyclical vomiting syndrome unrelated to migraine: Secondary | ICD-10-CM | POA: Insufficient documentation

## 2023-11-19 DIAGNOSIS — F419 Anxiety disorder, unspecified: Secondary | ICD-10-CM | POA: Insufficient documentation

## 2023-11-19 DIAGNOSIS — K2100 Gastro-esophageal reflux disease with esophagitis, without bleeding: Secondary | ICD-10-CM | POA: Insufficient documentation

## 2023-11-19 LAB — RESP PANEL BY RT-PCR (RSV, FLU A&B, COVID)  RVPGX2
Influenza A by PCR: NEGATIVE
Influenza B by PCR: NEGATIVE
Resp Syncytial Virus by PCR: NEGATIVE
SARS Coronavirus 2 by RT PCR: NEGATIVE

## 2023-11-19 LAB — CBC
HCT: 48 % (ref 39.0–52.0)
Hemoglobin: 16.2 g/dL (ref 13.0–17.0)
MCH: 28.2 pg (ref 26.0–34.0)
MCHC: 33.8 g/dL (ref 30.0–36.0)
MCV: 83.6 fL (ref 80.0–100.0)
Platelets: 336 10*3/uL (ref 150–400)
RBC: 5.74 MIL/uL (ref 4.22–5.81)
RDW: 13.1 % (ref 11.5–15.5)
WBC: 19.6 10*3/uL — ABNORMAL HIGH (ref 4.0–10.5)
nRBC: 0 % (ref 0.0–0.2)

## 2023-11-19 LAB — COMPREHENSIVE METABOLIC PANEL
ALT: 52 U/L — ABNORMAL HIGH (ref 0–44)
AST: 25 U/L (ref 15–41)
Albumin: 4.6 g/dL (ref 3.5–5.0)
Alkaline Phosphatase: 71 U/L (ref 38–126)
Anion gap: 12 (ref 5–15)
BUN: 8 mg/dL (ref 6–20)
CO2: 26 mmol/L (ref 22–32)
Calcium: 9.8 mg/dL (ref 8.9–10.3)
Chloride: 102 mmol/L (ref 98–111)
Creatinine, Ser: 1.2 mg/dL (ref 0.61–1.24)
GFR, Estimated: >60 mL/min (ref >60)
Glucose, Bld: 123 mg/dL — ABNORMAL HIGH (ref 70–99)
Potassium: 3.6 mmol/L (ref 3.5–5.1)
Sodium: 140 mmol/L (ref 135–145)
Total Bilirubin: 0.9 mg/dL (ref 0.0–1.2)
Total Protein: 8 g/dL (ref 6.5–8.1)

## 2023-11-19 LAB — LIPASE, BLOOD: Lipase: 34 U/L (ref 11–51)

## 2023-11-19 MED ORDER — ONDANSETRON HCL 4 MG/2ML IJ SOLN: 4.0000 mg | Freq: Once | INTRAMUSCULAR | Status: AC

## 2023-11-19 MED ORDER — ONDANSETRON HCL 4 MG/2ML IJ SOLN
INTRAMUSCULAR | Status: AC
  Administered 2023-11-19: 4 mg via INTRAVENOUS
  Filled 2023-11-19: qty 2

## 2023-11-19 NOTE — ED Notes (Signed)
 Pt states that after medical clearance, he will need Safe Transport to get to [facility].

## 2023-11-19 NOTE — ED Notes (Signed)
 Removed PIV per pt request due to discomfort.

## 2023-11-19 NOTE — ED Provider Notes (Addendum)
 Behavioral Health Urgent Care Medical Screening Exam  Patient Name: Andrew Castillo MRN: [id number] Date of Evaluation: 11/19/23 Chief Complaint:  "I need to quit using cocaine".  He is also complaining of profuse nausea and vomiting Diagnosis:  Final diagnoses:  Cocaine abuse (HCC)  Nausea and vomiting, unspecified vomiting type    History of Present illness: Andrew Castillo is a 28 y.o. male patient presented to [facility] as a walk in accompanied by his mother with complaints of "I need to quit using cocaine".  He is also complaining of profuse nausea and vomiting  [name], 28 y.o., male patient seen face to face by this provider, chart reviewed, and consulted with Dr. Woodroe Mode on 11/19/23.   Patient reports a past psychiatric history that includes depression, anxiety, and cocaine use disorder.  He has participated in the [facility] OP program in the past but did not finish program.  He has a medical history that includes gastroesophageal reflux disease with esophagitis without hemorrhage, persistent vomiting, and weight gain.  He is currently unemployed and lives with his mom.  He is currently not prescribed any psychiatric medications.  He has no psychiatric services in place.  He denies any previous history of suicide attempt or psychiatric admissions.  Patient's mother present throughout assessment at his request.  On evaluation Andrew Castillo reports he has been addicted to cocaine for years and presents seeking cocaine detox.  He admits to using marijuana daily for the past 5 years, and alcohol occasionally.  He denies any other substance use.  He reports snorting cocaine daily, he goes through one half a gram of cocaine over a period of 3 days.  His last cocaine and alcohol use was 1 week ago.  His last marijuana use was yesterday. He identifies his fiance and 71-year-old daughter as his motivating factors for treatment.   During evaluation Andrew Castillo is alert/oriented x 4   cooperative, attentive. He spoke in a clear tone at moderate volume, and normal pace, with fair eye contact.  He endorses depression with feelings of helplessness, hopelessness tearfulness, guilt, and worthlessness.  He identifies his depression mostly in the context of cocaine and marijuana use.  He has taken medications for depression in the past but currently has no provider.  He is interested in possibly starting Trintellix for depression, it has worked well for other family members.  He denies suicidal/self-harm/homicidal ideation, psychosis, and paranoia. He denies auditory/visual hallucinations.  Objectively there is no evidence of psychosis/mania or delusional thinking.  He does not appear to be responding to internal/external stimuli  Patient diaphoretic and profusely vomited in the lobby and while in the assessment room.  Patient has a history of systolic vomiting related to cocaine and cannabis use.  He has been profusely vomiting and having diarrhea for the past 7 days with no relief.  He has taken Phenergan, Zofran, and Compazine suppositories with no relief.  It has been difficult for him to keep any type of liquid or food down due to vomiting shortly thereafter.  He reports he has had these symptoms roughly 3 times in his life and it normally passes. He is also complaining of upper gastric pain as a burning pain.  He is fatigued and is having chills.  Patient's vital signs elevated.  BP 168/116, temp 99.1, pulse rate 96.  He finds it difficult to state if he is having any withdrawal symptoms because he feels terrible all over.  He reports his last seizure  was roughly 1 year ago and it was in the context of cocaine use.  Offered admission to the facility based crisis unit for treatment.  However, discussed he would need to be evaluated in the emergency department due to the severity of his symptoms for medical clearance.  He declines being transferred to the emergency department via EMS.  Explained  that his condition could worsen and the recommendation is to be transported via EMS. He continued to decline and would rather have his mother transport him she is a Designer, jewellery, and mother states, "we will be fine I will take good care of him".  They do agree to go to the emergency room to be evaluated.       Flowsheet Row ED from 05/31/2023 in [facility] Emergency Department at [facility] ED from 05/16/2023 in [facility] Emergency Department at [facility] ED from 12/07/2022 in [facility]  C-SSRS RISK CATEGORY No Risk No Risk No Risk       Psychiatric Specialty Exam  Presentation  General Appearance:Casual  Eye Contact:Fleeting  Speech:Clear and Coherent; Normal Rate  Speech Volume:Normal  Handedness:Right   Mood and Affect  Mood: Anxious; Depressed  Affect: Congruent   Thought Process  Thought Processes: Coherent  Descriptions of Associations:Intact  Orientation:Full (Time, Place and Person)  Thought Content:Logical  Diagnosis of Schizophrenia or Schizoaffective disorder in past: No   Hallucinations:None  Ideas of Reference:None  Suicidal Thoughts:No  Homicidal Thoughts:No   Sensorium  Memory: Immediate Good; Recent Good; Remote Good  Judgment: Fair  Insight: Fair   Art therapist  Concentration: Fair  Attention Span: Fair  Recall: Good  Fund of Knowledge: Good  Language: Good   Psychomotor Activity  Psychomotor Activity: Normal   Assets  Assets: Communication Skills; Housing; Intimacy; Leisure Time; Resilience; Social Support; Financial Resources/Insurance   Sleep  Sleep: Fair  Number of hours: No data recorded  Physical Exam: Physical Exam Constitutional:      Appearance: He is ill-appearing and diaphoretic.  Cardiovascular:     Rate and Rhythm: Normal rate.  Pulmonary:     Effort: Pulmonary effort is normal. No respiratory distress.  Abdominal:      Tenderness: There is abdominal tenderness.  Musculoskeletal:        General: Normal range of motion.  Neurological:     Mental Status: He is oriented to person, place, and time.  Psychiatric:        Attention and Perception: Attention and perception normal.        Mood and Affect: Mood is anxious and depressed.        Speech: Speech normal.        Behavior: Behavior normal. Behavior is cooperative.        Thought Content: Thought content normal.        Cognition and Memory: Cognition normal.        Judgment: Judgment normal.    Review of Systems  Constitutional:  Positive for chills, diaphoresis, fever and malaise/fatigue.  Respiratory:  Negative for cough and shortness of breath.   Cardiovascular:  Negative for chest pain.  Gastrointestinal:  Positive for abdominal pain, diarrhea, nausea and vomiting.  Neurological:  Negative for seizures.  Psychiatric/Behavioral:  Positive for depression and substance abuse. The patient is nervous/anxious.    Blood pressure (!) 168/116, pulse 96, temperature 99.1 F (37.3 C), temperature source Oral, resp. rate 18, SpO2 98%. There is no height or weight on file to calculate BMI.  Musculoskeletal: Strength & Muscle Tone: within normal limits Gait & Station: normal Patient leans: N/A   [facility] MSE Discharge Disposition for Follow up and Recommendations: Patient discharged-Patient and his mother agreed to present to local emergency department, they are unsure at this time which ED they will go to.  This provider offered to call the ED to inform EDP that patient would be coming but they were unwilling to confirm which ED that they would prefer.  They declined transportation via EMS.   Once medically cleared patient meets criteria for treatment in the facility based crisis unit [facility]).   He has upcoming appointment with Hessie Diener for the [facility] IOP program, 11/21/2023.  Provided additional outpatient resources for medication management, therapy, and  substance abuse.   Ardis Hughs, NP 11/19/2023, 6:23 PM

## 2023-11-19 NOTE — ED Provider Triage Note (Signed)
 Emergency Medicine Provider Triage Evaluation Note  Andrew Castillo , a 28 y.o. male  was evaluated in triage.  Pt complains of vomiting, this has been recurrent.  States that it feels better when he takes a hot shower or hot bath.  History of marijuana abuse..  Review of Systems  Positive: Vomiting and abdominal pain Negative: EVAR  Physical Exam  BP (!) 176/119 (BP Location: Right Arm)   Pulse 92   Temp 99.5 F (37.5 C)   Resp 18   SpO2 98%  Gen:   Awake, no distress   Resp:  Normal effort  MSK:   Moves extremities without difficulty  Other:    Medical Decision Making  Medically screening exam initiated at 6:48 PM.  Appropriate orders placed.  [name] was informed that the remainder of the evaluation will be completed by another provider, this initial triage assessment does not replace that evaluation, and the importance of remaining in the ED until their evaluation is complete.     Arthor Captain, PA-C 11/19/23 1918

## 2023-11-19 NOTE — ED Triage Notes (Signed)
 Pt reports ongoing vomiting x 1 week and ABD pain 9/10 described as "burning." Actively vomiting in triage, verbal order received for 4 mg Zofran. Pt states this usually happens when he uses cocaine. Denies using today.

## 2023-11-19 NOTE — Discharge Instructions (Addendum)
Substance Abuse Treatment Programs ° °Intensive Outpatient Programs °[facility]     °[address]      °[place], [place]                   °[phone]      ° °[facility] °[address] °[place], [place] °[phone] ° °[facility]     °(Inpatient and outpatient)     °[address]           °[phone]   ° °[facility] °[phone] (Suboxone and Methadone) ° °[address]      °[place], [place] [address]      °[phone]      ° °[address] °[place], [place] °[phone] ° °[facility] (Outpatient/Inpatient, [facility])    °(insurance only) [phone]      °       °[facility] (Groups & Residential) °[place], [place] °[phone] ° °   °[facility]     °[address]     °[place], [place]      °[phone]      ° °[facility] (for caregivers and family) °[address] °[place], [place] °[phone] ° ° ° ° ° °Residential Treatment Programs °[facility]      °[address], [place], [place] [address]  °[phone]      ° °[facility] °[address] [place], [place] [address] °[phone] ° °[facility]        °[phone]      ° °[facility] °[phone] ° °[facility]             °[address]                                         °[place], [place]                                                °[phone] or [phone]                              ° °[facility] [place] °[address] °[place], [address] °[phone] ° °[facility]    °[address]      °, [place]     °[phone]      ° °[facility] °[address] °, [place] °[phone] ° °[facility]   °[address]     °[place], [place] [address]     °[phone]      °Admissions: 8am-3pm M-F ° °[facility] °[address] °[place],  [place] °[phone] ° °[facility]: Residential Program (90 Days)   °[place], [place]      °[phone] or [phone]    ° °[facility] °[place], [place] °(Walk in Hours over the weekend or by referral) ° °[facility] °[address], [place], [place] [address] °([phone] ° °Crisis Mobile: [facility]:  1-877-626-1772 (for crisis response 24 hours a day) °[facility] Hotline:      1-800-256-2452 °Outpatient Psychiatry and Counseling ° °[facility]: Mobile Crisis   Crisis Management 24 hours:  1-877-626-1772  [facility] sliding scale fee and walk in schedule: M-F 8am-12pm/1pm-3pm [address]  [place], [place] [address] [phone]  [facility] [address] [place], [place] [address] [phone]  [facility] ([facility])- new patient walk-in appointments available Monday - Friday 8am -3pm.          [address] [place], [place] [address] [phone] or crisis line- [phone]  [facility]/ Intensive Outpatient Therapy Program [address] [place], [place] [address] [phone]  [facility]                  Crisis Services      [phone]      [address]     [place], [place] [address]                 [facility]   [facility] [phone] [address] [place], [place] [address]   [facility]          [address],  [place], [place] [address]       [phone]  [facility] [address] [place], [place] [address] [phone]  [facility]    [address]    [place], [place] [address]     [phone]       Parish McKinney, MD     [address]     [place] [address]     [phone]       [facility] [address] [place] [address]  [facility]     [address]     [place], [place]      [phone]       [facility] Shamsher Ahluwalia, MD [address] [place], [place] [address] [phone]  [facility]     [address]     [place], [place] [address]     [phone]       [facility] [address] [place],  [address] [phone] Resources for Temporary Residential Assistance/Crisis Centers 

## 2023-11-20 ENCOUNTER — Emergency Department (HOSPITAL_COMMUNITY)

## 2023-11-20 ENCOUNTER — Ambulatory Visit (HOSPITAL_COMMUNITY)
Admission: EM | Admit: 2023-11-20 | Discharge: 2023-11-20 | Disposition: A | Discharge status: Home / Self Care | Source: Home / Self Care | Attending: Psychiatry | Admitting: Psychiatry

## 2023-11-20 DIAGNOSIS — F1990 Other psychoactive substance use, unspecified, uncomplicated: Secondary | ICD-10-CM | POA: Insufficient documentation

## 2023-11-20 MED ORDER — IOHEXOL 350 MG/ML SOLN
75.0000 mL | Freq: Once | INTRAVENOUS | Status: AC | PRN
  Administered 2023-11-20: 75 mL via INTRAVENOUS

## 2023-11-20 MED ORDER — DROPERIDOL 2.5 MG/ML IJ SOLN
1.2500 mg | Freq: Once | INTRAMUSCULAR | Status: AC
  Administered 2023-11-20: 1.25 mg via INTRAVENOUS
  Filled 2023-11-20: qty 2

## 2023-11-20 MED ORDER — SODIUM CHLORIDE 0.9 % IV BOLUS
1000.0000 mL | Freq: Once | INTRAVENOUS | Status: AC
  Administered 2023-11-20: 1000 mL via INTRAVENOUS

## 2023-11-20 NOTE — Discharge Instructions (Signed)
 While you are in the emergency room, you had blood work done that showed a mild elevation in your white blood cell count, otherwise your blood work was normal.  You had a CT scan of your abdomen that was normal.  I believe that your symptoms are related to your frequent marijuana use.  We call this cannabinoid hyperemesis syndrome.  Symptoms can often last for 1 or more weeks after marijuana cessation.  You are medically cleared to begin in any inpatient or outpatient program.

## 2023-11-20 NOTE — ED Notes (Signed)
Pt verbalized understanding of discharge instructions. Pt ambulated from ed with steady gait.  

## 2023-11-20 NOTE — ED Provider Notes (Signed)
 [place] EMERGENCY DEPARTMENT AT [facility] Provider Note   CSN: [id number] Arrival date & time: 11/19/23  1755     History  Chief Complaint  Patient presents with   Abdominal Pain   Emesis    Andrew Castillo is a 28 y.o. male.  28 year old male here today for vomiting.  Patient has a history of daily marijuana use, cocaine use.  Went to outpatient detox center who sent him here for medical clearance.   Abdominal Pain Associated symptoms: vomiting   Emesis Associated symptoms: abdominal pain        Home Medications Prior to Admission medications   Medication Sig Start Date End Date Taking? Authorizing Provider  famotidine (PEPCID) 40 MG tablet Take 1 tablet (40 mg total) by mouth at bedtime. 11/06/23  Yes Iva Boop, MD  omeprazole (PRILOSEC) 40 MG capsule Take 1 capsule (40 mg total) by mouth 2 (two) times daily before a meal. 30 mins before breakfast and supper 11/06/23  Yes Iva Boop, MD  ondansetron (ZOFRAN) 8 MG tablet Take 1 tablet (8 mg total) by mouth every 8 (eight) hours as needed for nausea or vomiting. 11/11/23  Yes Iva Boop, MD  cloBAZam (ONFI) 10 MG tablet Take 1 tablet (10 mg total) by mouth 2 (two) times daily. 11/30/22 05/29/23  Windell Norfolk, MD      Allergies    Penicillins and Flexeril [cyclobenzaprine]    Review of Systems   Review of Systems  Gastrointestinal:  Positive for abdominal pain and vomiting.    Physical Exam Updated Vital Signs BP (!) 149/105 (BP Location: Right Arm)   Pulse 96   Temp 98.2 F (36.8 C) (Oral)   Resp 16   SpO2 95%  Physical Exam Vitals reviewed.  Eyes:     Extraocular Movements: Extraocular movements intact.  Abdominal:     Palpations: Abdomen is soft.     Tenderness: There is generalized abdominal tenderness.  Neurological:     General: No focal deficit present.  Psychiatric:        Mood and Affect: Mood normal.        Behavior: Behavior normal.     ED Results / Procedures  / Treatments   Labs (all labs ordered are listed, but only abnormal results are displayed) Labs Reviewed  COMPREHENSIVE METABOLIC PANEL - Abnormal; Notable for the following components:      Result Value   Glucose, Bld 123 (*)    ALT 52 (*)    All other components within normal limits  CBC - Abnormal; Notable for the following components:   WBC 19.6 (*)    All other components within normal limits  RESP PANEL BY RT-PCR (RSV, FLU A&B, COVID)  RVPGX2  LIPASE, BLOOD    EKG None  Radiology CT ABDOMEN PELVIS W CONTRAST Result Date: 11/20/2023 CLINICAL DATA:  Abdominal pain and vomiting. EXAM: CT ABDOMEN AND PELVIS WITH CONTRAST TECHNIQUE: Multidetector CT imaging of the abdomen and pelvis was performed using the standard protocol following bolus administration of intravenous contrast. RADIATION DOSE REDUCTION: This exam was performed according to the departmental dose-optimization program which includes automated exposure control, adjustment of the mA and/or kV according to patient size and/or use of iterative reconstruction technique. CONTRAST:  75mL OMNIPAQUE IOHEXOL 350 MG/ML SOLN COMPARISON:  04/27/2023 FINDINGS: Lower chest: Unremarkable. Hepatobiliary: No suspicious focal abnormality within the liver parenchyma. Gallbladder is nondistended. No intrahepatic or extrahepatic biliary dilation. Pancreas: No focal mass lesion. No dilatation of the  main duct. No intraparenchymal cyst. No peripancreatic edema. Spleen: No splenomegaly. No suspicious focal mass lesion. Adrenals/Urinary Tract: No adrenal nodule or mass. Tiny well-defined homogeneous low-density lesion in the right kidney is too small to characterize but statistically most likely benign and probably a cyst. No followup imaging is recommended. Left kidney unremarkable. No evidence for hydroureter. The urinary bladder appears normal for the degree of distention. Stomach/Bowel: Stomach is unremarkable. No gastric wall thickening. No evidence  of outlet obstruction. Duodenum is normally positioned as is the ligament of Treitz. No small bowel wall thickening. No small bowel dilatation. The terminal ileum is normal. The appendix is normal. No gross colonic mass. No colonic wall thickening. Vascular/Lymphatic: No abdominal aortic aneurysm. No abdominal aortic atherosclerotic calcification. There is no gastrohepatic or hepatoduodenal ligament lymphadenopathy. No retroperitoneal or mesenteric lymphadenopathy. No pelvic sidewall lymphadenopathy. Reproductive: The prostate gland and seminal vesicles are unremarkable. Other: No intraperitoneal free fluid. Musculoskeletal: No worrisome lytic or sclerotic osseous abnormality. IMPRESSION: No acute findings in the abdomen or pelvis. Specifically, no findings to explain the patient's history of abdominal pain and vomiting. Specifically, no evidence for bowel obstruction. Electronically Signed   By: Kennith Center M.D.   On: 11/20/2023 06:24    Procedures Procedures    Medications Ordered in ED Medications  ondansetron (ZOFRAN) injection 4 mg (4 mg Intravenous Given 11/19/23 1839)  droperidol (INAPSINE) 2.5 MG/ML injection 1.25 mg (1.25 mg Intravenous Given 11/20/23 0443)  sodium chloride 0.9 % bolus 1,000 mL (1,000 mLs Intravenous New Bag/Given 11/20/23 0444)  iohexol (OMNIPAQUE) 350 MG/ML injection 75 mL (75 mLs Intravenous Contrast Given 11/20/23 0532)    ED Course/ Medical Decision Making/ A&P                                 Medical Decision Making 28 year old male here today for vomiting.  Differential diagnoses include cannabinoid hyperemesis, less likely acute intra-abdominal surgical process, less likely obstruction, gastritis, less likely appendicitis, less likely cholecystitis.  Plan-I believe the patient's symptoms are likely cannabinoid hyperemesis.  He is a daily marijuana user.  Does not have a significant alcohol use history, no signs of alcohol withdrawal.  Will provide the patient with  droperidol.  Will obtain imaging of the patient's abdomen.  Reassessment 6:30 AM-patient's vomiting has resolved with droperidol.  His CT imaging shows no acute intra-abdominal process.  Renal function is normal.  Mild elevation in his ALT.  Patient medically cleared.  Will plan to discharge patient.  Amount and/or Complexity of Data Reviewed Labs: ordered. Radiology: ordered.  Risk Prescription drug management.           Final Clinical Impression(s) / ED Diagnoses Final diagnoses:  Nausea and vomiting, unspecified vomiting type  Cannabinoid hyperemesis syndrome    Rx / DC Orders ED Discharge Orders     None         Anders Simmonds T, DO 11/20/23 [phone]

## 2023-11-20 NOTE — Progress Notes (Signed)
   11/20/23 0839  [facility] Triage Screening (Walk-ins at [facility] only)  How Did You Hear About [place]? Family/Friend  What Is the Reason for Your Visit/Call Today? Andrew Castillo is a 28 year old male presenting to [facility] accompanied by grandmother. Pt reports he was here yesterday and spoke to a provider about being tranfered to [facility] for inpatient. Pt is looking to be checked in for inpatient at [facility]. Pt reports he uses alcohol, cocaine and marijuana daily. Pt mentions he has been using these substances for 5 years. Pt reports he last used cocaine and alcohol a week ago. Pt also mentions he smoked marijuana 3 days ago. Pt reports passive hallucinations, but he has not had any recently. Pt has never been inpatient before. Pt is taking his prescribed medication. Pt is starting therapy on Thursday at 11am. Pt denies substance use in the past 24 hr, Si, Hi and Avh.  How Long Has This Been Causing You Problems? > than 6 months  Have You Recently Had Any Thoughts About Hurting Yourself? No  Are You Planning to Commit Suicide/Harm Yourself At This time? No  Have you Recently Had Thoughts About Hurting Someone Karolee Ohs? No  Are You Planning To Harm Someone At This Time? No  Physical Abuse Denies  Verbal Abuse Denies  Sexual Abuse Denies  Exploitation of patient/patient's resources Denies  Self-Neglect Denies  Possible abuse reported to: Other (Comment)  Are you currently experiencing any auditory, visual or other hallucinations? No  Have You Used Any Alcohol or Drugs in the Past 24 Hours? No  Do you have any current medical co-morbidities that require immediate attention? No  Clinician description of patient physical appearance/behavior: calm, cooperative  What Do You Feel Would Help You the Most Today? Alcohol or Drug Use Treatment  Determination of Need Urgent (48 hours)  Options For Referral Inpatient Hospitalization  Determination of Need filed? Yes

## 2023-11-21 ENCOUNTER — Ambulatory Visit (INDEPENDENT_AMBULATORY_CARE_PROVIDER_SITE_OTHER): Admitting: Licensed Clinical Social Worker

## 2023-11-21 DIAGNOSIS — F1920 Other psychoactive substance dependence, uncomplicated: Secondary | ICD-10-CM | POA: Diagnosis not present

## 2023-11-21 DIAGNOSIS — F39 Unspecified mood [affective] disorder: Secondary | ICD-10-CM

## 2023-11-21 NOTE — Progress Notes (Signed)
 THERAPIST PROGRESS NOTE  Session Time: 11:01 a.m. to 12:01 a.m.   Type of Therapy: Individual   Therapist Response/Interventions: Other/The therapist reviews treatment options wit Andrew Castillo and discusses the fact that recovery will require a complete effort and work and that he will need to remain fully engaged at least until he has a year sober from all substances though continued meeting attendance and service after that is encouraged. The therapist emphasizes that Andrew Castillo is running a marathon and not a sprint.   Treatment Goals addressed: will complete Andrew Castillo's Person-Centered Plan tomorrow. Andrew Castillo wants his depression and anxiety to no longer be severe and to be able to abstain completely from drugs and alcohol.   Summary: Andrew Castillo presents today having not see in this therapist in about a year. He expresses an interest in attending the SA IOP.  He says that he last used marijuana and alcohol on Sunday night and last used cocaine about a week ago. His father gave him a narcotic pain pill on Sunday and his mom gave him a narcotic patch on one occasion reportedly for pain as well. Andrew Castillo says that he has not done mushrooms in about a year.   After he met wit this therapist for a CCA almost a year ago, he says that he "did good for a month." He never attended any 12 Step meetings but was calling the AA toll free line on occasions for support. Eventually, he drank a couple of beers which led to buying cocaine and weed and then to snorting cocaine daily and smoking weed daily as well. He kept getting sick due to cannabis hyperemesis but got medications from a GI doctor which helped so thought he was okay; however, it got very bad when he ran out.   He experienced a "cycle of two weeks" in which he was vomiting frequently. Additionally, he says that his relationship with fiance and mother of his child was starting to fall apart. He lost his job as well as he had a seizure and was out on FMLA; however,  when he ran out of [employer], he was fired in June of last year. When he was working, he missed work at least once a week or once every two weeks due to substance use.    Presently, he is taking Zofran, Pepcid, and Prilosec. After his CCA almost a year ago, he saw a psychiatrist who put him on Remeron but stopped it as it made him too sleepy and made him gain weight. He says that he has gained about 30 lbs in the past year. He says that he has the support of his mom, his dad, his fiance, his aunt, his sister, and his brother. He says that his aunt smokes weed and drinks and that both his parents are on opioid pain medications reportedly for chronic pain.  His mom and sister had genetic testing and were put on Trintillex which is working well so Andrew Castillo wonders if Trintillex could help him as well.  Today, his PHQ-9 is a 21 and GAD-7 is a 19. His UDS is positive for buprenorphine, oxycodone, and THC. He is 5"11, weighs 219 pounds, his BP is 157/102, and his pulse is 112.   He would like to start SA IOP tomorrow and meets medical necessity criteria for this level of care as he did when he was seen for his CCA by this therapist on 12/13/22. He says that he now is motivated to do what is necessary to stop using saying that  he felt better during the month he was sober.   Progress Towards Goals: Initial  Suicidal/Homicidal: No SI or HI  Plan: will start SA IOP on 11/22/23.   Diagnosis:   Collaboration of Care: Other N/A  Patient/Guardian was advised Release of Information must be obtained prior to any record release in order to collaborate their care with an outside provider. Patient/Guardian was advised if they have not already done so to contact the registration department to sign all necessary forms in order for [place] to release information regarding their care.   Consent: Patient/Guardian gives verbal consent for treatment and assignment of benefits for services provided during this visit.  Patient/Guardian expressed understanding and agreed to proceed.   Andrew Blazer, MA, LCSW, [facility], LCAS 11/21/2023

## 2023-11-22 ENCOUNTER — Ambulatory Visit (HOSPITAL_COMMUNITY): Admitting: Medical

## 2023-11-22 ENCOUNTER — Encounter (HOSPITAL_COMMUNITY): Payer: Self-pay | Admitting: Medical

## 2023-11-22 VITALS — BP 157/102 | HR 112 | Ht 71.0 in | Wt 219.0 lb

## 2023-11-22 DIAGNOSIS — F1994 Other psychoactive substance use, unspecified with psychoactive substance-induced mood disorder: Secondary | ICD-10-CM

## 2023-11-22 DIAGNOSIS — F1920 Other psychoactive substance dependence, uncomplicated: Secondary | ICD-10-CM

## 2023-11-22 DIAGNOSIS — G40909 Epilepsy, unspecified, not intractable, without status epilepticus: Secondary | ICD-10-CM

## 2023-11-22 DIAGNOSIS — F1220 Cannabis dependence, uncomplicated: Secondary | ICD-10-CM

## 2023-11-22 DIAGNOSIS — K2100 Gastro-esophageal reflux disease with esophagitis, without bleeding: Secondary | ICD-10-CM

## 2023-11-22 DIAGNOSIS — F1420 Cocaine dependence, uncomplicated: Secondary | ICD-10-CM

## 2023-11-22 DIAGNOSIS — F1020 Alcohol dependence, uncomplicated: Secondary | ICD-10-CM

## 2023-11-22 NOTE — Progress Notes (Signed)
 Individual Note  Time: 9:01 am to 9:45 am  Intervention: Active Listening/Motivational Interview/Explore Support groups (12 step meetings)  Goal: Decrease anxiety and depression, Remain abstinent from all substances  Summary:  Emir presents today prior to beginning CD-IOP.  He reports he is feeling nauseated and goes to the restroom. Upon his return, he reports he had to vomit. 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  He reports he is taking Zofran, however he reports it is not controlling his nausea or vomiting.    Clemens communicates the goals he wanted to work on.  He says he is very motivated toward his sobriety and will do whatever he had to do to obtain continued sobriety.  He agrees to start CDIOP on Monday, 11-25-23.   He reports he is unemployed currently and he spends his time watching T.V. with his 64 year old daughter and finance, cooking and he enjoys golf.  Jessi shares that his nausea is getting increasingly worse and he needs to leave. He says he may go to the hospital to see if they can give him something else for his nausea.  S/HI:  denies  Plan: Begin CDIOP on Monday 09-27-23
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
## 2023-11-24 NOTE — ED Provider Notes (Signed)
 Behavioral Health Urgent Care Medical Screening Exam  Patient Name: Andrew Castillo MRN: [id number] Date of Evaluation: 11-20-23 Chief Complaint:   Diagnosis:  Final diagnoses:  Polysubstance use disorder    History of Present illness: Andrew Castillo is a 28 y.o. male. He presents voluntarily to [facility] seeking inpatient psychiatric admission for hx of alcohol use disorder, cannabis use disorder & cocaine use disorder. Says has not used in a while. Currently denies any substance withdrawal symptoms. He reports, this is my first time in this facility. I have not been hospitalized in any psychiatric hospital in the past. But I'm addicted to alcohol, weed & cocaine. I need inpatient admission to help me quit drinking & using. I need direction in my life. I stared using substances 10 years ago for weed, 7 years ago for cocaine & 6 years ago for alcohol.  I'm engaged & has a daughter. I need help.  Flowsheet Row ED from 11/20/2023 in [facility] ED from 11/19/2023 in [facility] Emergency Department at [facility] ED from 05/31/2023 in [facility] Emergency Department at [facility]  C-SSRS RISK CATEGORY No Risk No Risk No Risk      Psychiatric Specialty Exam  Presentation  General Appearance:Casual  Eye Contact:Fleeting  Speech:Clear and Coherent; Normal Rate  Speech Volume:Normal  Handedness:Right   Mood and Affect  Mood: Anxious; Depressed  Affect: Congruent   Thought Process  Thought Processes: Coherent  Descriptions of Associations:Intact  Orientation:Full (Time, Place and Person)  Thought Content:Logical  Diagnosis of Schizophrenia or Schizoaffective disorder in past: No   Hallucinations:None  Ideas of Reference:None  Suicidal Thoughts:No  Homicidal Thoughts:No   Sensorium  Memory: Immediate Good; Recent Good; Remote Good  Judgment: Fair  Insight: Fair   Art therapist   Concentration: Fair  Attention Span: Fair  Recall: Good  Fund of Knowledge: Good  Language: Good   Psychomotor Activity  Psychomotor Activity: Normal   Assets  Assets: Communication Skills; Housing; Intimacy; Leisure Time; Resilience; Social Support; Financial Resources/Insurance   Sleep  Sleep: Fair  Number of hours: No data recorded  Physical Exam: Physical Exam Cardiovascular:     Pulses: Normal pulses.  Pulmonary:     Effort: Pulmonary effort is normal.  Genitourinary:    Comments: Deferred Musculoskeletal:        General: Normal range of motion.     Cervical back: Normal range of motion.  Skin:    General: Skin is warm and dry.  Neurological:     General: No focal deficit present.     Mental Status: He is alert and oriented to person, place, and time. Mental status is at baseline.    Review of Systems  Constitutional:  Negative for chills, diaphoresis and fever.  HENT:  Negative for congestion and sore throat.   Respiratory:  Negative for cough, shortness of breath and wheezing.   Cardiovascular:  Negative for chest pain and palpitations.  Gastrointestinal:  Negative for abdominal pain, constipation, diarrhea, heartburn, nausea and vomiting.  Musculoskeletal:  Negative for joint pain and myalgias.  Neurological:  Negative for dizziness, tingling, tremors, sensory change, speech change, focal weakness, seizures, loss of consciousness, weakness and headaches.  Endo/Heme/Allergies:        Allergies: PCN, Flexeril.  Psychiatric/Behavioral:  Positive for substance abuse (Hx of alcohol. cociane). Negative for depression, hallucinations, memory loss and suicidal ideas. The patient is not nervous/anxious and does not have insomnia.    Blood pressure (!) 145/79, pulse  68, resp. rate 20, SpO2 100%. There is no height or weight on file to calculate BMI.  Musculoskeletal: Strength & Muscle Tone: within normal limits Gait & Station: normal Patient leans:  N/A   [facility] MSE Discharge Disposition for Follow up and Recommendations: Based on my evaluation the patient does not appear to have an emergency medical condition and can be discharged with resources and follow up care in outpatient services for Partial Hospitalization Program and Substance Abuse Intensive Outpatient Program  Armandina Stammer, NP, pmhnp, fnp-bc. 11/20/23, 3:58 PM

## 2023-11-24 NOTE — Discharge Instructions (Signed)
 Patient is referred to the [facility].

## 2023-11-25 ENCOUNTER — Other Ambulatory Visit (HOSPITAL_COMMUNITY): Payer: Self-pay | Admitting: Medical

## 2023-11-25 ENCOUNTER — Telehealth (HOSPITAL_COMMUNITY): Payer: Self-pay | Admitting: Licensed Clinical Social Worker

## 2023-11-25 ENCOUNTER — Encounter: Payer: Self-pay | Admitting: Emergency Medicine

## 2023-11-25 ENCOUNTER — Other Ambulatory Visit: Payer: Self-pay

## 2023-11-25 ENCOUNTER — Ambulatory Visit (HOSPITAL_COMMUNITY): Admitting: Medical

## 2023-11-25 ENCOUNTER — Ambulatory Visit: Admission: EM | Admit: 2023-11-25 | Discharge: 2023-11-25 | Disposition: A | Discharge status: Home / Self Care

## 2023-11-25 VITALS — BP 167/115 | HR 86

## 2023-11-25 DIAGNOSIS — F410 Panic disorder [episodic paroxysmal anxiety] without agoraphobia: Secondary | ICD-10-CM

## 2023-11-25 DIAGNOSIS — F1420 Cocaine dependence, uncomplicated: Secondary | ICD-10-CM

## 2023-11-25 DIAGNOSIS — R030 Elevated blood-pressure reading, without diagnosis of hypertension: Secondary | ICD-10-CM | POA: Diagnosis not present

## 2023-11-25 DIAGNOSIS — K2100 Gastro-esophageal reflux disease with esophagitis, without bleeding: Secondary | ICD-10-CM

## 2023-11-25 DIAGNOSIS — F1220 Cannabis dependence, uncomplicated: Secondary | ICD-10-CM

## 2023-11-25 DIAGNOSIS — F1920 Other psychoactive substance dependence, uncomplicated: Secondary | ICD-10-CM | POA: Diagnosis not present

## 2023-11-25 DIAGNOSIS — F1020 Alcohol dependence, uncomplicated: Secondary | ICD-10-CM

## 2023-11-25 DIAGNOSIS — M5126 Other intervertebral disc displacement, lumbar region: Secondary | ICD-10-CM | POA: Insufficient documentation

## 2023-11-25 DIAGNOSIS — F17200 Nicotine dependence, unspecified, uncomplicated: Secondary | ICD-10-CM | POA: Diagnosis not present

## 2023-11-25 DIAGNOSIS — F1994 Other psychoactive substance use, unspecified with psychoactive substance-induced mood disorder: Secondary | ICD-10-CM

## 2023-11-25 NOTE — Progress Notes (Signed)
 Patient ID: Andrew Castillo, male   DOB: Feb 02, 1996, 28 y.o.   MRN: [id number]   Pt was scheduled for Provider intake last Friday but had to leave due to N&V/GI illness. Review of his records revealed  4yer hx of same with basically negative CTs and EGD. Pt also had ? Seizure in ED 2024 that Swa a panic atack and placed on Clobazpam BY NEUROLOGY UDS per Counselor + for Oxycontin and BuprenorphineLive with parents who have 'CHRONIC PAIN" AN PROVIDE HIM WITH OPIATES oPIATES ARE NOT NOTED AS PART OF HIS POLYSUBSTANCE USE. Vitals/BP abnormal (see today's recordings DBP 115 He was sent to Urgent Care by nurse.

## 2023-11-25 NOTE — ED Provider Notes (Signed)
 [name]    CSN: [id number] Arrival date & time: 11/25/23  1119      History   Chief Complaint Chief Complaint  Patient presents with   Hypertension    HPI Andrew Castillo is a 28 y.o. male.   29 year old male pt, Andrew Castillo, presents to urgent care for concern of elevated blood pressure, pt is currently doing outpt detox from Cocaine and Alcohol. Pt denies any chest pain,  SOB or palpitations.  Pt smokes cigarettes, has PCP  The history is provided by the patient. No language interpreter was used.    Past Medical History:  Diagnosis Date   Anxiety and depression    Childhood asthma    Cocaine abuse (HCC)    Environmental allergies    GERD (gastroesophageal reflux disease)    Headache    migraines   Panic attacks    Polysubstance abuse (HCC)    Seasonal allergic conjunctivitis    Seizures (HCC)    caused by flexiril 01/2022    Patient Active Problem List   Diagnosis Date Noted   Displacement of lumbar intervertebral disc 11/25/2023   Elevated blood pressure reading 11/25/2023   Internal derangement of left knee 04/24/2023   Acute lateral meniscus tear of left knee 01/07/2023   Marijuana abuse 12/18/2022   Panic attacks 12/07/2022   Hx of seizure disorder 12/03/2022   Cocaine abuse (HCC) 12/03/2022   Seizure disorder (HCC) 05/24/2022   Smoker 06/24/2019   Seizure-like activity (HCC) 04/30/2016   Anxiety and depression 11/21/2015   Gastroesophageal reflux disease with esophagitis 07/15/2015   Inattention 07/15/2015   Allergic rhinitis 11/14/2006   Asthma 11/14/2006   ECZEMA, ATOPIC DERMATITIS 11/14/2006    Past Surgical History:  Procedure Laterality Date   ESOPHAGOGASTRODUODENOSCOPY     Left Knee Menuiscus     "several surguries on same knee"   NM ESOPHAGEAL REFLUX     Right Shoulder Surgery     TONSILLECTOMY     adenoids   UPPER GI ENDOSCOPY         Home Medications    Prior to Admission medications   Medication Sig  Start Date End Date Taking? Authorizing Provider  cloBAZam (ONFI) 10 MG tablet Take 1 tablet (10 mg total) by mouth 2 (two) times daily. 11/30/22 05/29/23  Windell Norfolk, MD  famotidine (PEPCID) 40 MG tablet Take 1 tablet (40 mg total) by mouth at bedtime. 11/06/23   Iva Boop, MD  omeprazole (PRILOSEC) 40 MG capsule Take 1 capsule (40 mg total) by mouth 2 (two) times daily before a meal. 30 mins before breakfast and supper 11/06/23   Iva Boop, MD  ondansetron (ZOFRAN) 8 MG tablet Take 1 tablet (8 mg total) by mouth every 8 (eight) hours as needed for nausea or vomiting. 11/11/23   Iva Boop, MD    Family History Family History  Problem Relation Age of Onset   Fibromyalgia Mother    Thyroid disease Mother        Living   Lupus Mother    Anxiety disorder Mother    Clotting disorder Mother    Crohn's disease Mother    Hypertension Father    Hyperlipidemia Father        Living   Liver disease Father    Anxiety disorder Sister    ADD / ADHD Brother    Seizures Brother    Anxiety disorder Maternal Aunt    Depression Maternal Aunt  Lupus Maternal Aunt    Diabetes Paternal Aunt    Lupus Paternal Aunt    Fibromyalgia Paternal Aunt    Drug abuse Paternal Uncle    Diabetes Maternal Grandmother    Hypertension Maternal Grandmother    Liver disease Maternal Grandmother    Bone cancer Paternal Grandmother    Heart disease Paternal Grandfather    Diabetes Paternal Grandfather    Migraines Other        Various   Colon cancer Neg Hx    Esophageal cancer Neg Hx    Rectal cancer Neg Hx    Stomach cancer Neg Hx     Social History Social History   Tobacco Use   Smoking status: Every Day    Current packs/day: 0.50    Average packs/day: 0.5 packs/day for 2.0 years (1.0 ttl pk-yrs)    Types: Cigarettes   Smokeless tobacco: Never   Tobacco comments:    1 pack every 2-3 days  Vaping Use   Vaping status: Never Used  Substance Use Topics   Alcohol use: Not Currently    Drug use: Yes    Types: Marijuana    Comment: Daily-     Allergies   Penicillins and Flexeril [cyclobenzaprine]   Review of Systems Review of Systems  Constitutional:  Negative for fever.  Respiratory:  Negative for shortness of breath.   Cardiovascular:  Negative for chest pain and palpitations.  All other systems reviewed and are negative.    Physical Exam Triage Vital Signs ED Triage Vitals [11/25/23 1204]  Encounter Vitals Group     BP (!) 158/102     Systolic BP Percentile      Diastolic BP Percentile      Pulse Rate 98     Resp 18     Temp 98.6 F (37 C)     Temp Source Oral     SpO2 96 %     Weight      Height      Head Circumference      Peak Flow      Pain Score 0     Pain Loc      Pain Education      Exclude from Growth Chart    No data found.  Updated Vital Signs BP (!) 158/102 (BP Location: Left Arm)   Pulse 98   Temp 98.6 F (37 C) (Oral)   Resp 18   SpO2 96%   Visual Acuity Right Eye Distance:   Left Eye Distance:   Bilateral Distance:    Right Eye Near:   Left Eye Near:    Bilateral Near:     Physical Exam Vitals and nursing note reviewed.  Constitutional:      General: He is not in acute distress.    Appearance: He is well-developed and well-groomed.  HENT:     Head: Normocephalic and atraumatic.  Eyes:     Conjunctiva/sclera: Conjunctivae normal.  Cardiovascular:     Rate and Rhythm: Normal rate and regular rhythm.     Heart sounds: Normal heart sounds. No murmur heard. Pulmonary:     Effort: Pulmonary effort is normal. No respiratory distress.     Breath sounds: Normal breath sounds and air entry.  Abdominal:     Palpations: Abdomen is soft.     Tenderness: There is no abdominal tenderness.  Musculoskeletal:        General: No swelling.     Cervical back: Neck supple.  Skin:  General: Skin is warm and dry.     Capillary Refill: Capillary refill takes less than 2 seconds.  Neurological:     General: No focal  deficit present.     Mental Status: He is alert and oriented to person, place, and time.     GCS: GCS eye subscore is 4. GCS verbal subscore is 5. GCS motor subscore is 6.  Psychiatric:        Attention and Perception: Attention normal.        Mood and Affect: Mood normal.        Speech: Speech normal.        Behavior: Behavior normal. Behavior is cooperative.      UC Treatments / Results  Labs (all labs ordered are listed, but only abnormal results are displayed) Labs Reviewed - No data to display  EKG   Radiology No results found.  Procedures Procedures (including critical care time)  Medications Ordered in UC Medications - No data to display  Initial Impression / Assessment and Plan / UC Course  I have reviewed the triage vital signs and the nursing notes.  Pertinent labs & imaging results that were available during my care of the patient were reviewed by me and considered in my medical decision making (see chart for details).    Discussed exam findings and plan of care with patient, strict go to ER precautions given.   Patient verbalized understanding to this provider.  Ddx: Elevated blood pressure, smoker, polysubstance abuse,withdrawal Final Clinical Impressions(s) / UC Diagnoses   Final diagnoses:  Elevated blood pressure reading  Smoker     Discharge Instructions      Please follow up with PCP for further management of elevated blood pressure, we are unable to start you on BP meds. Continue outpt detox counseling/management Stop smoking, cut back gradually this should also help with blood pressure readings If you develop chest pain,shortness of breath, palpitations, vision changes, worst headache of life, etc  go immediately to ER or call 9-1-1 for further evaluation.        ED Prescriptions   None    PDMP not reviewed this encounter.   Clancy Gourd, NP 11/25/23 1328

## 2023-11-25 NOTE — Discharge Instructions (Signed)
 Please follow up with PCP for further management of elevated blood pressure, we are unable to start you on BP meds. Continue outpt detox counseling/management Stop smoking, cut back gradually this should also help with blood pressure readings If you develop chest pain,shortness of breath, palpitations, vision changes, worst headache of life, etc  go immediately to ER or call 9-1-1 for further evaluation.

## 2023-11-25 NOTE — ED Triage Notes (Signed)
 Pt here for hypertension; pt is currently doing outpt detox from cocaine and ETOH and has noted increased BP with daily checks

## 2023-11-25 NOTE — Telephone Encounter (Signed)
 The therapist attempts to reach [facility] leaving a HIPAA-compliant voicemail. Konstantin was advised to go to an "Urgent Care" about his BP and getting started on medication for it; however, ended up going to the ER instead so received no medication and was referred to his PCP for this.  [name], MA, LCSW, [facility], LCAS 11/25/2023

## 2023-11-25 NOTE — Progress Notes (Signed)
 Daily Group Progress Note   Program: CD IOP     Group Time: 9 a.m. to 11 a.m      Type of Therapy: Process and Psychoeducational    Topic: The therapist checks in with group members, assesses for SI/HI/psychosis and overall level of functioning. The therapist inquires about sobriety date and number of community support meetings attended since last session.    The therapist facilitates group discussions on the topics of addiction as a disease that impacts the entire family, the reason it is imperative for persons in early recovery to avoid triggers and focus on being smart versus strong, assertiveness and self-care, and the fact that people in early recovery can make poor decisions even when not using substances due to impairment in their prefrontal cortexes.   Summary: Ary presents rating both his depression and anxiety as a 6.  He reports no longer feeling nauseated saying that he is feeling much better both physically and emotionally.  During the initial part of the meeting, he asks about where to find good 12-step meetings and is invited to attend some meetings with some of the other group members.  He appropriately participates in much of today's group discussion; however, after having his vitals taken again at the request of the PA-C, he returns to group saying that he has been instructed to go to the urgent care concerning his elevated blood pressure so must leave group again early today.  Other group members inform him that this is understandable and that he must take care of his health.  Progress Towards Goals: Kemper reports no change in his sobriety date.     UDS collected: Yes  Results: No   AA/NA attended?:  No   Sponsor?:  No   Alene Mires, [place], [place], [facility], LCAS 11/25/2023

## 2023-11-27 ENCOUNTER — Encounter: Payer: Self-pay | Admitting: Family Medicine

## 2023-11-27 ENCOUNTER — Ambulatory Visit (INDEPENDENT_AMBULATORY_CARE_PROVIDER_SITE_OTHER): Admitting: Family Medicine

## 2023-11-27 ENCOUNTER — Ambulatory Visit (INDEPENDENT_AMBULATORY_CARE_PROVIDER_SITE_OTHER): Admitting: Licensed Clinical Social Worker

## 2023-11-27 ENCOUNTER — Other Ambulatory Visit (HOSPITAL_COMMUNITY): Payer: Self-pay | Admitting: Medical

## 2023-11-27 VITALS — BP 142/98 | HR 100 | Temp 98.0°F | Ht 71.0 in | Wt 230.0 lb

## 2023-11-27 DIAGNOSIS — F1920 Other psychoactive substance dependence, uncomplicated: Secondary | ICD-10-CM | POA: Diagnosis not present

## 2023-11-27 DIAGNOSIS — Z9289 Personal history of other medical treatment: Secondary | ICD-10-CM

## 2023-11-27 DIAGNOSIS — I159 Secondary hypertension, unspecified: Secondary | ICD-10-CM

## 2023-11-27 DIAGNOSIS — F1994 Other psychoactive substance use, unspecified with psychoactive substance-induced mood disorder: Secondary | ICD-10-CM

## 2023-11-27 MED ORDER — CLONIDINE 0.1 MG/24HR TD PTWK: 0.1000 mg | MEDICATED_PATCH | TRANSDERMAL | 0 refills | Status: DC

## 2023-11-27 NOTE — Patient Instructions (Signed)
 Monitor your blood pressure at home     Clonidine Patches What is this medication? CLONIDINE (KLOE ni deen) treats high blood pressure. It works by relaxing blood vessels, which decreases blood pressure and the amount of work the heart has to do. This medicine may be used for other purposes; ask your health care provider or pharmacist if you have questions. COMMON BRAND NAME(S): Catapres TTS-2, Catapres-TTS What should I tell my care team before I take this medication? They need to know if you have any of these conditions: Kidney disease An unusual or allergic reaction to clonidine, other medications, foods, dyes, or preservatives Pregnant or trying to get pregnant Breast-feeding How should I use this medication? This medication is for external use only. Use it as directed on the prescription label. Apply the patch to an area of the upper arm or part of the body that is clean, dry, and hairless. Avoid injured, irritated, calloused, or scarred areas. Use a different site each time to prevent skin irritation. Do not cut or trim the patch. One patch should last for 7 days. Keep using it unless your care team tells you to stop. This medication comes with INSTRUCTIONS FOR USE. Ask your pharmacist for directions on how to use this medication. Read the information carefully. Talk to your pharmacist or care team if you have questions. Talk to your care team about the use of this medication in children. Special care may be needed. Overdosage: If you think you have taken too much of this medicine contact a poison control center or emergency room at once. NOTE: This medicine is only for you. Do not share this medicine with others. What if I miss a dose? Replace each patch on the same day of each week, or if the patch falls off. If you do forget to change the patch for two or three days, check with your care team. What may interact with this medication? Do not take this medication with any of the  following: MAOIs, such as Carbex, Eldepryl, Marplan, Nardil, and Parnate This medication may also interact with the following: Barbiturate medications for inducing sleep or treating seizures, such as phenobarbital Certain medications for blood pressure, heart disease, or irregular heart beat Certain medications for mental health conditions Prescription pain medications This list may not describe all possible interactions. Give your health care provider a list of all the medicines, herbs, non-prescription drugs, or dietary supplements you use. Also tell them if you smoke, drink alcohol, or use illegal drugs. Some items may interact with your medicine. What should I watch for while using this medication? Visit your care team for regular checks on your progress. Check your heart rate and blood pressure as directed. Know what your heart rate and blood pressure should be, and when to contact your care team. You can shower or bathe with the skin patch in position. If the patch gets loose, cover it with the extra adhesive overlay provided. This medication may affect your coordination, reaction time, or judgment. Do not drive or operate machinery until you know how this medication affects you. Sit up or stand slowly to reduce the risk of dizzy or fainting spells. Drinking alcohol with this medication can increase the risk of these side effects. Your mouth may get dry. Chewing sugarless gum or sucking hard candy and drinking plenty of water may help. Contact your care team if the problem doesn't go away or is severe. Do not treat yourself for coughs, colds, or pain while you are  taking this medication without asking your care team for advice. Some medications may increase your blood pressure. If you are going to have surgery, tell your care team that you are using this medication. If you are going to have a magnetic resonance imaging (MRI) procedure, tell your MRI technician if you have this patch on your  body. It must be removed before a MRI. What side effects may I notice from receiving this medication? Side effects that you should report to your care team as soon as possible: Allergic reactions--skin rash, itching, hives, swelling of the face, lips, tongue, or throat Low blood pressure--dizziness, feeling faint or lightheaded, blurry vision Slow heartbeat--dizziness, feeling faint or lightheaded, confusion, trouble breathing, unusual weakness or fatigue Side effects that usually do not require medical attention (report to your care team if they continue or are bothersome): Constipation Dizziness Drowsiness Dry eyes Dry mouth Fatigue This list may not describe all possible side effects. Call your doctor for medical advice about side effects. You may report side effects to FDA at 1-800-FDA-1088. Where should I keep my medication? Keep out of the reach of children and pets. Store at room temperature between 20 and 25 degrees C (68 to 77 degrees F). Throw away any unused medication after the expiration date. NOTE: This sheet is a summary. It may not cover all possible information. If you have questions about this medicine, talk to your doctor, pharmacist, or health care provider.  2024 Elsevier/Gold Standard (2022-04-04 00:00:00)

## 2023-11-27 NOTE — Progress Notes (Signed)
 Subjective:     Patient ID: Andrew Castillo, male    DOB: [birth year], 28 y.o.   MRN: [id number]  Chief Complaint  Patient presents with   Hypertension    Going through outpatient rn and in order to get medication they want him to get BP under control, possibly medication    Hypertension Associated symptoms include headaches. Pertinent negatives include no chest pain, malaise/fatigue, palpitations or shortness of breath.     History of Present Illness          Here with concerns r/t elevated blood pressure. For the past 2 weeks, his BP has been elevated in the 160s/105-116   Intensive outpatient treatment for detox. Today was his 3rd day of group. He also does independent therapy. States he is attending AA meetings   States he was using cocaine, alcohol and marijuana prior to 9 days ago.   States behavioral health specialist will not start him on psychotropic medications until his BP is better controlled.      11/21/2023   11:28 AM 12/13/2022    9:09 AM 12/07/2022    9:07 AM 05/24/2022    9:44 AM 09/25/2018   11:00 AM  Depression screen PHQ 2/9  Decreased Interest 2 3 2  0 0  Down, Depressed, Hopeless 3 2 3  0 0  PHQ - 2 Score 5 5 5  0 0  Altered sleeping 3 3 3   0  Tired, decreased energy 3 3 3   0  Change in appetite 3 3 3   0  Feeling bad or failure about yourself  3 2 3   0  Trouble concentrating 2 1 2   0  Moving slowly or fidgety/restless 2 2 1   0  Suicidal thoughts 0 0 1  0  PHQ-9 Score 21 19 21   0  Difficult doing work/chores   Extremely dIfficult  Not difficult at all       Health Maintenance Due  Topic Date Due   Pneumococcal Vaccine 72-67 Years old (1 of 2 - PCV) Never done   Hepatitis C Screening  Never done   DTaP/Tdap/Td (6 - Td or Tdap) 04/09/2018    Past Medical History:  Diagnosis Date   Anxiety and depression    Childhood asthma    Cocaine abuse (HCC)    Environmental allergies    GERD (gastroesophageal reflux disease)    Headache    migraines    Panic attacks    Polysubstance abuse (HCC)    Seasonal allergic conjunctivitis    Seizures (HCC)    caused by flexiril 01/2022    Past Surgical History:  Procedure Laterality Date   ESOPHAGOGASTRODUODENOSCOPY     Left Knee Menuiscus     "several surguries on same knee"   NM ESOPHAGEAL REFLUX     Right Shoulder Surgery     TONSILLECTOMY     adenoids   UPPER GI ENDOSCOPY      Family History  Problem Relation Age of Onset   Fibromyalgia Mother    Thyroid disease Mother        Living   Lupus Mother    Anxiety disorder Mother    Clotting disorder Mother    Crohn's disease Mother    Hypertension Father    Hyperlipidemia Father        Living   Liver disease Father    Anxiety disorder Sister    ADD / ADHD Brother    Seizures Brother    Anxiety disorder Maternal Aunt  Depression Maternal Aunt    Lupus Maternal Aunt    Diabetes Paternal Aunt    Lupus Paternal Aunt    Fibromyalgia Paternal Aunt    Drug abuse Paternal Uncle    Diabetes Maternal Grandmother    Hypertension Maternal Grandmother    Liver disease Maternal Grandmother    Bone cancer Paternal Grandmother    Heart disease Paternal Grandfather    Diabetes Paternal Grandfather    Migraines Other        Various   Colon cancer Neg Hx    Esophageal cancer Neg Hx    Rectal cancer Neg Hx    Stomach cancer Neg Hx     Social History   Socioeconomic History   Marital status: Single    Spouse name: Not on file   Number of children: 1   Years of education: 12   Highest education level: Not on file  Occupational History   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, [employer]  Tobacco Use   Smoking status: Every Day    Current packs/day: 0.50    Average packs/day: 0.5 packs/day for 2.0 years (1.0 ttl pk-yrs)    Types: Cigarettes   Smokeless tobacco: Never   Tobacco comments:    1 pack every 2-3 days  Vaping Use   Vaping status: Never Used  Substance and Sexual Activity   Alcohol use: Not Currently   Drug  use: Yes    Types: Marijuana    Comment: Daily-   Sexual activity: Yes  Other Topics Concern   Not on file  Social History Narrative   Single-engaged, 1 child lives with fianc   Marijuana user Cigarette smoker   no current alcohol no other drugs   He is an Designer, television/film set   Social Drivers of Corporate investment banker Strain: Not on file  Food Insecurity: No Food Insecurity (12/03/2022)   Hunger Vital Sign    Worried About Running Out of Food in the Last Year: Never true    Ran Out of Food in the Last Year: Never true  Transportation Needs: No Transportation Needs (12/03/2022)   PRAPARE - Administrator, Civil Service (Medical): No    Lack of Transportation (Non-Medical): No  Physical Activity: Not on file  Stress: Not on file  Social Connections: Not on file  Intimate Partner Violence: Not At Risk (12/03/2022)   Humiliation, Afraid, Rape, and Kick questionnaire    Fear of Current or Ex-Partner: No    Emotionally Abused: No    Physically Abused: No    Sexually Abused: No    Outpatient Medications Prior to Visit  Medication Sig Dispense Refill   famotidine (PEPCID) 40 MG tablet Take 1 tablet (40 mg total) by mouth at bedtime. 90 tablet 3   omeprazole (PRILOSEC) 40 MG capsule Take 1 capsule (40 mg total) by mouth 2 (two) times daily before a meal. 30 mins before breakfast and supper 180 capsule 3   ondansetron (ZOFRAN) 8 MG tablet Take 1 tablet (8 mg total) by mouth every 8 (eight) hours as needed for nausea or vomiting. 30 tablet 2   No facility-administered medications prior to visit.    Allergies  Allergen Reactions   Penicillins Hives    Did it involve swelling of the face/tongue/throat, SOB, or low BP? Unknown Did it involve sudden or severe rash/hives, skin peeling, or any reaction on the inside of your mouth or nose? Unknown Did you need to seek medical attention at a hospital  or doctor's office? Unknown When did it last happen?   Pt doesn't remember  he said he was a child    If all above answers are "NO", may proceed with cephalosporin use.    Flexeril [Cyclobenzaprine]     seizures    Review of Systems  Constitutional:  Negative for chills, fever and malaise/fatigue.  Respiratory:  Negative for shortness of breath.   Cardiovascular:  Negative for chest pain, palpitations and leg swelling.  Gastrointestinal:  Negative for abdominal pain, constipation, diarrhea, nausea and vomiting.  Genitourinary:  Negative for dysuria, frequency and urgency.  Neurological:  Positive for headaches. Negative for dizziness and focal weakness.  Psychiatric/Behavioral:  Positive for depression and substance abuse. Negative for suicidal ideas. The patient is nervous/anxious.        9 days sober        Objective:    Physical Exam Constitutional:      General: He is not in acute distress.    Appearance: He is not ill-appearing.  HENT:     Mouth/Throat:     Mouth: Mucous membranes are moist.  Eyes:     Extraocular Movements: Extraocular movements intact.     Conjunctiva/sclera: Conjunctivae normal.  Cardiovascular:     Rate and Rhythm: Normal rate and regular rhythm.  Pulmonary:     Effort: Pulmonary effort is normal.     Breath sounds: Normal breath sounds.  Musculoskeletal:     Cervical back: Normal range of motion and neck supple.  Skin:    General: Skin is warm and dry.  Neurological:     General: No focal deficit present.     Mental Status: He is alert and oriented to person, place, and time.     Motor: No weakness or tremor.     Coordination: Coordination normal.     Gait: Gait normal.  Psychiatric:        Attention and Perception: Attention normal.        Mood and Affect: Mood and affect normal.        Speech: Speech normal.        Behavior: Behavior normal.        Thought Content: Thought content normal. Thought content is not paranoid. Thought content does not include homicidal or suicidal ideation.      BP (!) 142/98 (BP  Location: Left Arm, Patient Position: Sitting)   Pulse 100   Temp 98 F (36.7 C) (Temporal)   Ht 5\' 11"  (1.803 m)   Wt 230 lb (104.3 kg)   SpO2 98%   BMI 32.08 kg/m  Wt Readings from Last 3 Encounters:  11/27/23 230 lb (104.3 kg)  11/22/23 219 lb (99.3 kg)  11/06/23 236 lb (107 kg)       Assessment & Plan:   Problem List Items Addressed This Visit   None Visit Diagnoses       Secondary hypertension    -  Primary   Relevant Medications   cloNIDine (CATAPRES - DOSED IN MG/24 HR) 0.1 mg/24hr patch     Status post alcohol detoxification          Reviewed notes and results from ED as well as most recent note from [facility]. Discussed case with Dr. Yetta Barre.  Start on clonidine patches.  Monitor BP at home.  DASH diet handout given.  He will continue intensive outpatient therapy.  Follow up here in 2 weeks. Bring in BP readings.   I am having Andrew Castillo start on cloNIDine.  I am also having him maintain his omeprazole, famotidine, and ondansetron.  Meds ordered this encounter  Medications   cloNIDine (CATAPRES - DOSED IN MG/24 HR) 0.1 mg/24hr patch    Sig: Place 1 patch (0.1 mg total) onto the skin once a week.    Dispense:  2 patch    Refill:  0    Supervising Provider:   Hillard Danker A [[id number]]

## 2023-11-27 NOTE — Progress Notes (Signed)
 Daily Group Progress Note   Program: [facility] IOP     Group Time: 9 a.m. to 12 p.m.      Type of Therapy: Process and Psychoeducational    Topic: The therapists check in with group members, assess for SI/HI/psychosis and overall level of functioning. The therapists inquire about sobriety date and number of community support meetings attended since last session.    The therapists discuss the need to develop coping skills as opposed to only using distraction as a means of coping with difficult emotions and life's challenges. The therapists discuss the benefits of meditation providing information on how to do guided meditations as well as self-directed meditation. The therapists primarily focus on the topic of switching additions and the reason that all persons with the disease of addiction should treat all controlled substances as a threat to their sobriety. The therapists show a video by Orlin Hilding on the Psychology of Addiction shattering the myth of the existence of hard versus soft drugs.   Summary: Andrew Castillo presents today rating his depression as a 6 and his anxiety as an 8. He has an appointment this afternoon to see his PCP concerning his blood pressure.  He says that he has about a week-and-a-half of sobriety and has not attended a meeting but is going to attend one tomorrow. He says that his mood is "optimistic" and "anxious."  Andrew Castillo says that he has had some cravings but has managed them by calling his mom or his aunt. He says that he is fine until he comes home and then feels depressed. He says that his fiance does not want him to go anywhere fearing he will go out and use so he feels "trapped." The therapist talks to him about asking her to attend a conjoint session which he says he is going to do.  He says that he will not be in group on Friday as he has to go to his aunt's funeral in [place]. He says that a friend is going to drive him; however, the friend smokes pot and his car  smells like pot. After receiving feedback from the therapists and other group members, he says that he is going to see if he can switch cars with his wife so he can drive her car and avoid this possible trigger after the need to be smart versus strong is explained to him.    Progress Towards Goals: Andrew Castillo reports no change in his sobriety date.     UDS collected: No Results: No   AA/NA attended?:  No  Sponsor?:  No   Alene Mires, MA, [place], [facility], LCAS Remigio Eisenmenger, [place], LCAS 11/27/2023

## 2023-11-29 ENCOUNTER — Ambulatory Visit (HOSPITAL_COMMUNITY)

## 2023-12-02 ENCOUNTER — Telehealth (HOSPITAL_COMMUNITY): Payer: Self-pay | Admitting: Licensed Clinical Social Worker

## 2023-12-02 ENCOUNTER — Ambulatory Visit (INDEPENDENT_AMBULATORY_CARE_PROVIDER_SITE_OTHER): Admitting: Medical

## 2023-12-02 ENCOUNTER — Encounter (HOSPITAL_COMMUNITY): Payer: Self-pay | Admitting: Medical

## 2023-12-02 VITALS — BP 152/94 | HR 106 | Temp 99.0°F | Ht 71.0 in | Wt 230.0 lb

## 2023-12-02 DIAGNOSIS — Z659 Problem related to unspecified psychosocial circumstances: Secondary | ICD-10-CM

## 2023-12-02 DIAGNOSIS — F410 Panic disorder [episodic paroxysmal anxiety] without agoraphobia: Secondary | ICD-10-CM

## 2023-12-02 DIAGNOSIS — I10 Essential (primary) hypertension: Secondary | ICD-10-CM

## 2023-12-02 DIAGNOSIS — F1020 Alcohol dependence, uncomplicated: Secondary | ICD-10-CM

## 2023-12-02 DIAGNOSIS — F4312 Post-traumatic stress disorder, chronic: Secondary | ICD-10-CM

## 2023-12-02 DIAGNOSIS — F1924 Other psychoactive substance dependence with psychoactive substance-induced mood disorder: Secondary | ICD-10-CM

## 2023-12-02 DIAGNOSIS — F12288 Cannabis dependence with other cannabis-induced disorder: Secondary | ICD-10-CM

## 2023-12-02 DIAGNOSIS — K2100 Gastro-esophageal reflux disease with esophagitis, without bleeding: Secondary | ICD-10-CM

## 2023-12-02 DIAGNOSIS — F1420 Cocaine dependence, uncomplicated: Secondary | ICD-10-CM | POA: Diagnosis not present

## 2023-12-02 DIAGNOSIS — F1220 Cannabis dependence, uncomplicated: Secondary | ICD-10-CM

## 2023-12-02 DIAGNOSIS — F1920 Other psychoactive substance dependence, uncomplicated: Secondary | ICD-10-CM | POA: Diagnosis not present

## 2023-12-02 DIAGNOSIS — Z6372 Alcoholism and drug addiction in family: Secondary | ICD-10-CM

## 2023-12-02 DIAGNOSIS — F1994 Other psychoactive substance use, unspecified with psychoactive substance-induced mood disorder: Secondary | ICD-10-CM

## 2023-12-02 DIAGNOSIS — J302 Other seasonal allergic rhinitis: Secondary | ICD-10-CM

## 2023-12-02 MED ORDER — TRAZODONE HCL 50 MG PO TABS: 50.0000 mg | ORAL_TABLET | Freq: Every evening | ORAL | 0 refills | Status: DC | PRN

## 2023-12-02 MED ORDER — BACLOFEN 10 MG PO TABS: 10.0000 mg | ORAL_TABLET | Freq: Three times a day (TID) | ORAL | 1 refills | Status: DC

## 2023-12-02 NOTE — Telephone Encounter (Signed)
 The therapist calls Peretz confirming his identity via two identifiers informing him that he will not be able to bring his daughter back to group due to [facility] policy as it only allows for children being brought to individual sessions if need be.  Rodrigues says that he can possibly get his grandfather to watch her as his mother is recovering from thyroid surgery. He schedules the session with he and his wife for 12/09/23 at 4 p.m.  [name], MA, LCSW, [facility], LCAS 12/02/2023
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
## 2023-12-02 NOTE — Progress Notes (Unsigned)
 Daily Group Progress Note   Program: CD IOP     Group Time: 9 a.m. to 12 p.m.      Type of Therapy: Process and Psychoeducational    Topic: The therapists check in with group members, assess for SI/HI/psychosis and overall level of functioning. The therapists inquire about sobriety date and number of community support meetings attended since last session.  Therapists discuss the importance of meeting with family members to hear how one's addiction affected them when they were younger.  Therapist delineated how this behavior differs from Step work in making amends whenever possible except when it would cause harm.  Discussed "living life on life's terms" as opposed  to people in addiction or early recovery wanting to bend the rules to fullfill their wants an perceived mood.  Therapist discuss what is involved with being able to come to acceptance in their lives.    Summary: Andrew Castillo presents today rating his depression as a 2 and his anxiety as an 5. Andrew Castillo reports the same sobriety date. He says he has attended a meeting and plans to attend another meeting today.  Andrew Castillo says how  going to this meeting helped him to see a new perpecptive on how his life and addiction. He identifies his emotions as "happy and optimistic".     He says he went to his aunts funeral last Friday and saw family members he has not seen in a while. He says his aunt has transitioned and is no longer suffering.  Andrew Castillo says addiction pervades his family and there were some folks on the 'church grounds" using during the service. He says that he just walked off wherever he needed to be so as to stay away from using.  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     Progress Towards Goals: Andrew Castillo reports no change in his sobriety date.     UDS collected: Yes  Results: No   AA/NA attended?:  No  Sponsor?:  No Andrew Eisenmenger, MS, LMFT, LCAS [address], [place], [place], [facility],  LCAS  12/02/2023

## 2023-12-04 ENCOUNTER — Ambulatory Visit (INDEPENDENT_AMBULATORY_CARE_PROVIDER_SITE_OTHER): Admitting: Licensed Clinical Social Worker

## 2023-12-04 ENCOUNTER — Encounter (HOSPITAL_COMMUNITY): Payer: Self-pay | Admitting: Medical

## 2023-12-04 DIAGNOSIS — F1920 Other psychoactive substance dependence, uncomplicated: Secondary | ICD-10-CM

## 2023-12-04 DIAGNOSIS — F1020 Alcohol dependence, uncomplicated: Secondary | ICD-10-CM

## 2023-12-04 DIAGNOSIS — F1220 Cannabis dependence, uncomplicated: Secondary | ICD-10-CM

## 2023-12-04 DIAGNOSIS — F1420 Cocaine dependence, uncomplicated: Secondary | ICD-10-CM

## 2023-12-04 NOTE — Progress Notes (Signed)
 Daily Group Progress Note   Program: CD IOP     Group Time: 9 a.m. to 11:20 a.m.       Type of Therapy: Process and Psychoeducational    Topic: The therapists check in with group members, assess for SI/HI/psychosis and overall level of functioning. The therapists inquire about sobriety date and number of community support meetings attended since last session.    The therapists respond to several group members' challenge concerning the need to avoid triggers and the need to not rely on willpower alone to not drink or or use drugs. The therapists continue to assist group members in coming to understand addiction as a brain-based disease. The therapists explain that Step One is not just a mental step but involves action in that if one recognizes he or she is powerless over drugs and alcohol, then he or she does not wrestle with triggers. The therapists encourage group members to not trust their brains in early recovery.  The therapists discuss the importance of asking for help while validating that doing so takes courage. The therapists illustrate within IOP examples of addicts helping other addicts. The therapists explain the reason behind service work and indicate that the spiritual work of the later steps involves a lifelong process.   Summary: Andrew Castillo presents today rating his depression as a 4 and his anxiety as a 6. He says that he has the same sobriety date describing his moo as "anxious" and "optimistic." He says that he is back to cooking and cleaning again.  Andrew Castillo talks about feeling "fidgety" not knowing why with the therapists explaining that it is likely related to PAWS which will be explained in more detail at next group. He talks about having cravings. He attended a meeting and plans on attending another one today.   During the discussion in which group members challenge the leaders concerning the need to avoid triggers, Andrew Castillo says something to the effect about the therapist not  being able to tell him what to do as it relates to triggers, etcetera. The therapist validates that Andrew Castillo is correct and that Andrew Castillo can ignore the therapists feedback if he so choose.    Progress Towards Goals: Andrew Castillo reports no change in his sobriety date.     UDS collected: No Results: No   AA/NA attended?:  Yes  Sponsor?:  No   Alene Mires, MA, [place], [facility], LCAS Remigio Eisenmenger, [place], LCAS 12/04/2023

## 2023-12-06 ENCOUNTER — Telehealth (HOSPITAL_COMMUNITY): Payer: Self-pay | Admitting: Licensed Clinical Social Worker

## 2023-12-06 ENCOUNTER — Ambulatory Visit (HOSPITAL_COMMUNITY)

## 2023-12-06 NOTE — Telephone Encounter (Signed)
 The therapist attempts to reach [facility] when he does not show for group today but is unable to leave a HIPAA-compliant voicemail as his voicemail box is full.  [name], MA, LCSW, [facility], LCAS 12/06/2023

## 2023-12-09 ENCOUNTER — Ambulatory Visit (INDEPENDENT_AMBULATORY_CARE_PROVIDER_SITE_OTHER)

## 2023-12-09 DIAGNOSIS — F1920 Other psychoactive substance dependence, uncomplicated: Secondary | ICD-10-CM

## 2023-12-09 NOTE — Progress Notes (Addendum)
 Daily Group Progress Note   Program: CD IOP     Group Time: 9 a.m. to 12 p.m.      Type of Therapy: Process and Psychoeducational    Topic: The therapists check in with group members, assess for SI/HI/psychosis and overall level of functioning. The therapists inquire about sobriety date and number of community support meetings attended since last session.    Therapists present information on Medication Assisted Treatment (MAT): Discussed benefits given relapse rates, particularly regarding Opioid Use.  FACTS presented include the following: MAT reduces overdose risks in half. MAT provides a whole person approach. MAT makes recovery possible, noting 90 percent of pts who use MAT maintain sobriety at a 2 year mark. 90 percent chance of relapse in the first year. MYTH presented include the following: MAT is trading one addiction for another. If you are still using (MAT) you are not really in recovery. There is no proof that MAT is more effective than abstinence. There are dangerous side effects. MAT is just another way for MD's to prescribed medications. MAT is like liquid handcuffs.  Therapists provide psycho-education on Cannabis Use including discussing the following issues: cognitive, physical health and mental health impairments it can cause. Therapists point out the statistic that 3 in 10 cannabis users develop cannabis use disorder. Therapists discuss how persons who use cannabis would benefit from being evaluated for Bipolar Disorder. As cannabis can help stabile mood, as disguise an underlying disorder.  Therapists discuss how cannabis use is associated with an increased risk of Alcohol Use Disorders.  In response to a group member asking how long symptoms could last after ceasing to use substances, therapist discuss Post-Acute Withdrawal Syndrome.  Therapist also discuss and process with the group on viewing recovery as a program of attraction, rather than "chasing" after those who are in  active addiction to convince them to stop using/enter treatment.  Summary: Andrew Castillo presents today rating his depression as a 6 and his anxiety as an 6.    He repots he has the same sobriety date but he has "slacked off" on attending meetings. He does not have a sponsor.  Andrew Castillo identifies his emotions as "frustrated and angry". He says these were his emotions on coming in to IOP this a.m. but now he feels Optimistic. 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  Andrew Castillo shares that it is his and his finance's anniversary. He notes they got into a disagreement. Andrew Castillo reports he was so frustrated, he got into his car and went to the liquor store and sat there for about 15 minutes, gathering his thoughts. He made the decision not to go in and came back home. He says he really wanted to drink.  He and his fiance are scheduled to come in at 4 pm for couples therapy with [name], LCSW, LCMHC, LCAS. Andrew Castillo says he is going to try to make the 5:30 AA meeting today.      Progress Towards Goals: Andrew Castillo reports no change in his sobriety date.     UDS collected: Yes  Results: No   AA/NA attended?:  No  Sponsor?:  No  Andrew Eisenmenger, MS, LMFT, LCAS [address] [place], [place], [facility], LCAS  12/09/2023

## 2023-12-09 NOTE — Progress Notes (Signed)
 Daily Group Progress Note   Program: CD IOP     Family Therapy Time: 4 p.m. to 5:05 p.m.      Type of Therapy: Process and Psychoeducational   The therapist meets with Andrew Castillo and Andrew Castillo. Andrew Castillo does not know much about addiction and the treatment of addiction so the therapist answers her questions about this while making her aware of [place] and making her aware that she can attend open meetings with [name].  [name] says that Andrew mood changes at home as it is where he used to use and part of him feels angry that he cannot use. The therapist discusses the importance of meeting attendance and the importance of getting a Sponsor and the numbers of men with years of recovery to call when struggling. The therapist continues to encourage him to focus on being smart versus strong.  [name], MA, LCSW, [facility], LCAS 12/09/2023

## 2023-12-11 ENCOUNTER — Ambulatory Visit (HOSPITAL_COMMUNITY)

## 2023-12-12 ENCOUNTER — Encounter: Payer: Self-pay | Admitting: Family Medicine

## 2023-12-12 ENCOUNTER — Ambulatory Visit (INDEPENDENT_AMBULATORY_CARE_PROVIDER_SITE_OTHER): Admitting: Family Medicine

## 2023-12-12 VITALS — BP 140/74 | HR 98 | Temp 97.6°F | Ht 71.0 in | Wt 242.0 lb

## 2023-12-12 DIAGNOSIS — I159 Secondary hypertension, unspecified: Secondary | ICD-10-CM | POA: Diagnosis not present

## 2023-12-12 MED ORDER — CLONIDINE 0.2 MG/24HR TD PTWK: 0.2000 mg | MEDICATED_PATCH | TRANSDERMAL | 1 refills | Status: DC

## 2023-12-12 NOTE — Progress Notes (Signed)
 Subjective:     Patient ID: Andrew Castillo, male    DOB: 1996-08-21, 28 y.o.   MRN: [id number]  Chief Complaint  Patient presents with   Follow-up    2 week f/u    HPI   History of Present Illness         He is here for a 2 wk follow up on HTN and starting medication for HTN.   He is taking clonidine 0.1 mg/24 HR patch. Using this for 2 weeks.   States as of this coming Sunday, he will be sober for 4 wks.   Going to AA meetings   BP improving slightly.  Outpatient psych 144/90  Home BP 135/87     Health Maintenance Due  Topic Date Due   Pneumococcal Vaccine 74-73 Years old (1 of 2 - PCV) Never done   Hepatitis C Screening  Never done   DTaP/Tdap/Td (6 - Td or Tdap) 04/09/2018    Past Medical History:  Diagnosis Date   Anxiety and depression    Childhood asthma    Cocaine abuse (HCC)    Environmental allergies    GERD (gastroesophageal reflux disease)    Headache    migraines   Panic attacks    Polysubstance abuse (HCC)    Seasonal allergic conjunctivitis    Seizures (HCC)    caused by flexiril 01/2022    Past Surgical History:  Procedure Laterality Date   ESOPHAGOGASTRODUODENOSCOPY     Left Knee Menuiscus     "several surguries on same knee"   NM ESOPHAGEAL REFLUX     Right Shoulder Surgery     TONSILLECTOMY     adenoids   UPPER GI ENDOSCOPY      Family History  Problem Relation Age of Onset   Fibromyalgia Mother    Thyroid disease Mother        Living   Lupus Mother    Anxiety disorder Mother    Clotting disorder Mother    Crohn's disease Mother    Hypertension Father    Hyperlipidemia Father        Living   Liver disease Father    Anxiety disorder Sister    ADD / ADHD Brother    Seizures Brother    Anxiety disorder Maternal Aunt    Depression Maternal Aunt    Lupus Maternal Aunt    Diabetes Paternal Aunt    Lupus Paternal Aunt    Fibromyalgia Paternal Aunt    Drug abuse Paternal Uncle    Diabetes Maternal Grandmother     Hypertension Maternal Grandmother    Liver disease Maternal Grandmother    Bone cancer Paternal Grandmother    Heart disease Paternal Grandfather    Diabetes Paternal Grandfather    Migraines Other        Various   Colon cancer Neg Hx    Esophageal cancer Neg Hx    Rectal cancer Neg Hx    Stomach cancer Neg Hx     Social History   Socioeconomic History   Marital status: Single    Spouse name: Not on file   Number of children: 1   Years of education: 12   Highest education level: Not on file  Occupational History   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, minerals  Tobacco Use   Smoking status: Every Day    Current packs/day: 0.50    Average packs/day: 0.5 packs/day for 2.0 years (1.0 ttl pk-yrs)    Types: Cigarettes  Smokeless tobacco: Never   Tobacco comments:    1 pack every 2-3 days  Vaping Use   Vaping status: Never Used  Substance and Sexual Activity   Alcohol use: Not Currently   Drug use: Yes    Types: Marijuana    Comment: Daily-   Sexual activity: Yes  Other Topics Concern   Not on file  Social History Narrative   Single-engaged, 1 child lives with fianc   Marijuana user Cigarette smoker   no current alcohol no other drugs   He is an Designer, television/film set   Social Drivers of Corporate investment banker Strain: Not on file  Food Insecurity: No Food Insecurity (12/03/2022)   Hunger Vital Sign    Worried About Running Out of Food in the Last Year: Never true    Ran Out of Food in the Last Year: Never true  Transportation Needs: No Transportation Needs (12/03/2022)   PRAPARE - Administrator, Civil Service (Medical): No    Lack of Transportation (Non-Medical): No  Physical Activity: Not on file  Stress: Not on file  Social Connections: Not on file  Intimate Partner Violence: Not At Risk (12/03/2022)   Humiliation, Afraid, Rape, and Kick questionnaire    Fear of Current or Ex-Partner: No    Emotionally Abused: No    Physically Abused: No     Sexually Abused: No    Outpatient Medications Prior to Visit  Medication Sig Dispense Refill   baclofen (LIORESAL) 10 MG tablet Take 1 tablet (10 mg total) by mouth 3 (three) times daily. 90 tablet 1   famotidine (PEPCID) 40 MG tablet Take 1 tablet (40 mg total) by mouth at bedtime. 90 tablet 3   omeprazole (PRILOSEC) 40 MG capsule Take 1 capsule (40 mg total) by mouth 2 (two) times daily before a meal. 30 mins before breakfast and supper 180 capsule 3   ondansetron (ZOFRAN) 8 MG tablet Take 1 tablet (8 mg total) by mouth every 8 (eight) hours as needed for nausea or vomiting. 30 tablet 2   traZODone (DESYREL) 50 MG tablet Take 1 tablet (50 mg total) by mouth at bedtime as needed and may repeat dose one time if needed for sleep. 60 tablet 0   cloNIDine (CATAPRES - DOSED IN MG/24 HR) 0.1 mg/24hr patch Place 1 patch (0.1 mg total) onto the skin once a week. 2 patch 0   No facility-administered medications prior to visit.    Allergies  Allergen Reactions   Penicillins Hives    Did it involve swelling of the face/tongue/throat, SOB, or low BP? Unknown Did it involve sudden or severe rash/hives, skin peeling, or any reaction on the inside of your mouth or nose? Unknown Did you need to seek medical attention at a hospital or doctor's office? Unknown When did it last happen?   Pt doesn't remember he said he was a child    If all above answers are "NO", may proceed with cephalosporin use.    Flexeril [Cyclobenzaprine]     seizures    Review of Systems  Constitutional:  Negative for chills, fever and malaise/fatigue.  Eyes:  Negative for blurred vision and double vision.  Respiratory:  Negative for shortness of breath.   Cardiovascular:  Negative for chest pain, palpitations and leg swelling.  Gastrointestinal:  Negative for abdominal pain, constipation, diarrhea, nausea and vomiting.  Neurological:  Negative for dizziness, focal weakness and headaches.  Psychiatric/Behavioral:  Negative  for suicidal ideas. The patient  is nervous/anxious.        Objective:    Physical Exam Constitutional:      General: He is not in acute distress.    Appearance: He is not ill-appearing.  Eyes:     Extraocular Movements: Extraocular movements intact.     Conjunctiva/sclera: Conjunctivae normal.  Cardiovascular:     Rate and Rhythm: Normal rate.  Pulmonary:     Effort: Pulmonary effort is normal.  Musculoskeletal:     Cervical back: Normal range of motion and neck supple.  Skin:    General: Skin is warm and dry.  Neurological:     General: No focal deficit present.     Mental Status: He is alert and oriented to person, place, and time.  Psychiatric:        Mood and Affect: Mood normal.        Behavior: Behavior normal.        Thought Content: Thought content normal.      BP (!) 140/74   Pulse 98   Temp 97.6 F (36.4 C) (Temporal)   Ht 5\' 11"  (1.803 m)   Wt 242 lb (109.8 kg)   SpO2 98%   BMI 33.75 kg/m  Wt Readings from Last 3 Encounters:  12/12/23 242 lb (109.8 kg)  12/02/23 230 lb (104.3 kg)  11/27/23 230 lb (104.3 kg)       Assessment & Plan:   Problem List Items Addressed This Visit   None Visit Diagnoses       Secondary hypertension    -  Primary   Relevant Medications   cloNIDine (CATAPRES - DOSED IN MG/24 HR) 0.2 mg/24hr patch      Increase clonidine to 0.2 mg/24 HOUR patch/weekly and continue to monitor BP at home.  As he continues to detox, his BP should also respond.  Follow up with me in 6 weeks.   I have discontinued Ulus L. Charland's cloNIDine. I am also having him start on cloNIDine. Additionally, I am having him maintain his omeprazole, famotidine, ondansetron, traZODone, and baclofen.  Meds ordered this encounter  Medications   cloNIDine (CATAPRES - DOSED IN MG/24 HR) 0.2 mg/24hr patch    Sig: Place 1 patch (0.2 mg total) onto the skin once a week.    Dispense:  4 patch    Refill:  1    Supervising Provider:   Hillard Danker  A [[id number]]

## 2023-12-12 NOTE — Progress Notes (Signed)
 This was a no charge and was in person.  Changes completed

## 2023-12-12 NOTE — Patient Instructions (Addendum)
 I am increasing your blood pressure medication to clonidine 0.2mg /24 hour patch to be changed weekly.   Follow up with me in 6 weeks.

## 2023-12-12 NOTE — Telephone Encounter (Signed)
 Pharmacy Patient Advocate Encounter

## 2023-12-13 ENCOUNTER — Ambulatory Visit (HOSPITAL_COMMUNITY)

## 2023-12-16 ENCOUNTER — Ambulatory Visit (INDEPENDENT_AMBULATORY_CARE_PROVIDER_SITE_OTHER): Admitting: Licensed Clinical Social Worker

## 2023-12-16 DIAGNOSIS — F4312 Post-traumatic stress disorder, chronic: Secondary | ICD-10-CM

## 2023-12-16 DIAGNOSIS — F1920 Other psychoactive substance dependence, uncomplicated: Secondary | ICD-10-CM | POA: Diagnosis not present

## 2023-12-16 NOTE — Progress Notes (Signed)
 Daily Group Progress Note   Program: CD IOP     Group Time: 9 a.m. to 12 p.m.       Type of Therapy: Process and Psychoeducational    Topic: The therapists check in with group members, assess for SI/HI/psychosis and overall level of functioning. The therapists inquire about sobriety date and number of community support meetings attended since last session.    The therapists show the first half of the video, "Breaking the Addiction Cycle," having group members complete and discuss the first few exercises.   Summary: Andrew Castillo presents rating both his depression and anxiety as a 5. He says that the conjoint session with his fiance was helpful in that she has a better understanding of addiction so is "easier" on hi and he is treating her better in return.   He says that he was started on Celebrex asking if this was okay with the therapist explaining that given that it is not a controlled substance that it does not present a problem as is relates to IOP. He says that his blood pressure medication was recently doubled.  In doing the group exercises, Andrew Castillo says that he used to believe that an alcoholic was an old homeless black guy and that a drug addict was like a woman know as "crack head Hilda Lias." He says that she was between 35 to 40, had no teeth, was always asking for money, and tested car handles to see if she could get the doors open. He says that alcohol in his family was always okay as long as the person was over 18 and that drugs were bad except for weed.   Andrew Castillo says that his favorite impaired thought was that he could not be an addict because he did not use the amount addicts used as he only was using $20 per day. He says that the consequence that caused him to see that he had a problem was having a seizure and coming to and seeing the fear in his fiance and daughter's faces.   He says that he has the same sobriety date and will have a month sober on April 6th. He describes his mood as  "happy" and "optimistic" noting that things are so much better as he feels better emotionally and physically. He went to the birthday party over the weekend saying that he took this therapist's advice as he packed up and left before drinking started. He is attending virtual meetings in [place] with his fiance listening in on them as well. He says that he is also doing a couple's meditation and has attended [place] as a family.    Progress Towards Goals: Andrew Castillo reports no change in his sobriety date.     UDS collected: Yes Results: Yes, positive for [facility]   AA/NA attended?:  Yes  Sponsor?:  No   Alene Mires, MA, [place], [facility], LCAS Remigio Eisenmenger, [place], LCAS 12/16/2023

## 2023-12-18 ENCOUNTER — Telehealth (HOSPITAL_COMMUNITY): Payer: Self-pay

## 2023-12-18 ENCOUNTER — Ambulatory Visit (INDEPENDENT_AMBULATORY_CARE_PROVIDER_SITE_OTHER): Admitting: Licensed Clinical Social Worker

## 2023-12-18 DIAGNOSIS — F1920 Other psychoactive substance dependence, uncomplicated: Secondary | ICD-10-CM

## 2023-12-18 DIAGNOSIS — F4312 Post-traumatic stress disorder, chronic: Secondary | ICD-10-CM

## 2023-12-18 NOTE — Progress Notes (Signed)
 Daily Group Progress Note   Program: CD IOP     Group Time: 9 a.m. to 12 p.m.      Type of Therapy: Process and Psychoeducational    Topic: The therapists check in with group members, assess for SI/HI/psychosis and overall level of functioning. The therapists inquire about sobriety date and number of community support meetings attended since last session.    The therapists facilitate group discussions and present information about the treatment interfering impact of cannabis on bipolar disorder, the fact that the First Step in recovery is not only a thinking step but an action step, what a resentment chip is and why people pick them up, the need to autopsy a relapse so as to learn from it and make changes, and the fact that a Sponsee helps a Sponsor stay sober in addition to the Sponsor helping the [place].    Summary: Andrew Castillo presents rating his depression as a 4 and his anxiety as a 6. He describes his mood as "proud" and "optimistic."  He says that he is proud as he went golfing and when some guys broke out some pot that he packed his things and left. During the group discussion on relapse prevention, he asks about what people can do in addition to putting barriers in their own path with the therapist suggesting that a person can call supports in AA or NA and let them know he is thinking about using so as to not wrestle with the urge alone.  Andrew Castillo does not have a Sponsor saying that he has not asked anyone not wanting to "burden people" which is challenged by the therapists and other group members.   When Andrew Castillo mentions having contact with a male in his Twelve Step group in [place], he is encouraged to have contact only with men in the group.    Progress Towards Goals: Andrew Castillo reports no change in his sobriety date.    UDS collected: No Results: No   AA/NA attended?:  Yes  Sponsor?:  No   Alene Mires, MA, LCSW, [facility], LCAS Remigio Eisenmenger, [place], LCAS 12/18/2023

## 2023-12-19 ENCOUNTER — Telehealth (HOSPITAL_COMMUNITY): Payer: Self-pay | Admitting: *Deleted

## 2023-12-19 NOTE — Telephone Encounter (Signed)
 [facility] testing complete and submitted online. Pick up scheduled for tomorrow between 8-11am was the earliest available.

## 2023-12-20 ENCOUNTER — Ambulatory Visit (HOSPITAL_COMMUNITY): Admitting: Licensed Clinical Social Worker

## 2023-12-20 DIAGNOSIS — F1920 Other psychoactive substance dependence, uncomplicated: Secondary | ICD-10-CM

## 2023-12-20 DIAGNOSIS — F4312 Post-traumatic stress disorder, chronic: Secondary | ICD-10-CM
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
## 2023-12-20 NOTE — Telephone Encounter (Signed)
 Did not mean to Create

## 2023-12-23 ENCOUNTER — Ambulatory Visit (INDEPENDENT_AMBULATORY_CARE_PROVIDER_SITE_OTHER)

## 2023-12-23 DIAGNOSIS — F4312 Post-traumatic stress disorder, chronic: Secondary | ICD-10-CM

## 2023-12-23 DIAGNOSIS — F1920 Other psychoactive substance dependence, uncomplicated: Secondary | ICD-10-CM | POA: Diagnosis not present

## 2023-12-23 NOTE — Progress Notes (Signed)
 Genesight testing complete and submitted online. Pick up scheduled for tomorrow between 8-11am was the earliest available.

## 2023-12-23 NOTE — Progress Notes (Signed)
 Daily Group Progress Note   Program: CD IOP     Group Time: 9 a.m. to 12 p.m.      Type of Therapy: Process and Psychoeducational    Topic: The therapists check in with group members, assess for SI/HI/psychosis and overall level of functioning. The therapists inquire about sobriety date and number of community support meetings attended since last session.    The therapists shows the last 2 minutes of "Breaking the Addiction Cycle". The therapists discussed the "ritual" and how to interrupt the cycle at this point.  Therapist gives an overall of how to treat Substance Use.  This discusses the following subjects and prompts group members to respond and ask questions: Definition of Substance Use, slides of the brain when healthy, on substances and in 14 months of recovery, categories of substance use disorders, levels of severity, ASAM criteria, stages of change model, Socrates test, Stages of Recovery including abstinence, repair stage, late stage and the cause of relapse. Acute and Post Acute Withdrawal syndrome and lengths of time for each, opioids and MAT including facts and myths, cannabis facts and myths and effects on health.  Summary:  Andrew Castillo rates his depression as a "4" and his anxiety as a "6".  He identifies his emotions as "optimistic and passionate". He reports he is attending meetings but does not yet have a sponsor. Andrew Castillo says he used to have a place in his wallet where he kept a cocaine stash. He reports as a replacement behavior he now keeps his chip in the same place in his wallet. In response to the therapist discussing how people in recovery may relapse when they lose a loved one, Andrew Castillo discusses addiction in his family and notes that when his grandmother died his grandfather began drinking heavy though he had been in recovery prior to this.  Andrew Castillo says his take away for today from group is that he learned about cross addiction.  Progress Towards Goals: Andrew Castillo reports the  same sobriety date.   UDS collected: yes  Results: none   AA/NA attended?:  Yes  Sponsor?:  No   Andrew Mires, MA, [place], [facility], LCAS Andrew Castillo, [place], LCAS 12/23/2023

## 2023-12-24 ENCOUNTER — Other Ambulatory Visit (HOSPITAL_COMMUNITY): Payer: Self-pay | Admitting: Medical

## 2023-12-25 ENCOUNTER — Other Ambulatory Visit (HOSPITAL_COMMUNITY): Payer: Self-pay | Admitting: Medical

## 2023-12-25 ENCOUNTER — Ambulatory Visit (INDEPENDENT_AMBULATORY_CARE_PROVIDER_SITE_OTHER)

## 2023-12-25 DIAGNOSIS — F1920 Other psychoactive substance dependence, uncomplicated: Secondary | ICD-10-CM

## 2023-12-25 DIAGNOSIS — F4312 Post-traumatic stress disorder, chronic: Secondary | ICD-10-CM

## 2023-12-25 DIAGNOSIS — F1994 Other psychoactive substance use, unspecified with psychoactive substance-induced mood disorder: Secondary | ICD-10-CM

## 2023-12-25 NOTE — Progress Notes (Signed)
 Daily Group Progress Note   Program: CD IOP     Group Time: 9 a.m. to 12 p.m.      Type of Therapy: Process and Psychoeducational    Topic: The therapists check in with group members, assess for SI/HI/psychosis and overall level of functioning. The therapists inquire about sobriety date and number of community support meetings attended since last session.    The therapists show the Viviann Spare Melimis video on Relapse Prevention which covers the stages of relapse, including emotional, mental and physical ages. Therapists discuss the emotional stage of relapse which involves a time when one is not necessarily thinking of using but one's emotions are setting them up for a future relapse. Therapists discuss the important of self care including daily activities of living such as eating, having a regular sleep schedule, exercising etc. From the emotional stage of relapse one can then transition into the mental stage of relapse. Therapist explain how this stage people with addictions start to think about use, however there is a part of one that wants to use and another part does not want to use. From there, starts to think about what one can use.  It is important to at this juncture to be able to distract to interrupt this thought process. Therapists share the last stage of relapse is the physical stage where one uses a substance. Therapists prompt group members to share times when they have seen themselves in the various stages. Therapists also discuss HALT (hungry, angry, loneliness and tired) as triggers in the relapse stage.  Summary:  Andrew Castillo rates his depression as a "4" and his anxiety as a "6". Andrew Castillo reports the same sobriety date. He identifies his emotions as "optimistic and passionate". Andrew Castillo says he has a plan to obtain a sponsor.  He says he has a list of men in the meetings and wants to get to know them before he asks one to be his sponsor. He says he does call these men for support. Andrew Castillo  discusses the mental stage of relapse and says he is learning to use distractions to interrupt moving into the physical stage of relapse.   Progress Towards Goals: reports the same sobriety date UDS collected: no Results: none   AA/NA attended?:  Yes  Sponsor?:  No   Alene Mires, MA, [place], [facility], LCAS Remigio Eisenmenger, LMFT, LCAS 12/25/2023

## 2023-12-27 ENCOUNTER — Ambulatory Visit (INDEPENDENT_AMBULATORY_CARE_PROVIDER_SITE_OTHER): Admitting: Medical

## 2023-12-27 ENCOUNTER — Encounter (HOSPITAL_COMMUNITY): Payer: Self-pay | Admitting: Medical

## 2023-12-27 DIAGNOSIS — F1420 Cocaine dependence, uncomplicated: Secondary | ICD-10-CM

## 2023-12-27 DIAGNOSIS — F12288 Cannabis dependence with other cannabis-induced disorder: Secondary | ICD-10-CM

## 2023-12-27 DIAGNOSIS — Z6372 Alcoholism and drug addiction in family: Secondary | ICD-10-CM

## 2023-12-27 DIAGNOSIS — F1220 Cannabis dependence, uncomplicated: Secondary | ICD-10-CM

## 2023-12-27 DIAGNOSIS — J4520 Mild intermittent asthma, uncomplicated: Secondary | ICD-10-CM

## 2023-12-27 DIAGNOSIS — F4312 Post-traumatic stress disorder, chronic: Secondary | ICD-10-CM

## 2023-12-27 DIAGNOSIS — Z8669 Personal history of other diseases of the nervous system and sense organs: Secondary | ICD-10-CM

## 2023-12-27 DIAGNOSIS — F1994 Other psychoactive substance use, unspecified with psychoactive substance-induced mood disorder: Secondary | ICD-10-CM

## 2023-12-27 DIAGNOSIS — F1020 Alcohol dependence, uncomplicated: Secondary | ICD-10-CM

## 2023-12-27 DIAGNOSIS — K2100 Gastro-esophageal reflux disease with esophagitis, without bleeding: Secondary | ICD-10-CM

## 2023-12-27 DIAGNOSIS — J301 Allergic rhinitis due to pollen: Secondary | ICD-10-CM

## 2023-12-27 DIAGNOSIS — F1924 Other psychoactive substance dependence with psychoactive substance-induced mood disorder: Secondary | ICD-10-CM

## 2023-12-27 DIAGNOSIS — Z659 Problem related to unspecified psychosocial circumstances: Secondary | ICD-10-CM

## 2023-12-27 DIAGNOSIS — F1920 Other psychoactive substance dependence, uncomplicated: Secondary | ICD-10-CM

## 2023-12-27 MED ORDER — NALTREXONE HCL 50 MG PO TABS: 50.0000 mg | ORAL_TABLET | Freq: Every day | ORAL | 0 refills | Status: DC
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
## 2023-12-30 ENCOUNTER — Ambulatory Visit (INDEPENDENT_AMBULATORY_CARE_PROVIDER_SITE_OTHER): Admitting: Licensed Clinical Social Worker

## 2023-12-30 ENCOUNTER — Other Ambulatory Visit (INDEPENDENT_AMBULATORY_CARE_PROVIDER_SITE_OTHER): Admitting: Medical

## 2023-12-30 DIAGNOSIS — F17210 Nicotine dependence, cigarettes, uncomplicated: Secondary | ICD-10-CM

## 2023-12-30 DIAGNOSIS — K2100 Gastro-esophageal reflux disease with esophagitis, without bleeding: Secondary | ICD-10-CM

## 2023-12-30 DIAGNOSIS — F12288 Cannabis dependence with other cannabis-induced disorder: Secondary | ICD-10-CM

## 2023-12-30 DIAGNOSIS — F1920 Other psychoactive substance dependence, uncomplicated: Secondary | ICD-10-CM

## 2023-12-30 DIAGNOSIS — F1020 Alcohol dependence, uncomplicated: Secondary | ICD-10-CM

## 2023-12-30 DIAGNOSIS — F1420 Cocaine dependence, uncomplicated: Secondary | ICD-10-CM

## 2023-12-30 DIAGNOSIS — F4312 Post-traumatic stress disorder, chronic: Secondary | ICD-10-CM

## 2023-12-30 DIAGNOSIS — Z659 Problem related to unspecified psychosocial circumstances: Secondary | ICD-10-CM

## 2023-12-30 DIAGNOSIS — F1220 Cannabis dependence, uncomplicated: Secondary | ICD-10-CM

## 2023-12-30 DIAGNOSIS — F1994 Other psychoactive substance use, unspecified with psychoactive substance-induced mood disorder: Secondary | ICD-10-CM

## 2023-12-30 DIAGNOSIS — Z6372 Alcoholism and drug addiction in family: Secondary | ICD-10-CM

## 2023-12-30 MED ORDER — BUPROPION HCL ER (SR) 100 MG PO TB12: 100.0000 mg | ORAL_TABLET | Freq: Two times a day (BID) | ORAL | 2 refills | Status: AC

## 2023-12-30 NOTE — Progress Notes (Signed)
 Patient ID: Andrew Castillo, male   DOB: 1995-10-24, 28 y.o.   MRN: [id number] Pt requesting anti smoking treatment Referral info for [facility] Smoking Cessation program provided and MAT Zyban (Wellbutrin SR)

## 2023-12-30 NOTE — Progress Notes (Signed)
 Daily Group Progress Note   Program: CD IOP     Group Time: 9 a.m. to 12 p.m.      Type of Therapy: Process and Psychoeducational    Topic: The therapists check in with group members, assess for SI/HI/psychosis and overall level of functioning. The therapists inquire about sobriety date and number of community support meetings attended since last session.    The therapists explains the purpose of having a Sponsor and shows group members NA Step worksheets so as to have a better understanding of what working steps involves. The therapists continues presenting information on relapse prevention and the five rules of recovery focusing on redefining fun, learning from setbacks, being comfortable with being uncomfortable, and rule number one of the five rules of recovery; change your life.   Summary: Adhvik presents rating his depression as a 2 and his anxiety as a 4. He says that he has the same sobriety date. He describes his mood as "happy" and "peaceful."  He does not have a Marketing executive and admits to having a fear about a Sponsor being someone who tells him what to do. Also, he seems to suggest that the people in recovery with whom he has contact are enough. After the discussion on the function of a Sponsor and the worksheets, he is able to see why he would need a Marketing executive. He is encouraged to ask two former members of [facility] with whom he has contact about their view on a Sponsor as both have good working relationships with their Sponsors.   He says that he is no longer taking the Baclofen or Trazodone saying that he does not seem to need them and does not want to have to depend on anything as a crutch; however, he does say that he would start taking them again if needed.   Raylon reports not needing these medications but later talks about finding himself telling himself that he "needs" to "get high" if upset after a baseball game but is able to talk himself out of it by reminding himself that he can  look himself in the mirror now that he is not using as he looks so much better. He also expresses fears about going on vacation in June to the beach saying that he typically gets wasted on vacation.  The therapists point out that he has only two weeks of clean time, is very green, and is on the "pink cloud" so needs to proceed with caution.    Progress Towards Goals: Kodey reports no change in his sobriety.   UDS collected: Yes Results: No   AA/NA attended?:  Yes  Sponsor?:  No   Will Hare, MA, [place], [facility], LCAS [name], [place], LCAS 12/30/2023

## 2023-12-31 ENCOUNTER — Telehealth: Admitting: Physician Assistant

## 2023-12-31 ENCOUNTER — Encounter: Payer: Self-pay | Admitting: Physician Assistant

## 2023-12-31 DIAGNOSIS — Z720 Tobacco use: Secondary | ICD-10-CM

## 2023-12-31 DIAGNOSIS — Z716 Tobacco abuse counseling: Secondary | ICD-10-CM

## 2023-12-31 DIAGNOSIS — F17210 Nicotine dependence, cigarettes, uncomplicated: Secondary | ICD-10-CM

## 2023-12-31 MED ORDER — NICOTINE 14 MG/24HR TD PT24: 14.0000 mg | MEDICATED_PATCH | Freq: Every day | TRANSDERMAL | 0 refills | Status: DC

## 2023-12-31 NOTE — Progress Notes (Signed)
 Virtual Visit Consent  Andrew Castillo, you are scheduled for a virtual visit with a [facility] provider today. Just as with appointments in the office, your consent must be obtained to participate. Your consent will be active for this visit and any virtual visit you may have with one of our providers in the next 365 days. If you have a MyChart account, a copy of this consent can be sent to you electronically.  As this is a virtual visit, video technology does not allow for your provider to perform a traditional examination. This may limit your provider's ability to fully assess your condition. If your provider identifies any concerns that need to be evaluated in person or the need to arrange testing (such as labs, EKG, etc.), we will make arrangements to do so. Although advances in technology are sophisticated, we cannot ensure that it will always work on either your end or our end. If the connection with a video visit is poor, the visit may have to be switched to a telephone visit. With either a video or telephone visit, we are not always able to ensure that we have a secure connection.  By engaging in this virtual visit, you consent to the provision of healthcare and authorize for your insurance to be billed (if applicable) for the services provided during this visit. Depending on your insurance coverage, you may receive a charge related to this service.  I need to obtain your verbal consent now. Are you willing to proceed with your visit today? Andrew Castillo has provided verbal consent on 12/31/2023 for a virtual visit (video or telephone). Andrew Castillo, [place]  Date: 12/31/2023 10:01 AM  Virtual Visit via Video Note  I, Andrew Castillo, connected with  MONTAY VANVOORHIS  ([id number], 01-29-1996) on 12/31/23 at 10:00 AM EDT by a video-enabled telemedicine application and verified that I am speaking with the correct person using two identifiers.  Location: Patient: Virtual Visit  Location Patient: Home Provider: Virtual Visit Location Provider: Home Office   I discussed the limitations of evaluation and management by telemedicine and the availability of in person appointments. The patient expressed understanding and agreed to proceed.    History of Present Illness: Ronaldo is wanting to discuss their tobacco use and is seeking assistance with cessation. Patient has been smoking/using around 10-15 cigarettes per day for the past 6 years -- [place] (Menthol). There has been prior attempts to quit without success. Notes he has recently cut down on cigarette use to 3-4 per day over the past several weeks. Has been able to stop completely for the past week, but with significant cravings. Is also using a disposable nicotine vape (Quasar) going through one device every month. Is still using this as before.   Has history of substance use including cocaine, alcohol and marijuana. Has been in an intensive outpatient program over the past several months. Has been completely sober for the past month. Is currently on a regimen of Naltrexone 50mg  daily and Baclofen as needed (no recent use of baclofen). Is going to counseling three times weekly (Monday, Wednesday and Friday) along with medication management visits every 2 weeks with his [facility] provider -- [name].   Notes mood has been doing well. Initially after stopping other substance use, noted decrease in mood but this has resolved. Is sleeping much better with better control of anxiety, currently on Trazodone 50 at bedtime. Notes eating much better now. Has been able to think more clearly and notes  his relationship with his fiancee is much improved. Just closed on a house which he is very excited about. Notes he wants to stop tobacco/nicotine use altogether as he wants to continue to work on health, and for his 27-year old to reduce her exposures.     Triggers: The following trigger(s) for use has/have been identified: psychological:  after meals, drinking alcohol, car  Withdrawal Symptoms: Identified withdrawal symptoms: sleeplessness, irritable mood.  State of Readiness: Patient feels overall Ready to quit. Concerns/Barriers to Cessation - Other Substance Use -- under control and closely managed. Sober for 1+ months.  Observations/Objective: Patient is well-developed, well-nourished in no acute distress.  Resting comfortably  at home.  Head is normocephalic, atraumatic.  No labored breathing.  Speech is clear and coherent with logical content.  Patient is alert and oriented at baseline.   Assessment and Plan: There are no diagnoses linked to this encounter.  - Patient is currently at the following State of Change: Preparation -- actively planning an attempt to quit - Comorbid conditions identified: Anxiety/Depression, polysubstance use, asthma. - Reviewed impacts of smoking on patient's health. - Patient already has restrictions in place for himself for cig/vape use -- none in home or cars. Is Sober so prior triggers of use with other substances has been removed. - Quit date set for 4/11 - last cigarette use and 4/16 - anticipated last vape use.  -Script for Wellbutrin SR 150 mg BID given by [facility] provider yesterday. Giving history of seizures (substance induced) would still be mindful of use of this medication, giving potential for relapse. Discussed potential for NRT use first. He does have history of HTN thought to be related to polysubstance use. Is currently on Clonidine patches with improvement in BP -- checked M,W,F at counseling appts. This AM BP 135/84 and asymptomatic. After discussion, will hold off on him starting Wellbutrin fro now. Will start NRT with patches and monitor BP closely.   - I have prescribed Nicotine patches.  The patient is to apply the patch to a non-hairy skin site and rotate the site daily. Will begin with starting dose of Because you were smoking < 10 cigarettes per day, I have prescribed  a 14 mg patch for you to apply daily for 6 weeks. Then we will plan to decrease to a 7 mg patch daily for 2 additional weeks. We will discuss again in detail at your follow-up visit!  - Other resources including the [employer] and Department of Health and [employer] -- have been included in the patient's written instructions and sent to their MyChart. - He will continue close follow-up with PCP and specialists.  - Plan for follow-up in 10 days. Appt scheduled.  Follow Up Instructions: I discussed the assessment and treatment plan with the patient. The patient was provided an opportunity to ask questions and all were answered. The patient agreed with the plan and demonstrated an understanding of the instructions.  A copy of instructions were sent to the patient via MyChart unless otherwise noted below.   Time:  I have spent 30 minutes with the patient via telehealth technology in tobacco cessation counseling.    Hyla Maillard, PA-C

## 2023-12-31 NOTE — Patient Instructions (Signed)
 Andrew Castillo, thank you for joining Hyla Maillard, PA-C for today's virtual visit.  While this provider is not your primary care provider (PCP), if your PCP is located in our provider database this encounter information will be shared with them immediately following your visit.  Consent: (Patient) Andrew Castillo provided verbal consent for this virtual visit at the beginning of the encounter.  Current Medications:  Current Outpatient Medications:    nicotine (NICODERM CQ - DOSED IN MG/24 HOURS) 14 mg/24hr patch, Place 1 patch (14 mg total) onto the skin daily., Disp: 30 patch, Rfl: 0   baclofen (LIORESAL) 10 MG tablet, Take 1 tablet (10 mg total) by mouth 3 (three) times daily., Disp: 90 tablet, Rfl: 1   buPROPion ER (WELLBUTRIN SR) 100 MG 12 hr tablet, Take 1 tablet (100 mg total) by mouth 2 (two) times daily., Disp: 60 tablet, Rfl: 2   cloNIDine (CATAPRES - DOSED IN MG/24 HR) 0.2 mg/24hr patch, Place 1 patch (0.2 mg total) onto the skin once a week., Disp: 4 patch, Rfl: 1   famotidine (PEPCID) 40 MG tablet, Take 1 tablet (40 mg total) by mouth at bedtime., Disp: 90 tablet, Rfl: 3   naltrexone (DEPADE) 50 MG tablet, Take 1 tablet (50 mg total) by mouth daily., Disp: 90 tablet, Rfl: 0   omeprazole (PRILOSEC) 40 MG capsule, Take 1 capsule (40 mg total) by mouth 2 (two) times daily before a meal. 30 mins before breakfast and supper, Disp: 180 capsule, Rfl: 3   ondansetron (ZOFRAN) 8 MG tablet, Take 1 tablet (8 mg total) by mouth every 8 (eight) hours as needed for nausea or vomiting., Disp: 30 tablet, Rfl: 2   traZODone (DESYREL) 50 MG tablet, Take 1 tablet (50 mg total) by mouth at bedtime as needed and may repeat dose one time if needed for sleep., Disp: 60 tablet, Rfl: 0   When you are getting low on your smoking cessation medications, schedule your next video appointment as previously discussed. This way we can follow-up with you, make any necessary adjustments/changes, and get next  fill of medication sent in to your pharmacy.   Follow-Up: Call back or seek an in-person evaluation if the symptoms worsen or if the condition fails to improve as anticipated.  Other Instructions  Congratulations for your interest in quitting smoking!  Quitting smoking is one of the most important things you can do to protect your health.  We are here to help you! Did you know that if you quit smoking 1 pack per day you could save up to $2550.00 per year?  Medications are not appropriate for everyone depending upon your situation and any health issues you may have. If we do prescribe medication, it will be for 1 month at time and you will be required to have a follow up Video visit at 1 month to assess how you are doing and if there are any side effects.  This could be for up to a total of 3 months.    Support from your friends, family and work colleagues is very important. Please let them know that you are trying to stop smoking so that they understand your need for support in this goal!  - Remove tobacco products from your environment - Set a quit date ideally within 2 weeks. - You should totally abstain from smoking after your quit date. A single puff could hurt your progress or cause you to relapse - If there are others in your household that  smoke, ask them to try to quit or abstain from smoking in your presence  You may notice nicotine withdrawal symptoms such as increased appetite and weight gain, changes in mood, insomnia, irritability and/or anxiety. These symptoms peak in the first three days after smoking cessation and subside over the next 3-4 weeks.   I have prescribed Nicotine patches.  The patient is to apply the patch to a non-hairy skin site and rotate the site daily. Will begin with starting dose of Because you were smoking < 10 cigarettes per day, I have prescribed a 14 mg patch for you to apply daily for 6 weeks. Then we will plan to decrease to a 7 mg patch daily for 2  additional weeks. We will discuss again in detail at your follow-up visit!  A combination of behavioral and medication can improve the success of you quitting smoking. You may want to use the 1-800-QUIT-NOW free support line.  Also, the Department of Health and Human Services provides Smoke free apps for smartphones: SharedCustomer.fi  If you happen to break your plan and have a cigarette, keep taking your medications and continue to try to abstain.  Do not give up!  MAKE SURE YOU  Take any prescribed medications only as instructed.  If you miss a dose of medication, take the next dose when it is due and get back on schedule. DO NOT double up on medications.  Mark your calendar to do your Follow Up Smoking Cessation Visit in one month   If you have been instructed to have an in-person evaluation today at a local Urgent Care facility, please use the link below. It will take you to a list of all of our available [facility], including address, phone number and hours of operation. Please do not delay care.  [facility]  If you or a family member do not have a primary care provider, use the link below to schedule a visit and establish care. When you choose a [facility] primary care physician or advanced practice provider, you gain a long-term partner in health. Find a Primary Care Provider  Learn more about [facility] in-office and virtual care options: [facility] - Get Care Now

## 2024-01-01 ENCOUNTER — Ambulatory Visit (INDEPENDENT_AMBULATORY_CARE_PROVIDER_SITE_OTHER): Admitting: Licensed Clinical Social Worker

## 2024-01-01 DIAGNOSIS — F1994 Other psychoactive substance use, unspecified with psychoactive substance-induced mood disorder: Secondary | ICD-10-CM

## 2024-01-01 DIAGNOSIS — F4312 Post-traumatic stress disorder, chronic: Secondary | ICD-10-CM

## 2024-01-01 DIAGNOSIS — F1920 Other psychoactive substance dependence, uncomplicated: Secondary | ICD-10-CM | POA: Diagnosis not present

## 2024-01-01 NOTE — Progress Notes (Signed)
 Daily Group Progress Note   Program: CD IOP     Group Time: 9 a.m. to 12 p.m.      Type of Therapy: Process and Psychoeducational    Topic: The therapists check in with group members, assess for SI/HI/psychosis and overall level of functioning. The therapists inquire about sobriety date and number of community support meetings attended since last session.    The therapists answer questions about MAT for alcohol cravings and address group members' issues with cravings. The therapists discuss and facilitate group discussion concerning rules 2-4 of the five rules of recovery which are be completely honest, ask for help, and practice self-care. The therapist shows a short video on honesty and recovery and solicits group members' feedback about the video. The therapists discuss the fact that some people label all efforts at self-care as being "selfish" especially persons who are adult children of addicts. The therapists discuss the importance of mind-body relaxation and the need for downtime to relax.   Summary: Murrel presents rating depression as a 3 and his anxiety as a 4. He admits to having had thoughts of using after an argument with his fiance over the impending moving to Purl's grandfather's house on June 1st. Jade did not use and resumed his Baclofen and Naltrexone.  In discussing the situation concerning the move it sounds that Hussien made this decision unilaterally with his fiance not wanting to move from their current home. Virlan cannot understand why she does not want to move laying out the amount of money and driving they will save. It sounds that she does not want to have to be involved in the move and clean-up of the old house. Kristjan assured her he and friends would do the move and that he would clean up; however, she believes she will be stuck with the cleanup. He admits that there were times in the past that he told her he would clean up but did not.  The therapist models  ways [place] can talk to her using active listening and avoiding "why" questions to gain a better understanding of her concerns and recommends bringing her in for another family session.   Reyhan says that his mood is "frustrated" but "proud." He still has no Sponsor with the therapist observing that Benen said a couple of sessions ago that he was going to leave group and get a Marketing executive, He says that he did ask someone but the guy was not taking [employer]. The therapist and group members suggest ways for him to find a Sponsor.    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   UDS collected: No Results: No   AA/NA attended?:  Yes  Sponsor?:  No   Will Hare, MA, [place], [facility], LCAS [name], [place], LCAS 01/01/2024

## 2024-01-02 NOTE — Telephone Encounter (Signed)
 In CDIOP

## 2024-01-03 ENCOUNTER — Encounter (HOSPITAL_COMMUNITY): Payer: Self-pay

## 2024-01-03 ENCOUNTER — Telehealth (HOSPITAL_COMMUNITY): Payer: Self-pay | Admitting: Licensed Clinical Social Worker

## 2024-01-03 ENCOUNTER — Ambulatory Visit (HOSPITAL_COMMUNITY)

## 2024-01-03 NOTE — Telephone Encounter (Signed)
 The therapist attempts to reach [facility] leaving a HIPAA-compliant voicemail.  Melynda Stagger, MA, LCSW, [facility], LCAS 01/03/2024

## 2024-01-06 ENCOUNTER — Telehealth (HOSPITAL_COMMUNITY): Payer: Self-pay | Admitting: Licensed Clinical Social Worker

## 2024-01-06 ENCOUNTER — Ambulatory Visit (HOSPITAL_COMMUNITY)

## 2024-01-06 ENCOUNTER — Telehealth (HOSPITAL_COMMUNITY): Payer: Self-pay

## 2024-01-06 NOTE — Telephone Encounter (Signed)
 Therapist outreached Andrew Castillo as he missed IOP today/  He answered and therapist confirms she is speaking to the correct person by obtaining two identifiers.  Therapist says she is calling as [name] was missed in group. He says he has a cold or something. He says his daughter was sick this weekend, so he probably got what she had. Therapist asks if he thinks he may return on Wednesday and he says if he is well, he will.  [name], MS, LMFT, LCAS 01-03-24

## 2024-01-06 NOTE — Telephone Encounter (Signed)
 Andrew Castillo leaves a voicemail on Friday saying that he was unable to attend group because he did not have a babysitter for his daughter. He leaves a message this morning saying that he cannot attend as he and his daughter woke up with a fever.  Melynda Stagger, MA, LCSW, [facility], LCAS 01/06/2024

## 2024-01-08 ENCOUNTER — Ambulatory Visit (HOSPITAL_COMMUNITY)

## 2024-01-08 ENCOUNTER — Telehealth (HOSPITAL_COMMUNITY): Payer: Self-pay | Admitting: Licensed Clinical Social Worker

## 2024-01-08 ENCOUNTER — Other Ambulatory Visit (HOSPITAL_COMMUNITY): Payer: Self-pay | Admitting: Medical

## 2024-01-08 NOTE — Telephone Encounter (Signed)
 The therapist receives a voicemail from [place] saying that he will not be in group again today as he is still sick.  Melynda Stagger, MA, LCSW, [facility], LCAS 01/08/2024

## 2024-01-09 ENCOUNTER — Encounter: Payer: Self-pay | Admitting: Medical

## 2024-01-09 ENCOUNTER — Other Ambulatory Visit (HOSPITAL_COMMUNITY): Payer: Self-pay | Admitting: Medical

## 2024-01-09 ENCOUNTER — Encounter: Payer: Self-pay | Admitting: Physician Assistant

## 2024-01-09 ENCOUNTER — Telehealth: Admitting: Physician Assistant

## 2024-01-09 DIAGNOSIS — F17210 Nicotine dependence, cigarettes, uncomplicated: Secondary | ICD-10-CM | POA: Diagnosis not present

## 2024-01-09 DIAGNOSIS — R6889 Other general symptoms and signs: Secondary | ICD-10-CM

## 2024-01-09 DIAGNOSIS — R509 Fever, unspecified: Secondary | ICD-10-CM | POA: Diagnosis not present

## 2024-01-09 DIAGNOSIS — R0981 Nasal congestion: Secondary | ICD-10-CM | POA: Diagnosis not present

## 2024-01-09 DIAGNOSIS — Z716 Tobacco abuse counseling: Secondary | ICD-10-CM | POA: Diagnosis not present

## 2024-01-09 DIAGNOSIS — Z720 Tobacco use: Secondary | ICD-10-CM

## 2024-01-09 MED ORDER — VARENICLINE TARTRATE 0.5 MG PO TABS: ORAL_TABLET | ORAL | 0 refills | Status: DC

## 2024-01-09 MED ORDER — OSELTAMIVIR PHOSPHATE 75 MG PO CAPS
75.0000 mg | ORAL_CAPSULE | Freq: Two times a day (BID) | ORAL | 0 refills | Status: AC
Start: 2024-01-09 — End: 2024-01-14

## 2024-01-09 NOTE — Patient Instructions (Signed)
 Andrew Castillo, thank you for joining Hyla Maillard, PA-C for today's virtual visit.  While this provider is not your primary care provider (PCP), if your PCP is located in our provider database this encounter information will be shared with them immediately following your visit.   A [facility] MyChart account gives you access to today's visit and all your visits, tests, and labs performed at [facility] " click here if you don't have a [facility] MyChart account or go to [email or web address]  Consent: (Patient) Andrew Castillo provided verbal consent for this virtual visit at the beginning of the encounter.  Current Medications:  Current Outpatient Medications:    oseltamivir  (TAMIFLU ) 75 MG capsule, Take 1 capsule (75 mg total) by mouth 2 (two) times daily for 5 days., Disp: 10 capsule, Rfl: 0   varenicline  (CHANTIX ) 0.5 MG tablet, Take 1 tablet by mouth daily for 3 days. Then increase to 1 tablet twice daily for 5 days. Then increase to 2 tablets twice daily., Disp: 120 tablet, Rfl: 0   baclofen  (LIORESAL ) 10 MG tablet, Take 1 tablet (10 mg total) by mouth 3 (three) times daily., Disp: 90 tablet, Rfl: 1   buPROPion  ER (WELLBUTRIN  SR) 100 MG 12 hr tablet, Take 1 tablet (100 mg total) by mouth 2 (two) times daily., Disp: 60 tablet, Rfl: 2   cloNIDine  (CATAPRES  - DOSED IN MG/24 HR) 0.2 mg/24hr patch, Place 1 patch (0.2 mg total) onto the skin once a week., Disp: 4 patch, Rfl: 1   famotidine  (PEPCID ) 40 MG tablet, Take 1 tablet (40 mg total) by mouth at bedtime., Disp: 90 tablet, Rfl: 3   naltrexone  (DEPADE) 50 MG tablet, Take 1 tablet (50 mg total) by mouth daily., Disp: 90 tablet, Rfl: 0   omeprazole  (PRILOSEC) 40 MG capsule, Take 1 capsule (40 mg total) by mouth 2 (two) times daily before a meal. 30 mins before breakfast and supper, Disp: 180 capsule, Rfl: 3   ondansetron  (ZOFRAN ) 8 MG tablet, Take 1 tablet (8 mg total) by mouth every 8 (eight) hours as needed for  nausea or vomiting., Disp: 30 tablet, Rfl: 2   traZODone  (DESYREL ) 50 MG tablet, Take 1 tablet (50 mg total) by mouth at bedtime as needed and may repeat dose one time if needed for sleep., Disp: 60 tablet, Rfl: 0   Medications ordered in this encounter:  Meds ordered this encounter  Medications   oseltamivir  (TAMIFLU ) 75 MG capsule    Sig: Take 1 capsule (75 mg total) by mouth 2 (two) times daily for 5 days.    Dispense:  10 capsule    Refill:  0    Supervising Provider:   LAMPTEY, PHILIP O [[id number]]   varenicline  (CHANTIX ) 0.5 MG tablet    Sig: Take 1 tablet by mouth daily for 3 days. Then increase to 1 tablet twice daily for 5 days. Then increase to 2 tablets twice daily.    Dispense:  120 tablet    Refill:  0    Supervising Provider:   Corine Dice [[id number]]     *If you need refills on other medications prior to your next appointment, please contact your pharmacy*  Follow-Up: Call back or seek an in-person evaluation if the symptoms worsen or if the condition fails to improve as anticipated.  [facility] [phone]  Other Instructions Start the Chantix  as directed.  Continue follow-up with your specialists. We will follow-up in 2 weeks as scheduled.   Please  keep well-hydrated and try to get plenty of rest. If you have a humidifier, place it in the bedroom and run it at night. Start a saline nasal rinse for nasal congestion. You can consider use of a nasal steroid spray like Flonase  or Nasacort OTC. You can alternate between Tylenol  and Ibuprofen  if needed for fever, body aches, headache and/or throat pain. Salt water-gargles and chloraseptic spray can be very beneficial for sore throat. Mucinex-DM for congestion or cough. Please take all prescribed medications as directed.  Remain out of work until [employer] for 24 hours without a fever-reducing medication, and you are feeling better.  You should mask until symptoms are resolved.  If anything worsens  despite treatment, you need to be evaluated in-person. Please do not delay care.  Influenza, Adult Influenza is also called "the flu." It is an infection in the lungs, nose, and throat (respiratory tract). It spreads easily from person to person (is contagious). The flu causes symptoms that are like a cold, along with high fever and body aches. What are the causes? This condition is caused by the influenza virus. You can get the virus by: Breathing in droplets that are in the air after a person infected with the flu coughed or sneezed. Touching something that has the virus on it and then touching your mouth, nose, or eyes. What increases the risk? Certain things may make you more likely to get the flu. These include: Not washing your hands often. Having close contact with many people during cold and flu season. Touching your mouth, eyes, or nose without first washing your hands. Not getting a flu shot every year. You may have a higher risk for the flu, and serious problems, such as a lung infection (pneumonia), if you: Are older than 65. Are pregnant. Have a weakened disease-fighting system (immune system) because of a disease or because you are taking certain medicines. Have a long-term (chronic) condition, such as: Heart, kidney, or lung disease. Diabetes. Asthma. Have a liver disorder. Are very overweight (morbidly obese). Have anemia. What are the signs or symptoms? Symptoms usually begin suddenly and last 4-14 days. They may include: Fever and chills. Headaches, body aches, or muscle aches. Sore throat. Cough. Runny or stuffy (congested) nose. Feeling discomfort in your chest. Not wanting to eat as much as normal. Feeling weak or tired. Feeling dizzy. Feeling sick to your stomach or throwing up. How is this treated? If the flu is found early, you can be treated with antiviral medicine. This can help to reduce how bad the illness is and how long it lasts. This may be given  by mouth or through an IV tube. Taking care of yourself at home can help your symptoms get better. Your doctor may want you to: Take over-the-counter medicines. Drink plenty of fluids. The flu often goes away on its own. If you have very bad symptoms or other problems, you may be treated in a hospital. Follow these instructions at home:     Activity Rest as needed. Get plenty of sleep. Stay home from work or school as told by your doctor. Do not leave home until you do not have a fever for 24 hours without taking medicine. Leave home only to go to your doctor. Eating and drinking Take an ORS (oral rehydration solution). This is a drink that is sold at pharmacies and stores. Drink enough fluid to keep your pee pale yellow. Drink clear fluids in small amounts as you are able. Clear fluids include: Water.  Ice chips. Fruit juice mixed with water. Low-calorie sports drinks. Eat bland foods that are easy to digest. Eat small amounts as you are able. These foods include: Bananas. Applesauce. Rice. Lean meats. Toast. Crackers. Do not eat or drink: Fluids that have a lot of sugar or caffeine. Alcohol . Spicy or fatty foods. General instructions Take over-the-counter and prescription medicines only as told by your doctor. Use a cool mist humidifier to add moisture to the air in your home. This can make it easier for you to breathe. When using a cool mist humidifier, clean it daily. Empty water and replace with clean water. Cover your mouth and nose when you cough or sneeze. Wash your hands with soap and water often and for at least 20 seconds. This is also important after you cough or sneeze. If you cannot use soap and water, use alcohol -based hand sanitizer. Keep all follow-up visits. How is this prevented?  Get a flu shot every year. You may get the flu shot in late summer, fall, or winter. Ask your doctor when you should get your flu shot. Avoid contact with people who are sick  during fall and winter. This is cold and flu season. Contact a doctor if: You get new symptoms. You have: Chest pain. Watery poop (diarrhea). A fever. Your cough gets worse. You start to have more mucus. You feel sick to your stomach. You throw up. Get help right away if you: Have shortness of breath. Have trouble breathing. Have skin or nails that turn a bluish color. Have very bad pain or stiffness in your neck. Get a sudden headache. Get sudden pain in your face or ear. Cannot eat or drink without throwing up. These symptoms may represent a serious problem that is an emergency. Get medical help right away. Call your local emergency services (911 in the [place] Do not wait to see if the symptoms will go away. Do not drive yourself to the hospital. Summary Influenza is also called "the flu." It is an infection in the lungs, nose, and throat. It spreads easily from person to person. Take over-the-counter and prescription medicines only as told by your doctor. Getting a flu shot every year is the best way to not get the flu. This information is not intended to replace advice given to you by your health care provider. Make sure you discuss any questions you have with your health care provider. Document Revised: 04/22/2020 Document Reviewed: 04/22/2020 Elsevier Patient Education  2023 Elsevier Inc.      If you have been instructed to have an in-person evaluation today at a local Urgent Care facility, please use the link below. It will take you to a list of all of our available [facility], including address, phone number and hours of operation. Please do not delay care.  [facility]  If you or a family member do not have a primary care provider, use the link below to schedule a visit and establish care. When you choose a [facility] primary care physician or advanced practice provider, you gain a long-term partner in health. Find a Primary Care  Provider  Learn more about [facility]'s in-office and virtual care options:  - Get Care Now

## 2024-01-09 NOTE — Telephone Encounter (Signed)
 In CDIOP Rx from there

## 2024-01-09 NOTE — Progress Notes (Signed)
 Virtual Visit Consent   Andrew Castillo, you are scheduled for a virtual visit with a [facility] provider today. Just as with appointments in the office, your consent must be obtained to participate. Your consent will be active for this visit and any virtual visit you may have with one of our providers in the next 365 days. If you have a MyChart account, a copy of this consent can be sent to you electronically.  As this is a virtual visit, video technology does not allow for your provider to perform a traditional examination. This may limit your provider's ability to fully assess your condition. If your provider identifies any concerns that need to be evaluated in person or the need to arrange testing (such as labs, EKG, etc.), we will make arrangements to do so. Although advances in technology are sophisticated, we cannot ensure that it will always work on either your end or our end. If the connection with a video visit is poor, the visit may have to be switched to a telephone visit. With either a video or telephone visit, we are not always able to ensure that we have a secure connection.  By engaging in this virtual visit, you consent to the provision of healthcare and authorize for your insurance to be billed (if applicable) for the services provided during this visit. Depending on your insurance coverage, you may receive a charge related to this service.  I need to obtain your verbal consent now. Are you willing to proceed with your visit today? Jacere L Delamar has provided verbal consent on 01/09/2024 for a virtual visit (video or telephone). Hyla Maillard, [place]  Date: 01/09/2024 10:16 AM  Virtual Visit via Video Note   I, Hyla Maillard, connected with  DVAUGHN FICKLE  ([id number], [birth year]) on 01/09/24 at 10:00 AM EDT by a video-enabled telemedicine application and verified that I am speaking with the correct person using two identifiers.  Location: Patient: Virtual Visit  Location Patient: Home Provider: Virtual Visit Location Provider: Home Office   I discussed the limitations of evaluation and management by telemedicine and the availability of in person appointments. The patient expressed understanding and agreed to proceed.    History of Present Illness: Ranny is presenting today for follow-up regarding smoking cessation/tobacco treatment. At last visit was started on Nicoderm 14 mg patches. Notes he was not able to start as insurance would not cover and he could not afford out of pocket. He does still note complete cessation of cigarettes but still using his vape. In regards to other substance abuse/use he is still successfully remaining abstinent, with stable mood. Is following regularly with his [facility] specialists.  Patient also noting 2 days of body aches, chills, nasal congestion with fever. Denies chest pain or SOB. Daughter is also sick with similar symptoms. No known exposure to COVID-19.  Withdrawal Symptoms: Identified withdrawal symptoms: None.  Observations/Objective: Patient is well-developed, well-nourished in no acute distress.  Resting comfortably at home.  Head is normocephalic, atraumatic.  No labored breathing. Speech is clear and coherent with logical content.  Patient is alert and oriented at baseline.   Assessment and Plan: 1. Flu-like symptoms (Primary) Negative COVID. Classic influenza symptoms. Supportive measures, OTC medications and Vitamin recommendations reviewed. Will start Tamiflu  per orders. Quarantine reviewed with patient.   - oseltamivir  (TAMIFLU ) 75 MG capsule; Take 1 capsule (75 mg total) by mouth 2 (two) times daily for 5 days.  Dispense: 10 capsule; Refill: 0  2. Vapes nicotine   containing substance 3. Nicotine  dependence, cigarettes, uncomplicated - Patient is currently at the following State of Change: Action -- involved in a quit attempt - Comorbid conditions identified: Polysubstance abuse -- in remission -  Reviewed impacts of smoking on patient's health. - Unable to get NRT due to cost. Discussed options including [facility] and [place] quitline versus alternative therapy. He would like to start script for Chantix . - I have prescribed Chantix  (Varenicline ) 0.5 mg tablets. The patient is to start these one week before their selected quit date. Patient is to take after eating, along with a full glass of water. To start, take 1 tablet by mouth daily for 3 days. On Day 4, increase to 1 tablet twice daily for 4 days. Then increase to two tablets twice daily for the rest of the 12-week course of medication. - Other resources including the [employer] and Department of Health and [employer] -- have been included in the patient's written instructions and sent to their MyChart. - Plan for follow-up in 2 weeks.  Follow Up Instructions: I discussed the assessment and treatment plan with the patient. The patient was provided an opportunity to ask questions and all were answered. The patient agreed with the plan and demonstrated an understanding of the instructions.  A copy of instructions were sent to the patient via MyChart unless otherwise noted below.   Time:  I have spent 25 minutes with the patient via telehealth technology in tobacco cessation counseling.    Hyla Maillard, PA-C

## 2024-01-10 ENCOUNTER — Other Ambulatory Visit (HOSPITAL_COMMUNITY): Payer: Self-pay | Admitting: Medical

## 2024-01-10 ENCOUNTER — Ambulatory Visit (INDEPENDENT_AMBULATORY_CARE_PROVIDER_SITE_OTHER): Admitting: Licensed Clinical Social Worker

## 2024-01-10 DIAGNOSIS — F1920 Other psychoactive substance dependence, uncomplicated: Secondary | ICD-10-CM

## 2024-01-10 DIAGNOSIS — F1994 Other psychoactive substance use, unspecified with psychoactive substance-induced mood disorder: Secondary | ICD-10-CM

## 2024-01-10 DIAGNOSIS — F4312 Post-traumatic stress disorder, chronic: Secondary | ICD-10-CM

## 2024-01-10 MED ORDER — BACLOFEN 10 MG PO TABS: 10.0000 mg | ORAL_TABLET | Freq: Three times a day (TID) | ORAL | 2 refills | Status: AC

## 2024-01-10 NOTE — Progress Notes (Signed)
 Patient ID: Andrew Castillo, male   DOB: 12/09/1995, 28 y.o.   MRN: [id number] Pt reports out of Baclofen -? Should he stop Group and Counselors encourage continuation Order sent

## 2024-01-10 NOTE — Progress Notes (Addendum)
 Daily Group Progress Note   Program: [facility]     Group Time: 9 a.m. to 12 p.m.      Type of Therapy: Process and Psychoeducational    Topic: The therapist checks in with group members, assesses for SI/HI/psychosis and overall level of functioning. The therapist inquires about sobriety date and number of community support meetings attended since last session.    The therapist answers group members' questions about anti-depressants and anti-anxiety medications and focuses on the importance of sleep. The therapist educates group members concerning time management skills such as breaking bigger goals into smaller goals, only doing the top one or two things on a person's to do list, and the Premack Principle to stop procrastination. The therapist informs group members that they have to work at least as hard to get sober as they did to get high and that recovery involves having to occasionally do some scary things and get out of one's comfort zone. The therapist mentions the old timers' suggestion of eating a piece of candy in response to a craving to use.   Summary: Andrew Castillo presents rating depression and anxiety both as a 4. He says that when he exerts himself when exercising that he feels like he is going to pass out noting that this did not start until he began the Wellbutrin  so will address this with the PA-C.  He continues to not have a Marketing executive and has not attended meetings as he has been sick this week. He tried some virtual meetings but kept falling asleep. He describes his mood as "tired" and "irritated." He continues to have issues with his fiance over the move admitting to thoughts of using and having using dreams. He opts to renew his Baclofen  prescription today with some encouragement from this therapist and the therapist recommends [place] asking his fiance to come in for another meeting to work through this issue. He receives a lot of feedback from other group members. He did not do his  homework of asking a couple of guys with Sponsors about their experience with Sponsors and is confronted about having procrastinated in getting one and making a lot of excuses.   He says that he is still trying to work through the burdening other people thing and says that he has been "rejected" by two people he liked and asked previously to be his Marketing executive. He also admits that he does not like people.    Progress Towards Goals: Andrew Castillo reports no change in his sobriety.   UDS collected: No Results: Yes, negative for drugs and alcohol    AA/NA attended?:  No  Sponsor?:  No   Will Hare, [place], [place], [facility], LCAS 01/10/2024

## 2024-01-13 ENCOUNTER — Ambulatory Visit (INDEPENDENT_AMBULATORY_CARE_PROVIDER_SITE_OTHER)

## 2024-01-13 ENCOUNTER — Other Ambulatory Visit (HOSPITAL_COMMUNITY): Payer: Self-pay | Admitting: Medical

## 2024-01-13 DIAGNOSIS — F1994 Other psychoactive substance use, unspecified with psychoactive substance-induced mood disorder: Secondary | ICD-10-CM

## 2024-01-13 DIAGNOSIS — F1920 Other psychoactive substance dependence, uncomplicated: Secondary | ICD-10-CM

## 2024-01-13 MED ORDER — TRAZODONE HCL 50 MG PO TABS: 50.0000 mg | ORAL_TABLET | Freq: Every evening | ORAL | 2 refills | Status: AC | PRN

## 2024-01-13 NOTE — Progress Notes (Addendum)
 Daily Group Progress Note   Program: [facility] IOP     Group Time: 9 a.m. to 12 p.m.      Type of Therapy: Process and Psychoeducational    Topic: The therapists checks in with group members, assesses for SI/HI/psychosis and overall level of functioning. The therapists inquires about sobriety date and number of community support meetings attended since last session.    The therapists shows part of the video of The Brain and Recovery: An Update on the Neuroscience of Recovery by Ariel Kussmaul, MD. Therapists shows stops the video from time to time to explain the content and ask if the group members have any questions.   Progress Towards Goals: Andrew Castillo presents rating his depression as a "3" and his anxiety as a "4".  Andrew Castillo reports no change in his sobriety. He says he has been attending meetings. He said he would get a sponsor today. Andrew Castillo says he is attending two different meetings. Therapists suggest he attend more meetings if he cannot find a sponsor.  Andrew Castillo describes his emotions as "proud and happy".  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 He says they did not win, but he was proud of them because they had never made it to the championship game.  Andrew Castillo says  he had a talk with his finance about why she did not want to move. Then he told her she was not helping his sobriety date. Andrew Castillo also says his finance does "petty stuff". Fiance finally told him she agreed to move and he has everything lined up to move on Jan 16, 2024.  Andrew Castillo acknowledges he understands and agrees with the part of the video that was shown today.   UDS collected: Yes Results:None   AA/NA attended?:  Yes  Sponsor?:  No  [name], MS, LMFT, LCAS  [address] [place], [place], [facility], LCAS 01/13/2024

## 2024-01-14 ENCOUNTER — Telehealth (HOSPITAL_COMMUNITY): Payer: Self-pay

## 2024-01-14 NOTE — Telephone Encounter (Signed)
 Hello,   Pt is requesting for a refill of trazadone to be sent to on file pharmacy.     Last filled was 12-02-2023

## 2024-01-15 ENCOUNTER — Ambulatory Visit (INDEPENDENT_AMBULATORY_CARE_PROVIDER_SITE_OTHER): Admitting: Licensed Clinical Social Worker

## 2024-01-15 DIAGNOSIS — F4312 Post-traumatic stress disorder, chronic: Secondary | ICD-10-CM

## 2024-01-15 DIAGNOSIS — F1920 Other psychoactive substance dependence, uncomplicated: Secondary | ICD-10-CM | POA: Diagnosis not present

## 2024-01-15 DIAGNOSIS — F17210 Nicotine dependence, cigarettes, uncomplicated: Secondary | ICD-10-CM

## 2024-01-15 DIAGNOSIS — F1994 Other psychoactive substance use, unspecified with psychoactive substance-induced mood disorder: Secondary | ICD-10-CM

## 2024-01-15 NOTE — Progress Notes (Signed)
 Daily Group Progress Note   Program: [facility] IOP     Group Time: 9 a.m. to 12 p.m.      Type of Therapy: Process and Psychoeducational    Topic: The therapist checks in with group members, assesses for SI/HI/psychosis and overall level of functioning. The therapist inquires about sobriety date and number of community support meetings attended since last session.    The therapist introduces a new group member. He continues to show the video, "new perspectives on addiction recovery." He discusses impairment in the prefrontal cortexes of persons and active addiction and early recovery. He emphasizes the importance of avoiding triggers in relation to this impairment. He answers questions concerning psychotropic medications such as antidepressants and mood stabilizers. He again talks about addiction as a disease that impacts the entire family and encourages group members to take advantage of the family therapy offered through the [facility] program. The therapist begins to discuss the need to address issues centering on grief and toxic shame if a person is to begin to develop interpersonal effectiveness skills.   Summary: Waris presents today rating his depression and anxiety both as a 3.  He says that he finally branched out and attended a different meeting and as a result of this has a meeting with a guy tomorrow who will likely be his new sponsor.  He describes his mood as "hopeful" and "tired."  He says that his fiance now wants to prolong their move to the new house until July.  Although he was not happy about this, he says that he compromised and agreed to the new date.  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  Group Time: 12:15 p.m. to 12:45 p.m.   Type of Therapy: Individual  Summary: The therapist meets individually with Nishal who explains that he wanted this  meeting to talk about some sexual problems that he has been having for the past month.  He says that that up until a week ago that he was uncharacteristically finishing early when having sex; however, he says that he has been having some difficulty obtaining an erection for the past week.  The therapist reviews his medications and suggest that it may be possible that the Chantix  that he started a week ago could be causing the erectile dysfunction.  The therapist encourages him to talk to his primary care doctor about this issue at his upcoming appointment so that she can review his medications further.  After discussing this issue at greater length, it becomes apparent that he has issue with wanting to finish early is likely due to the ongoing disagreements that he and his fiance have been having about the move.  He says that she will approach him to have sex adding that she likes make up sex.  He will not particularly be in the mood but will comply nonetheless.  He says that he used to be able to still have sex with her even when upset; however, the therapist informed him that he was likely able to do this as he was using substances at the time which not his emotions.  Lorinzo says that he is going to talk to his fiance about this and is going to get out of the pattern of consenting to having make-up sex when he does not want to but to focus on having sex when they are communicating and getting along well.  He says that he will invite her to come in for a session if needed.  Progress Towards Goals: Ashad reports no change in his sobriety date.   UDS collected: No Results: No   AA/NA attended?:  Yes  Sponsor?:  No   Will Hare, [place], [place], [facility], LCAS 01/15/2024

## 2024-01-17 ENCOUNTER — Ambulatory Visit (INDEPENDENT_AMBULATORY_CARE_PROVIDER_SITE_OTHER): Payer: MEDICAID

## 2024-01-17 DIAGNOSIS — F4312 Post-traumatic stress disorder, chronic: Secondary | ICD-10-CM

## 2024-01-17 DIAGNOSIS — F1920 Other psychoactive substance dependence, uncomplicated: Secondary | ICD-10-CM | POA: Diagnosis not present

## 2024-01-17 DIAGNOSIS — F1994 Other psychoactive substance use, unspecified with psychoactive substance-induced mood disorder: Secondary | ICD-10-CM

## 2024-01-17 NOTE — Progress Notes (Signed)
  Daily Group Progress Note   Program: [facility] IOP     Group Time: 9 a.m. to 12 p.m.      Type of Therapy: Process and Psychoeducational    Topic: The therapist checks in with group members, assesses for SI/HI/psychosis and overall level of functioning. The therapist inquires about sobriety date and number of community support meetings attended since last session.   The therapist introduce "Managing Shame and Guilt" as it relates to addiction and asks group members to indicate if they relate to a particular shameful belief and to identify where this belief comes from.     Summary: Andrew Castillo presents today rating his depression as a "2" and his anxiety as a "4".  He reports the same sobriety date.  He says he is attending meetings. He does not have a sponsor.  Andrew Castillo identifies his emotions as "tired" and "hopeful". He says he cooked last night and he and his family enjoyed the meal.  When discussing shame and guilt, Andrew Castillo says he now sees addiction as a disease and shared with his peers how he came to view it as so.   Progress Towards Goals: Andrew Castillo reports no change in his sobriety date.   UDS collected: No Results: No   AA/NA attended?:  Yes  Sponsor?:  No  Andrew Gola, MS, LMFT, LCAS [address] [place], [place], [facility], LCAS 01-16-2024

## 2024-01-18 ENCOUNTER — Other Ambulatory Visit: Payer: Self-pay | Admitting: Family Medicine

## 2024-01-18 DIAGNOSIS — I159 Secondary hypertension, unspecified: Secondary | ICD-10-CM

## 2024-01-20 ENCOUNTER — Ambulatory Visit (HOSPITAL_COMMUNITY)

## 2024-01-20 ENCOUNTER — Telehealth (HOSPITAL_COMMUNITY): Payer: Self-pay | Admitting: Licensed Clinical Social Worker

## 2024-01-20 NOTE — Telephone Encounter (Signed)
 The therapist attempts to reach [facility] leaving a HIPAA-compliant voicemail.  Melynda Stagger, MA, LCSW, [facility], LCAS 01/20/2024

## 2024-01-22 ENCOUNTER — Ambulatory Visit (INDEPENDENT_AMBULATORY_CARE_PROVIDER_SITE_OTHER): Payer: MEDICAID | Admitting: Licensed Clinical Social Worker

## 2024-01-22 DIAGNOSIS — F1920 Other psychoactive substance dependence, uncomplicated: Secondary | ICD-10-CM | POA: Diagnosis not present

## 2024-01-22 DIAGNOSIS — F4312 Post-traumatic stress disorder, chronic: Secondary | ICD-10-CM

## 2024-01-22 DIAGNOSIS — F17210 Nicotine dependence, cigarettes, uncomplicated: Secondary | ICD-10-CM

## 2024-01-22 NOTE — Progress Notes (Signed)
 Daily Group Progress Note   Program: [facility] IOP     Group Time: 9 a.m. to 12 p.m.      Type of Therapy: Process and Psychoeducational    Topic: The therapists check in with group members, assess for SI/HI/psychosis and overall level of functioning. The therapists inquire about sobriety date and number of community support meetings attended since last session.   The therapists focus on dealing with sexuality when sober, building sober supports, and overcoming toxic shame and guilt. The therapists observe that many group members were brave today in opening up and have take some big steps in their recovery.       Summary: Andrew Castillo presents rating his depression as a 2 and his anxiety is a 4.  He says that he has the same sobriety date and that he now has a sponsor.  He says that his sponsor is having him read that doctor's opinion and completing a gratitude list and a fear list each day.  Additionally, he says that he now has a home group.  Andrew Castillo describes his mood as "accomplish" and "proud."  He says that he and his fiance are no longer "bashing heads."  Also, he says that he is in the process of buying a new car and that he is hoping to get a job with [employer] saying that he now knows that he will be able to pass the drug test.  Andrew Castillo opens up to the group about his recent sexual problems and discloses his fears about his fiance cheating on him because of this.  He admits that these fears are not rational and that talking about them only serves to upset his fiance.  As he mentions that he has Chantix  might be causing some of his recent sexual problems, another group member and the therapist both recommend that he talks to his primary care doctor about this.  He admits that he likely should have messaged his doctor but figured that he would wait until his scheduled appointment.  Additionally, it is pointed out that his fears of his fiance cheating on him if he is unable to satisfy  her is only serving to increase his anxiety and thus impair his sexual functioning.   He mentions that he listens to the radio show "to catch a cheater" each morning on the radio and is encouraged to stop listening to this. Andrew Castillo says that his biggest takeaway from today's group is that he is becoming more and more comfortable with sharing. He alludes to being on the "pink cloud" after finally getting a Marketing executive.    Progress Towards Goals: Andrew Castillo reports no change in his sobriety.   UDS collected: Yes Results: No   AA/NA attended?:  Yes  Sponsor?:  Yes   Will Hare, MA, LCSW, [facility], LCAS [name], LMFT, LCAS 01/22/2024

## 2024-01-23 ENCOUNTER — Encounter: Payer: Self-pay | Admitting: Family Medicine

## 2024-01-23 ENCOUNTER — Other Ambulatory Visit: Payer: Self-pay | Admitting: Family Medicine

## 2024-01-23 ENCOUNTER — Ambulatory Visit: Payer: MEDICAID | Admitting: Family Medicine

## 2024-01-23 VITALS — BP 130/80 | HR 92 | Temp 98.1°F | Ht 71.0 in | Wt 243.0 lb

## 2024-01-23 DIAGNOSIS — I159 Secondary hypertension, unspecified: Secondary | ICD-10-CM | POA: Diagnosis not present

## 2024-01-23 DIAGNOSIS — F1921 Other psychoactive substance dependence, in remission: Secondary | ICD-10-CM | POA: Insufficient documentation

## 2024-01-23 DIAGNOSIS — F419 Anxiety disorder, unspecified: Secondary | ICD-10-CM

## 2024-01-23 DIAGNOSIS — N529 Male erectile dysfunction, unspecified: Secondary | ICD-10-CM | POA: Diagnosis not present

## 2024-01-23 DIAGNOSIS — E66811 Obesity, class 1: Secondary | ICD-10-CM

## 2024-01-23 DIAGNOSIS — F32A Depression, unspecified: Secondary | ICD-10-CM | POA: Diagnosis not present

## 2024-01-23 DIAGNOSIS — Z87891 Personal history of nicotine dependence: Secondary | ICD-10-CM

## 2024-01-23 LAB — CBC WITH DIFFERENTIAL/PLATELET
Basophils Absolute: 0 10*3/uL (ref 0.0–0.1)
Basophils Relative: 0.3 % (ref 0.0–3.0)
Eosinophils Absolute: 0.1 10*3/uL (ref 0.0–0.7)
Eosinophils Relative: 0.7 % (ref 0.0–5.0)
HCT: 46.1 % (ref 39.0–52.0)
Hemoglobin: 15 g/dL (ref 13.0–17.0)
Lymphocytes Relative: 20.1 % (ref 12.0–46.0)
Lymphs Abs: 2.1 10*3/uL (ref 0.7–4.0)
MCHC: 32.5 g/dL (ref 30.0–36.0)
MCV: 87.7 fl (ref 78.0–100.0)
Monocytes Absolute: 0.7 10*3/uL (ref 0.1–1.0)
Monocytes Relative: 6.4 % (ref 3.0–12.0)
Neutro Abs: 7.5 10*3/uL (ref 1.4–7.7)
Neutrophils Relative %: 72.5 % (ref 43.0–77.0)
Platelets: 342 10*3/uL (ref 150.0–400.0)
RBC: 5.26 Mil/uL (ref 4.22–5.81)
RDW: 14.2 % (ref 11.5–15.5)
WBC: 10.3 10*3/uL (ref 4.0–10.5)

## 2024-01-23 LAB — COMPREHENSIVE METABOLIC PANEL WITH GFR
ALT: 26 U/L (ref 0–53)
AST: 19 U/L (ref 0–37)
Albumin: 4.4 g/dL (ref 3.5–5.2)
Alkaline Phosphatase: 81 U/L (ref 39–117)
BUN: 10 mg/dL (ref 6–23)
CO2: 26 meq/L (ref 19–32)
Calcium: 9.4 mg/dL (ref 8.4–10.5)
Chloride: 103 meq/L (ref 96–112)
Creatinine, Ser: 0.96 mg/dL (ref 0.40–1.50)
GFR: 108.51 mL/min (ref >60.00)
Glucose, Bld: 89 mg/dL (ref 70–99)
Potassium: 4 meq/L (ref 3.5–5.1)
Sodium: 137 meq/L (ref 135–145)
Total Bilirubin: 0.5 mg/dL (ref 0.2–1.2)
Total Protein: 7.2 g/dL (ref 6.0–8.3)

## 2024-01-23 LAB — TSH: TSH: 1.06 u[IU]/mL (ref 0.35–5.50)

## 2024-01-23 MED ORDER — CLONIDINE HCL 0.1 MG/24HR TD PTWK: 0.1000 mg | MEDICATED_PATCH | TRANSDERMAL | 0 refills | Status: DC

## 2024-01-23 MED ORDER — SILDENAFIL CITRATE 50 MG PO TABS: 50.0000 mg | ORAL_TABLET | Freq: Every day | ORAL | 0 refills | Status: AC | PRN

## 2024-01-23 MED ORDER — VALSARTAN 80 MG PO TABS: 80.0000 mg | ORAL_TABLET | Freq: Every day | ORAL | 0 refills | Status: AC

## 2024-01-23 NOTE — Progress Notes (Signed)
 Subjective:     Patient ID: Andrew Castillo, male    DOB: September 28, 1995, 28 y.o.   MRN: [id number]  Chief Complaint  Patient presents with   Follow-up    Hypertension BP    HPI   History of Present Illness          Here to follow up on HTN.  Blood pressure has improved on clonidine  0.2 mg transdermal.  Reports the adhesive is causing him to have a rash.   States he is 61 days sober.   Reports that he cut out caffeine and stopped smoking. Using Chantix .    ED - unable to get an erection at times and at other times unable to maintain an erection We feels like one of his medications is causing this.     Health Maintenance Due  Topic Date Due   Hepatitis C Screening  Never done   Pneumococcal Vaccine 108-6 Years old (1 of 2 - PCV) Never done   DTaP/Tdap/Td (6 - Td or Tdap) 04/09/2018   COVID-19 Vaccine (1 - 2024-25 season) Never done    Past Medical History:  Diagnosis Date   Anxiety and depression    Childhood asthma    Cocaine abuse (HCC)    Environmental allergies    GERD (gastroesophageal reflux disease)    Headache    migraines   Panic attacks    Polysubstance abuse (HCC)    Seasonal allergic conjunctivitis    Seizures (HCC)    caused by flexiril 01/2022    Past Surgical History:  Procedure Laterality Date   ESOPHAGOGASTRODUODENOSCOPY     Left Knee Menuiscus     "several surguries on same knee"   NM ESOPHAGEAL REFLUX     Right Shoulder Surgery     TONSILLECTOMY     adenoids   UPPER GI ENDOSCOPY      Family History  Problem Relation Age of Onset   Fibromyalgia Mother    Thyroid  disease Mother        Living   Lupus Mother    Anxiety disorder Mother    Clotting disorder Mother    Crohn's disease Mother    Hypertension Father    Hyperlipidemia Father        Living   Liver disease Father    Anxiety disorder Sister    ADD / ADHD Brother    Seizures Brother    Anxiety disorder Maternal Aunt    Depression Maternal Aunt    Lupus Maternal  Aunt    Diabetes Paternal Aunt    Lupus Paternal Aunt    Fibromyalgia Paternal Aunt    Drug abuse Paternal Uncle    Diabetes Maternal Grandmother    Hypertension Maternal Grandmother    Liver disease Maternal Grandmother    Bone cancer Paternal Grandmother    Heart disease Paternal Grandfather    Diabetes Paternal Grandfather    Migraines Other        Various   Colon cancer Neg Hx    Esophageal cancer Neg Hx    Rectal cancer Neg Hx    Stomach cancer Neg Hx     Social History   Socioeconomic History   Marital status: Single    Spouse name: Not on file   Number of children: 1   Years of education: 12   Highest education level: Some college, no degree  Occupational History   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, minerals  Tobacco Use   Smoking status: Every Day  Current packs/day: 0.50    Average packs/day: 0.5 packs/day for 2.0 years (1.0 ttl pk-yrs)    Types: Cigarettes   Smokeless tobacco: Never   Tobacco comments:    1 pack every 2-3 days  Vaping Use   Vaping status: Never Used  Substance and Sexual Activity   Alcohol  use: Not Currently   Drug use: Yes    Types: Marijuana    Comment: Daily-   Sexual activity: Yes  Other Topics Concern   Not on file  Social History Narrative   Single-engaged, 1 child lives with fianc   Marijuana user Cigarette smoker   no current alcohol  no other drugs   He is an Designer, television/film set   Social Drivers of Health   Financial Resource Strain: High Risk (01/23/2024)   Overall Financial Resource Strain (CARDIA)    Difficulty of Paying Living Expenses: Very hard  Food Insecurity: Food Insecurity Present (01/23/2024)   Hunger Vital Sign    Worried About Running Out of Food in the Last Year: Often true    Ran Out of Food in the Last Year: Often true  Transportation Needs: Unmet Transportation Needs (01/23/2024)   PRAPARE - Transportation    Lack of Transportation (Medical): Yes    Lack of Transportation (Non-Medical): Yes  Physical  Activity: Sufficiently Active (01/23/2024)   Exercise Vital Sign    Days of Exercise per Week: 5 days    Minutes of Exercise per Session: 30 min  Stress: Stress Concern Present (01/23/2024)   [employer] of Occupational Health - Occupational Stress Questionnaire    Feeling of Stress : To some extent  Social Connections: Socially Integrated (01/23/2024)   Social Connection and Isolation Panel [NHANES]    Frequency of Communication with Friends and Family: More than three times a week    Frequency of Social Gatherings with Friends and Family: More than three times a week    Attends Religious Services: More than 4 times per year    Active Member of [employer] or Organizations: Yes    Attends Banker Meetings: More than 4 times per year    Marital Status: Living with partner  Intimate Partner Violence: Not At Risk (12/03/2022)   Humiliation, Afraid, Rape, and Kick questionnaire    Fear of Current or Ex-Partner: No    Emotionally Abused: No    Physically Abused: No    Sexually Abused: No    Outpatient Medications Prior to Visit  Medication Sig Dispense Refill   baclofen  (LIORESAL ) 10 MG tablet Take 1 tablet (10 mg total) by mouth 3 (three) times daily. 90 tablet 2   buPROPion  ER (WELLBUTRIN  SR) 100 MG 12 hr tablet Take 1 tablet (100 mg total) by mouth 2 (two) times daily. 60 tablet 2   famotidine  (PEPCID ) 40 MG tablet Take 1 tablet (40 mg total) by mouth at bedtime. 90 tablet 3   naltrexone  (DEPADE) 50 MG tablet Take 1 tablet (50 mg total) by mouth daily. 90 tablet 0   omeprazole  (PRILOSEC) 40 MG capsule Take 1 capsule (40 mg total) by mouth 2 (two) times daily before a meal. 30 mins before breakfast and supper 180 capsule 3   ondansetron  (ZOFRAN ) 8 MG tablet Take 1 tablet (8 mg total) by mouth every 8 (eight) hours as needed for nausea or vomiting. 30 tablet 2   traZODone  (DESYREL ) 50 MG tablet Take 1 tablet (50 mg total) by mouth at bedtime as needed and may repeat dose one time if  needed  for sleep. 60 tablet 2   varenicline  (CHANTIX ) 0.5 MG tablet Take 1 tablet by mouth daily for 3 days. Then increase to 1 tablet twice daily for 5 days. Then increase to 2 tablets twice daily. 120 tablet 0   cloNIDine  (CATAPRES  - DOSED IN MG/24 HR) 0.2 mg/24hr patch PLACE 1 PATCH (0.2 MG TOTAL) ONTO THE SKIN ONCE A WEEK. 12 patch 1   No facility-administered medications prior to visit.    Allergies  Allergen Reactions   Penicillins Hives    Did it involve swelling of the face/tongue/throat, SOB, or low BP? Unknown Did it involve sudden or severe rash/hives, skin peeling, or any reaction on the inside of your mouth or nose? Unknown Did you need to seek medical attention at a hospital or doctor's office? Unknown When did it last happen?   Pt doesn't remember he said he was a child    If all above answers are "NO", may proceed with cephalosporin use.    Flexeril  [Cyclobenzaprine ]     seizures    Review of Systems  Constitutional:  Negative for chills and fever.  Eyes:  Negative for blurred vision and double vision.  Respiratory:  Negative for shortness of breath.   Cardiovascular:  Negative for chest pain, palpitations and leg swelling.  Gastrointestinal:  Negative for abdominal pain, constipation, diarrhea, nausea and vomiting.  Genitourinary:  Negative for dysuria, frequency and urgency.  Neurological:  Negative for dizziness, focal weakness and headaches.  Psychiatric/Behavioral:  The patient is nervous/anxious.        Objective:     Physical Exam Constitutional:      General: He is not in acute distress.    Appearance: He is not ill-appearing.  Eyes:     Extraocular Movements: Extraocular movements intact.     Conjunctiva/sclera: Conjunctivae normal.  Cardiovascular:     Rate and Rhythm: Normal rate.  Pulmonary:     Effort: Pulmonary effort is normal.  Musculoskeletal:     Cervical back: Normal range of motion and neck supple.  Skin:    General: Skin is warm  and dry.  Neurological:     General: No focal deficit present.     Mental Status: He is alert and oriented to person, place, and time.  Psychiatric:        Mood and Affect: Mood normal.        Behavior: Behavior normal.        Thought Content: Thought content normal.      BP 130/80 (BP Location: Right Arm, Patient Position: Sitting, Cuff Size: Normal)   Pulse 92   Temp 98.1 F (36.7 C) (Oral)   Ht 5\' 11"  (1.803 m)   Wt 243 lb (110.2 kg)   SpO2 98%   BMI 33.89 kg/m  Wt Readings from Last 3 Encounters:  01/23/24 243 lb (110.2 kg)  12/12/23 242 lb (109.8 kg)  12/02/23 230 lb (104.3 kg)       Assessment & Plan:   Problem List Items Addressed This Visit     Anxiety and depression   Erectile dysfunction   Relevant Medications   sildenafil (VIAGRA) 50 MG tablet   Former smoker   Obesity (BMI 30.0-34.9)   Polysubstance dependence in early, early partial, sustained full, or sustained partial remission (HCC)   Secondary hypertension - Primary   Relevant Medications   valsartan (DIOVAN) 80 MG tablet   sildenafil (VIAGRA) 50 MG tablet   cloNIDine  (CATAPRES  - DOSED IN MG/24 HR) 0.1 mg/24hr patch  Other Relevant Orders   CBC with Differential/Platelet (Completed)   Comprehensive metabolic panel with GFR (Completed)   TSH (Completed)   Blood pressure improving however he is having allergic reaction to the adhesive of the transdermal clonidine .  He is also concerned the medication may be causing ED symptoms. He will wean off of the clonidine  by taking 0.1 transdermal x 4 weeks  Switch to valsartan 80 mg daily.  He will Continue monitoring blood pressure at home.  Continue low-sodium diet and limiting caffeine. Check CBC, CMP and TSH. Congratulated him on getting his 60-day chip. He may try sildenafil for ED and let me know how he is doing.  Counseling on potential side effects.  Follow-up in approximately 6 weeks  I have discontinued Aeson L. Speigner's cloNIDine . I am also  having him start on valsartan, sildenafil, and cloNIDine . Additionally, I am having him maintain his omeprazole , famotidine , ondansetron , naltrexone , buPROPion  ER, varenicline , baclofen , and traZODone .  Meds ordered this encounter  Medications   valsartan (DIOVAN) 80 MG tablet    Sig: Take 1 tablet (80 mg total) by mouth daily.    Dispense:  90 tablet    Refill:  0    Supervising Provider:   Bambi Lever A [[id number]]   sildenafil (VIAGRA) 50 MG tablet    Sig: Take 1 tablet (50 mg total) by mouth daily as needed for erectile dysfunction.    Dispense:  10 tablet    Refill:  0    Supervising Provider:   Bambi Lever A [[id number]]   cloNIDine  (CATAPRES  - DOSED IN MG/24 HR) 0.1 mg/24hr patch    Sig: Place 1 patch (0.1 mg total) onto the skin once a week.    Dispense:  4 patch    Refill:  0    Supervising Provider:   Bambi Lever A [[id number]]

## 2024-01-23 NOTE — Patient Instructions (Signed)
 Please go downstairs for labs before you leave.  Stop the clonidine  and start valsartan for your blood pressure.  Continue monitoring your blood pressure closely at home.

## 2024-01-24 ENCOUNTER — Ambulatory Visit (INDEPENDENT_AMBULATORY_CARE_PROVIDER_SITE_OTHER): Payer: MEDICAID

## 2024-01-24 ENCOUNTER — Ambulatory Visit: Payer: MEDICAID | Admitting: Internal Medicine

## 2024-01-24 DIAGNOSIS — F4312 Post-traumatic stress disorder, chronic: Secondary | ICD-10-CM

## 2024-01-24 DIAGNOSIS — F1920 Other psychoactive substance dependence, uncomplicated: Secondary | ICD-10-CM | POA: Diagnosis not present

## 2024-01-24 DIAGNOSIS — F1994 Other psychoactive substance use, unspecified with psychoactive substance-induced mood disorder: Secondary | ICD-10-CM
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
## 2024-01-27 ENCOUNTER — Ambulatory Visit (INDEPENDENT_AMBULATORY_CARE_PROVIDER_SITE_OTHER): Payer: MEDICAID

## 2024-01-27 DIAGNOSIS — F1920 Other psychoactive substance dependence, uncomplicated: Secondary | ICD-10-CM | POA: Diagnosis not present

## 2024-01-27 DIAGNOSIS — F4312 Post-traumatic stress disorder, chronic: Secondary | ICD-10-CM

## 2024-01-27 DIAGNOSIS — F1994 Other psychoactive substance use, unspecified with psychoactive substance-induced mood disorder: Secondary | ICD-10-CM

## 2024-01-27 NOTE — Progress Notes (Signed)
  Daily Group Progress Note   Program: CD IOP     Group Time: 9 a.m. to 11:15 a.m.      Type of Therapy: Process and Psychoeducational    Topic: The therapist checks in with group members, assesses for SI/HI/psychosis and overall level of functioning. The therapist inquires about sobriety date and number of community support meetings attended since last session.   Therapist discusses the following issues and prompts discussion with the group: Blackouts from substance use, role of honesty, forgiving self, presented internal trigger questionnaire from the [facility], as group members to complete it and discussed tole of internal triggers in relapse.   Summary: Andrew Castillo presents today rating his depression as a "2" and his anxiety as a "2".  Andrew Castillo reports the same sobriety date. He says he is attending daily meetings and is working with his sponsor. He says he is developing a good relationship with his sponsor. Andrew Castillo says attending daily meetings is the best thing he is doing for his sobriety.  Andrew Castillo says he is readying [employer] and finds it "very inspirational".  Andrew Castillo identifies his emotions as "happy and accomplished".   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  Andrew Castillo says he surprise his fiance with flowers on Mother's Day.  He says he took the women in his family to eat sushi this weekend and surprised his other and his finance's mother with cakes for Mother's Day.  He says he also played [employer] and had a good time.  Andrew Castillo shares that he is currently reading the book, "Courage to Change" and is finding this helpful for his sobriety  Progress Towards Goals: Andrew Castillo reports no change in his sobriety date.   UDS collected: Yes Results:None   AA/NA attended?:  Yes  Sponsor?:  Yes  [name], MS, LMFT, LCAS  01-27-2024

## 2024-01-28 ENCOUNTER — Telehealth: Payer: MEDICAID | Admitting: Physician Assistant

## 2024-01-28 DIAGNOSIS — F17210 Nicotine dependence, cigarettes, uncomplicated: Secondary | ICD-10-CM

## 2024-01-28 DIAGNOSIS — Z720 Tobacco use: Secondary | ICD-10-CM

## 2024-01-28 DIAGNOSIS — Z716 Tobacco abuse counseling: Secondary | ICD-10-CM

## 2024-01-28 MED ORDER — VARENICLINE TARTRATE 1 MG PO TABS: 1.0000 mg | ORAL_TABLET | Freq: Two times a day (BID) | ORAL | 1 refills | Status: AC

## 2024-01-28 NOTE — Patient Instructions (Signed)
 Andrew Castillo, thank you for joining Hyla Maillard, PA-C for today's virtual visit.  While this provider is not your primary care provider (PCP), if your PCP is located in our provider database this encounter information will be shared with them immediately following your visit.   A [facility] MyChart account gives you access to today's visit and all your visits, tests, and labs performed at [facility] " click here if you don't have a [facility] MyChart account or go to [email or web address]  Consent: (Patient) Andrew Castillo provided verbal consent for this virtual visit at the beginning of the encounter.  Current Medications:  Current Outpatient Medications:    baclofen  (LIORESAL ) 10 MG tablet, Take 1 tablet (10 mg total) by mouth 3 (three) times daily., Disp: 90 tablet, Rfl: 2   buPROPion  ER (WELLBUTRIN  SR) 100 MG 12 hr tablet, Take 1 tablet (100 mg total) by mouth 2 (two) times daily., Disp: 60 tablet, Rfl: 2   cloNIDine  (CATAPRES  - DOSED IN MG/24 HR) 0.1 mg/24hr patch, Place 1 patch (0.1 mg total) onto the skin once a week., Disp: 4 patch, Rfl: 0   famotidine  (PEPCID ) 40 MG tablet, Take 1 tablet (40 mg total) by mouth at bedtime., Disp: 90 tablet, Rfl: 3   naltrexone  (DEPADE) 50 MG tablet, Take 1 tablet (50 mg total) by mouth daily., Disp: 90 tablet, Rfl: 0   omeprazole  (PRILOSEC) 40 MG capsule, Take 1 capsule (40 mg total) by mouth 2 (two) times daily before a meal. 30 mins before breakfast and supper, Disp: 180 capsule, Rfl: 3   ondansetron  (ZOFRAN ) 8 MG tablet, Take 1 tablet (8 mg total) by mouth every 8 (eight) hours as needed for nausea or vomiting., Disp: 30 tablet, Rfl: 2   sildenafil  (VIAGRA ) 50 MG tablet, Take 1 tablet (50 mg total) by mouth daily as needed for erectile dysfunction., Disp: 10 tablet, Rfl: 0   traZODone  (DESYREL ) 50 MG tablet, Take 1 tablet (50 mg total) by mouth at bedtime as needed and may repeat dose one time if needed for sleep.,  Disp: 60 tablet, Rfl: 2   valsartan  (DIOVAN ) 80 MG tablet, Take 1 tablet (80 mg total) by mouth daily., Disp: 90 tablet, Rfl: 0   varenicline  (CHANTIX ) 0.5 MG tablet, Take 1 tablet by mouth daily for 3 days. Then increase to 1 tablet twice daily for 5 days. Then increase to 2 tablets twice daily., Disp: 120 tablet, Rfl: 0   Medications ordered in this encounter:  No orders of the defined types were placed in this encounter.    *If you need refills on other medications prior to your next appointment, please contact your pharmacy*  Follow-Up: Call back or seek an in-person evaluation if the symptoms worsen or if the condition fails to improve as anticipated.  [facility] [phone]  Other Instructions Please continue the Chantix  as directed. Try to avoid social triggers using the recommendations we discussed. It is also okay to talk to your counselor about other strategies for continued tobacco cessation specifically. You are doing fantastic and I am so proud of all the hard work you have done.  Will plan on following up in 1 month as scheduled, but please message me if needed before then.  Take care!   If you have been instructed to have an in-person evaluation today at a local Urgent Care facility, please use the link below. It will take you to a list of all of our available [facility], including address, phone number and hours of operation. Please do not delay care.  [facility]  If you or a family member do not have a primary care provider, use the link below to schedule a visit and establish care. When you choose a [facility] primary care physician or advanced practice provider, you gain a long-term partner in health. Find a Primary Care Provider  Learn more about [facility] in-office and virtual care options: [facility] - Get Care Now

## 2024-01-28 NOTE — Progress Notes (Signed)
 Virtual Visit Consent   [name], you are scheduled for a virtual visit with a [facility] provider today. Just as with appointments in the office, your consent must be obtained to participate. Your consent will be active for this visit and any virtual visit you may have with one of our providers in the next 365 days. If you have a MyChart account, a copy of this consent can be sent to you electronically.  As this is a virtual visit, video technology does not allow for your provider to perform a traditional examination. This may limit your provider's ability to fully assess your condition. If your provider identifies any concerns that need to be evaluated in person or the need to arrange testing (such as labs, EKG, etc.), we will make arrangements to do so. Although advances in technology are sophisticated, we cannot ensure that it will always work on either your end or our end. If the connection with a video visit is poor, the visit may have to be switched to a telephone visit. With either a video or telephone visit, we are not always able to ensure that we have a secure connection.  By engaging in this virtual visit, you consent to the provision of healthcare and authorize for your insurance to be billed (if applicable) for the services provided during this visit. Depending on your insurance coverage, you may receive a charge related to this service.  I need to obtain your verbal consent now. Are you willing to proceed with your visit today? Andrew Castillo has provided verbal consent on 01/28/2024 for a virtual visit (video or telephone). Andrew Castillo, [place]  Date: 01/28/2024 10:25 AM   Virtual Visit via Video Note   I, Andrew Castillo, connected with  Andrew Castillo  ([id number], 01-Sep-1996) on 01/28/24 at 10:00 AM EDT by a video-enabled telemedicine application and verified that I am speaking with the correct person using two identifiers.  Location: Patient: Virtual Visit  Location Patient: Home Provider: Virtual Visit Location Provider: Home Office   I discussed the limitations of evaluation and management by telemedicine and the availability of in person appointments. The patient expressed understanding and agreed to proceed.    History of Present Illness: Andrew Castillo is a 28 y.o. who identifies as a male who was assigned male at birth, and is being seen today for follow-up regarding nicotine  dependence. At last visit due to issues with affordability, treatment was changed to trial of Varenicline . Endorses picking up and starting the medication as directed.  Is now currently on 1 mg twice daily.  Notes tolerating very well overall.  Does note some occasional vivid dreams but is not affecting his ability to rest properly.  Notes his mood has remained stable with the addition of this medication.  Feels like he is doing very great overall.  Is currently 68 days sober from alcohol  and cocaine use.  Continues to follow with his specialists and counselors weekly.  Notes as far as nicotine  use (cigarettes and vape) he has mainly stopped but occasionally will have a slip up and have a puff or 2 of her friends cigarette.  Notes being around certain friends is the main trigger for any recurrent use.  Notes he is eating and hydrating well.  Is currently going in detail with his counselor about the science of addiction and continues to learn new strategies to prevent relapse.  HPI: HPI  Problems:  Patient Active Problem List   Diagnosis Date  Noted   Polysubstance dependence in early, early partial, sustained full, or sustained partial remission (HCC) 01/23/2024   Former smoker 01/23/2024   Obesity (BMI 30.0-34.9) 01/23/2024   Erectile dysfunction 01/23/2024   Secondary hypertension 01/23/2024   Displacement of lumbar intervertebral disc 11/25/2023   Elevated blood pressure reading 11/25/2023   Internal derangement of left knee 04/24/2023   Acute lateral meniscus tear of  left knee 01/07/2023   Marijuana abuse 12/18/2022   Panic attacks 12/07/2022   Hx of seizure disorder 12/03/2022   Cocaine abuse (HCC) 12/03/2022   Seizure disorder (HCC) 05/24/2022   Smoker 06/24/2019   Seizure-like activity (HCC) 04/30/2016   Anxiety and depression 11/21/2015   Gastroesophageal reflux disease with esophagitis 07/15/2015   Inattention 07/15/2015   Allergic rhinitis 11/14/2006   Asthma 11/14/2006   ECZEMA, ATOPIC DERMATITIS 11/14/2006    Allergies:  Allergies  Allergen Reactions   Penicillins Hives    Did it involve swelling of the face/tongue/throat, SOB, or low BP? Unknown Did it involve sudden or severe rash/hives, skin peeling, or any reaction on the inside of your mouth or nose? Unknown Did you need to seek medical attention at a hospital or doctor's office? Unknown When did it last happen?   Pt doesn't remember he said he was a child    If all above answers are "NO", may proceed with cephalosporin use.    Flexeril  [Cyclobenzaprine ]     seizures   Medications:  Current Outpatient Medications:    baclofen  (LIORESAL ) 10 MG tablet, Take 1 tablet (10 mg total) by mouth 3 (three) times daily., Disp: 90 tablet, Rfl: 2   buPROPion  ER (WELLBUTRIN  SR) 100 MG 12 hr tablet, Take 1 tablet (100 mg total) by mouth 2 (two) times daily., Disp: 60 tablet, Rfl: 2   cloNIDine  (CATAPRES  - DOSED IN MG/24 HR) 0.1 mg/24hr patch, Place 1 patch (0.1 mg total) onto the skin once a week., Disp: 4 patch, Rfl: 0   famotidine  (PEPCID ) 40 MG tablet, Take 1 tablet (40 mg total) by mouth at bedtime., Disp: 90 tablet, Rfl: 3   naltrexone  (DEPADE) 50 MG tablet, Take 1 tablet (50 mg total) by mouth daily., Disp: 90 tablet, Rfl: 0   omeprazole  (PRILOSEC) 40 MG capsule, Take 1 capsule (40 mg total) by mouth 2 (two) times daily before a meal. 30 mins before breakfast and supper, Disp: 180 capsule, Rfl: 3   ondansetron  (ZOFRAN ) 8 MG tablet, Take 1 tablet (8 mg total) by mouth every 8 (eight) hours  as needed for nausea or vomiting., Disp: 30 tablet, Rfl: 2   sildenafil  (VIAGRA ) 50 MG tablet, Take 1 tablet (50 mg total) by mouth daily as needed for erectile dysfunction., Disp: 10 tablet, Rfl: 0   traZODone  (DESYREL ) 50 MG tablet, Take 1 tablet (50 mg total) by mouth at bedtime as needed and may repeat dose one time if needed for sleep., Disp: 60 tablet, Rfl: 2   valsartan  (DIOVAN ) 80 MG tablet, Take 1 tablet (80 mg total) by mouth daily., Disp: 90 tablet, Rfl: 0   varenicline  (CHANTIX ) 0.5 MG tablet, Take 1 tablet by mouth daily for 3 days. Then increase to 1 tablet twice daily for 5 days. Then increase to 2 tablets twice daily., Disp: 120 tablet, Rfl: 0  Observations/Objective: Patient is well-developed, well-nourished in no acute distress.  Resting comfortably  at home.  Head is normocephalic, atraumatic.  No labored breathing.  Speech is clear and coherent with logical content.  Patient is alert  and oriented at baseline.   Assessment and Plan: 1. Vapes nicotine  containing substance (Primary)  2. Nicotine  dependence, cigarettes, uncomplicated  Currently in the action phase of tobacco treatment.  Almost at complete cessation, but occasionally having a small relapse.  Continues to remain sober from any other substance use.  Congratulated patient on all his hard efforts and success.  He is to continue regular follow-ups with his specialist and counseling.  Since he is tolerating the Chantix  well overall, we will continue this for up to 12 weeks.  Refill sent in to have on file at the pharmacy.  Discussed ways to avoid relapse with social triggers, including keeping some candy or gum on hand to already have in his mouth when around these friends, so that he is less likely to take a puff/more likely to wait out the craving.  Goal was for complete cessation over the next week.  Plan for follow-up in 1 month.  Sooner if needed.  I spent over 10 minutes in tobacco treatment counseling with the  patient today.  Follow Up Instructions: I discussed the assessment and treatment plan with the patient. The patient was provided an opportunity to ask questions and all were answered. The patient agreed with the plan and demonstrated an understanding of the instructions.  A copy of instructions were sent to the patient via MyChart unless otherwise noted below.   The patient was advised to call back or seek an in-person evaluation if the symptoms worsen or if the condition fails to improve as anticipated.    Andrew Maillard, PA-C

## 2024-01-29 ENCOUNTER — Encounter: Payer: Self-pay | Admitting: Physician Assistant

## 2024-01-29 ENCOUNTER — Telehealth (HOSPITAL_COMMUNITY): Payer: Self-pay | Admitting: Licensed Clinical Social Worker

## 2024-01-29 ENCOUNTER — Ambulatory Visit (HOSPITAL_COMMUNITY)

## 2024-01-29 NOTE — Telephone Encounter (Signed)
 The therapist calls Andrew Castillo verifying his identity via two identifiers. He says that he could not make it in as his mom is sick so he did not have a babysitter. The therapist confirms that [facility] has the therapists' direct contact number and asks that he leave a voicemail if he is unable to make group which he says he will do moving forward.  Melynda Stagger, MA, LCSW, [facility], LCAS 01/29/2024

## 2024-01-31 ENCOUNTER — Telehealth (HOSPITAL_COMMUNITY): Payer: Self-pay | Admitting: Licensed Clinical Social Worker

## 2024-01-31 ENCOUNTER — Ambulatory Visit (HOSPITAL_COMMUNITY): Payer: MEDICAID

## 2024-01-31 NOTE — Telephone Encounter (Signed)
 The therapist attempts to reach [facility] leaving a HIPAA-compliant voicemail.  Melynda Stagger, MA, LCSW, [id number], LCAS 05/162/2025

## 2024-02-03 ENCOUNTER — Ambulatory Visit (HOSPITAL_COMMUNITY)

## 2024-02-03 ENCOUNTER — Telehealth (HOSPITAL_COMMUNITY): Payer: Self-pay

## 2024-02-03 NOTE — Telephone Encounter (Signed)
 Therapist called Annmarie Basket as he missed today's CDIOP group.  Voice mail picks up and therapist leaves a HIPAA compliant message requesting [facility] to return the call.  [name], MS, LMFT, LCAS 02-03-24

## 2024-02-05 ENCOUNTER — Encounter (HOSPITAL_COMMUNITY): Payer: Self-pay | Admitting: Medical

## 2024-02-05 ENCOUNTER — Ambulatory Visit (INDEPENDENT_AMBULATORY_CARE_PROVIDER_SITE_OTHER): Payer: MEDICAID | Admitting: Medical

## 2024-02-05 ENCOUNTER — Encounter (HOSPITAL_COMMUNITY): Payer: Self-pay | Admitting: Licensed Clinical Social Worker

## 2024-02-05 DIAGNOSIS — F1920 Other psychoactive substance dependence, uncomplicated: Secondary | ICD-10-CM | POA: Diagnosis not present

## 2024-02-05 DIAGNOSIS — F1420 Cocaine dependence, uncomplicated: Secondary | ICD-10-CM

## 2024-02-05 DIAGNOSIS — Z91199 Patient's noncompliance with other medical treatment and regimen due to unspecified reason: Secondary | ICD-10-CM

## 2024-02-05 DIAGNOSIS — F17210 Nicotine dependence, cigarettes, uncomplicated: Secondary | ICD-10-CM

## 2024-02-05 DIAGNOSIS — F1020 Alcohol dependence, uncomplicated: Secondary | ICD-10-CM

## 2024-02-05 DIAGNOSIS — Z6372 Alcoholism and drug addiction in family: Secondary | ICD-10-CM

## 2024-02-05 DIAGNOSIS — F12288 Cannabis dependence with other cannabis-induced disorder: Secondary | ICD-10-CM

## 2024-02-05 DIAGNOSIS — F1220 Cannabis dependence, uncomplicated: Secondary | ICD-10-CM

## 2024-02-05 DIAGNOSIS — F4312 Post-traumatic stress disorder, chronic: Secondary | ICD-10-CM

## 2024-02-05 DIAGNOSIS — Z659 Problem related to unspecified psychosocial circumstances: Secondary | ICD-10-CM

## 2024-02-05 DIAGNOSIS — Z5320 Procedure and treatment not carried out because of patient's decision for unspecified reasons: Secondary | ICD-10-CM
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
## 2024-02-07 ENCOUNTER — Ambulatory Visit (HOSPITAL_COMMUNITY)

## 2024-02-12 ENCOUNTER — Ambulatory Visit (HOSPITAL_COMMUNITY)

## 2024-02-14 ENCOUNTER — Ambulatory Visit (HOSPITAL_COMMUNITY)

## 2024-02-17 ENCOUNTER — Ambulatory Visit (HOSPITAL_COMMUNITY)

## 2024-02-19 ENCOUNTER — Ambulatory Visit (HOSPITAL_COMMUNITY)

## 2024-02-21 ENCOUNTER — Ambulatory Visit (HOSPITAL_COMMUNITY)

## 2024-02-25 ENCOUNTER — Telehealth: Admitting: Physician Assistant

## 2024-02-25 NOTE — Progress Notes (Signed)
 The patient no-showed for appointment despite this provider sending direct link, reaching out via phone with no response and waiting for at least 10 minutes from appointment time for patient to join. They will be marked as a NS for this appointment/time.  ? ?[name], PA-C ? ? ? ?

## 2024-03-02 ENCOUNTER — Ambulatory Visit: Payer: MEDICAID | Admitting: Internal Medicine

## 2024-03-05 ENCOUNTER — Encounter: Payer: Self-pay | Admitting: Family Medicine

## 2024-03-05 ENCOUNTER — Ambulatory Visit (INDEPENDENT_AMBULATORY_CARE_PROVIDER_SITE_OTHER): Payer: MEDICAID | Admitting: Family Medicine

## 2024-03-05 VITALS — BP 128/86 | HR 83 | Ht 71.0 in | Wt 238.0 lb

## 2024-03-05 DIAGNOSIS — I159 Secondary hypertension, unspecified: Secondary | ICD-10-CM | POA: Diagnosis not present

## 2024-03-05 NOTE — Progress Notes (Signed)
 Subjective:     Patient ID: Andrew Castillo, male    DOB: [birth year], 28 y.o.   MRN: [id number]  No chief complaint on file.   HPI   History of Present Illness         He is here to follow-up on hypertension.  Weaned off of transdermal clonidine  and is now taking valsartan  80 mg daily.  Reports feeling much better.  No longer having ED. No new concerns.     Health Maintenance Due  Topic Date Due   Hepatitis C Screening  Never done   Pneumococcal Vaccine 14-57 Years old (1 of 2 - PCV) Never done   DTaP/Tdap/Td (6 - Td or Tdap) 04/09/2018   COVID-19 Vaccine (1 - 2024-25 season) Never done    Past Medical History:  Diagnosis Date   Anxiety and depression    Childhood asthma    Cocaine abuse (HCC)    Environmental allergies    GERD (gastroesophageal reflux disease)    Headache    migraines   Panic attacks    Polysubstance abuse (HCC)    Seasonal allergic conjunctivitis    Seizures (HCC)    caused by flexiril 01/2022    Past Surgical History:  Procedure Laterality Date   ESOPHAGOGASTRODUODENOSCOPY     Left Knee Menuiscus     several surguries on same knee   NM ESOPHAGEAL REFLUX     Right Shoulder Surgery     TONSILLECTOMY     adenoids   UPPER GI ENDOSCOPY      Family History  Problem Relation Age of Onset   Fibromyalgia Mother    Thyroid  disease Mother        Living   Lupus Mother    Anxiety disorder Mother    Clotting disorder Mother    Crohn's disease Mother    Hypertension Father    Hyperlipidemia Father        Living   Liver disease Father    Anxiety disorder Sister    ADD / ADHD Brother    Seizures Brother    Anxiety disorder Maternal Aunt    Depression Maternal Aunt    Lupus Maternal Aunt    Diabetes Paternal Aunt    Lupus Paternal Aunt    Fibromyalgia Paternal Aunt    Drug abuse Paternal Uncle    Diabetes Maternal Grandmother    Hypertension Maternal Grandmother    Liver disease Maternal Grandmother    Bone cancer Paternal  Grandmother    Heart disease Paternal Grandfather    Diabetes Paternal Grandfather    Migraines Other        Various   Colon cancer Neg Hx    Esophageal cancer Neg Hx    Rectal cancer Neg Hx    Stomach cancer Neg Hx     Social History   Socioeconomic History   Marital status: Single    Spouse name: Not on file   Number of children: 1   Years of education: 12   Highest education level: Some college, no degree  Occupational History   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, minerals  Tobacco Use   Smoking status: Every Day    Current packs/day: 0.50    Average packs/day: 0.5 packs/day for 2.0 years (1.0 ttl pk-yrs)    Types: Cigarettes   Smokeless tobacco: Never   Tobacco comments:    1 pack every 2-3 days  Vaping Use   Vaping status: Never Used  Substance and Sexual Activity  Alcohol  use: Not Currently   Drug use: Yes    Types: Marijuana    Comment: Daily-   Sexual activity: Yes  Other Topics Concern   Not on file  Social History Narrative   Single-engaged, 1 child lives with fianc   Marijuana user Cigarette smoker   no current alcohol  no other drugs   He is an Designer, television/film set   Social Drivers of Health   Financial Resource Strain: High Risk (03/04/2024)   Overall Financial Resource Strain (CARDIA)    Difficulty of Paying Living Expenses: Very hard  Food Insecurity: Food Insecurity Present (03/04/2024)   Hunger Vital Sign    Worried About Running Out of Food in the Last Year: Often true    Ran Out of Food in the Last Year: Often true  Transportation Needs: Unmet Transportation Needs (03/04/2024)   PRAPARE - Transportation    Lack of Transportation (Medical): Yes    Lack of Transportation (Non-Medical): Yes  Physical Activity: Sufficiently Active (03/04/2024)   Exercise Vital Sign    Days of Exercise per Week: 5 days    Minutes of Exercise per Session: 30 min  Stress: Stress Concern Present (03/04/2024)   [employer] of Occupational Health - Occupational  Stress Questionnaire    Feeling of Stress: Rather much  Social Connections: Socially Integrated (03/04/2024)   Social Connection and Isolation Panel    Frequency of Communication with Friends and Family: More than three times a week    Frequency of Social Gatherings with Friends and Family: More than three times a week    Attends Religious Services: More than 4 times per year    Active Member of [employer] or Organizations: Yes    Attends Banker Meetings: More than 4 times per year    Marital Status: Living with partner  Intimate Partner Violence: Not At Risk (12/03/2022)   Humiliation, Afraid, Rape, and Kick questionnaire    Fear of Current or Ex-Partner: No    Emotionally Abused: No    Physically Abused: No    Sexually Abused: No    Outpatient Medications Prior to Visit  Medication Sig Dispense Refill   baclofen  (LIORESAL ) 10 MG tablet Take 1 tablet (10 mg total) by mouth 3 (three) times daily. 90 tablet 2   buPROPion  ER (WELLBUTRIN  SR) 100 MG 12 hr tablet Take 1 tablet (100 mg total) by mouth 2 (two) times daily. 60 tablet 2   famotidine  (PEPCID ) 40 MG tablet Take 1 tablet (40 mg total) by mouth at bedtime. 90 tablet 3   omeprazole  (PRILOSEC) 40 MG capsule Take 1 capsule (40 mg total) by mouth 2 (two) times daily before a meal. 30 mins before breakfast and supper 180 capsule 3   ondansetron  (ZOFRAN ) 8 MG tablet Take 1 tablet (8 mg total) by mouth every 8 (eight) hours as needed for nausea or vomiting. 30 tablet 2   sildenafil  (VIAGRA ) 50 MG tablet Take 1 tablet (50 mg total) by mouth daily as needed for erectile dysfunction. 10 tablet 0   traZODone  (DESYREL ) 50 MG tablet Take 1 tablet (50 mg total) by mouth at bedtime as needed and may repeat dose one time if needed for sleep. 60 tablet 2   valsartan  (DIOVAN ) 80 MG tablet Take 1 tablet (80 mg total) by mouth daily. 90 tablet 0   varenicline  (CHANTIX ) 1 MG tablet Take 1 tablet (1 mg total) by mouth 2 (two) times daily. 60 tablet  1   cloNIDine  (CATAPRES  - DOSED  IN MG/24 HR) 0.1 mg/24hr patch Place 1 patch (0.1 mg total) onto the skin once a week. 4 patch 0   naltrexone  (DEPADE) 50 MG tablet Take 1 tablet (50 mg total) by mouth daily. 90 tablet 0   No facility-administered medications prior to visit.    Allergies  Allergen Reactions   Penicillins Hives    Did it involve swelling of the face/tongue/throat, SOB, or low BP? Unknown Did it involve sudden or severe rash/hives, skin peeling, or any reaction on the inside of your mouth or nose? Unknown Did you need to seek medical attention at a hospital or doctor's office? Unknown When did it last happen?   Pt doesn't remember he said he was a child    If all above answers are "NO", may proceed with cephalosporin use.    Flexeril  [Cyclobenzaprine ]     seizures    Review of Systems  Constitutional:  Negative for chills, fever and malaise/fatigue.  Eyes:  Negative for blurred vision and double vision.  Respiratory:  Negative for shortness of breath.   Cardiovascular:  Negative for chest pain, palpitations and leg swelling.  Gastrointestinal:  Negative for abdominal pain, constipation, diarrhea, nausea and vomiting.  Genitourinary:  Negative for dysuria, frequency and urgency.  Neurological:  Negative for dizziness, focal weakness and headaches.  Psychiatric/Behavioral:  Negative for depression and substance abuse. The patient is not nervous/anxious.        Objective:    Physical Exam Constitutional:      General: He is not in acute distress.    Appearance: He is not ill-appearing.   Eyes:     Extraocular Movements: Extraocular movements intact.     Conjunctiva/sclera: Conjunctivae normal.    Cardiovascular:     Rate and Rhythm: Normal rate.  Pulmonary:     Effort: Pulmonary effort is normal.   Musculoskeletal:     Cervical back: Normal range of motion and neck supple.   Skin:    General: Skin is warm and dry.   Neurological:     General: No  focal deficit present.     Mental Status: He is alert and oriented to person, place, and time.   Psychiatric:        Mood and Affect: Mood normal.        Behavior: Behavior normal.        Thought Content: Thought content normal.      BP 128/86 (BP Location: Left Arm, Patient Position: Sitting)   Pulse 83   Ht 5' 11 (1.803 m)   Wt 238 lb (108 kg)   SpO2 98%   BMI 33.19 kg/m  Wt Readings from Last 3 Encounters:  03/05/24 238 lb (108 kg)  01/23/24 243 lb (110.2 kg)  12/12/23 242 lb (109.8 kg)       Assessment & Plan:   Problem List Items Addressed This Visit     Secondary hypertension - Primary   ED and other symptoms have improved since being off clonidine . Continue valsartan  80 mg daily.  Continue low-sodium diet and exercise. Reviewed labs from previous visit with patient. He will continue seeing behavioral health for mental health medications. Follow-up here in 3 months fasting or sooner if needed.   I have discontinued Fenton L. Horne's naltrexone  and cloNIDine . I am also having him maintain his omeprazole , famotidine , ondansetron , buPROPion  ER, baclofen , traZODone , valsartan , sildenafil , and varenicline .  No orders of the defined types were placed in this encounter.

## 2024-03-05 NOTE — Patient Instructions (Signed)
 Continue your current medication.  Keep up the good work

## 2024-04-03 ENCOUNTER — Encounter: Payer: Self-pay | Admitting: Advanced Practice Midwife

## 2024-06-05 ENCOUNTER — Ambulatory Visit: Payer: MEDICAID | Admitting: Family Medicine

## 2024-09-25 ENCOUNTER — Ambulatory Visit (HOSPITAL_COMMUNITY)
Admission: EM | Admit: 2024-09-25 | Discharge: 2024-09-25 | Disposition: A | Discharge status: Home / Self Care | Payer: MEDICAID

## 2024-09-25 ENCOUNTER — Ambulatory Visit: Payer: MEDICAID | Admitting: Family Medicine

## 2024-09-25 ENCOUNTER — Encounter: Payer: Self-pay | Admitting: Family Medicine

## 2024-09-25 VITALS — BP 136/92 | HR 110 | Temp 97.6°F | Ht 71.0 in | Wt 248.0 lb

## 2024-09-25 DIAGNOSIS — R0683 Snoring: Secondary | ICD-10-CM

## 2024-09-25 DIAGNOSIS — R45851 Suicidal ideations: Secondary | ICD-10-CM | POA: Diagnosis not present

## 2024-09-25 DIAGNOSIS — G40909 Epilepsy, unspecified, not intractable, without status epilepticus: Secondary | ICD-10-CM

## 2024-09-25 DIAGNOSIS — F332 Major depressive disorder, recurrent severe without psychotic features: Secondary | ICD-10-CM | POA: Diagnosis not present

## 2024-09-25 DIAGNOSIS — F515 Nightmare disorder: Secondary | ICD-10-CM

## 2024-09-25 NOTE — Patient Instructions (Signed)
 [facility]  [phone] [place]

## 2024-09-25 NOTE — Progress Notes (Signed)
 "  Subjective:     Patient ID: Andrew Castillo, male    DOB: 11/02/1995, 29 y.o.   MRN: [id number]  Chief Complaint  Patient presents with   Referral    Wants referral to neurologist, gf told him he stops breathing in his sleep and overall sleep doesn't sound good    HPI  Discussed the use of AI scribe software for clinical note transcription with the patient, who gave verbal consent to proceed.  History of Present Illness Andrew Castillo is a 29 year old male who presents with concerns regarding witnessed apnea while sleeping and possible sleep apnea.  Sleep-disordered breathing - Loud snoring with witnessed apneic episodes during sleep - No prior sleep study performed - Desires evaluation with a sleep study  Seizure disorder - Followed by [facility] - No recent neurology follow-up  Mood disturbance and suicidal ideation - Ongoing depression and anxiety - Positive screening for suicidal thoughts but no intention. States he would not harm himself because he wants to be here for his daughter.  - Worst suicidal thoughts occur when drinking alcohol  - Vivid self-harm dreams - Has had thoughts of shooting himself - Removed access to firearms by giving gun to a friend - No recent visits with counselor or psychiatrist - Strained relationship with fiance contributing to emotional distress  Substance use - Stopped using cocaine less than a year ago - Resumed alcohol  consumption - Drank two beers after getting off work at 7 am. No other substances.   Sleep pattern and occupational factors - Works third shift as an Engineer, Site - Sleeps after his shift     Health Maintenance Due  Topic Date Due   Hepatitis C Screening  Never done   Pneumococcal Vaccine (1 of 2 - PCV) Never done   DTaP/Tdap/Td (6 - Td or Tdap) 04/09/2018   Influenza Vaccine  04/17/2024   COVID-19 Vaccine (1 - 2025-26 season) Never done    Past Medical History:  Diagnosis Date   Anxiety  and depression    Childhood asthma    Cocaine abuse (HCC)    Environmental allergies    GERD (gastroesophageal reflux disease)    Headache    migraines   Panic attacks    Polysubstance abuse (HCC)    Seasonal allergic conjunctivitis    Seizures (HCC)    caused by flexiril 01/2022    Past Surgical History:  Procedure Laterality Date   ESOPHAGOGASTRODUODENOSCOPY     Left Knee Menuiscus     several surguries on same knee   NM ESOPHAGEAL REFLUX     Right Shoulder Surgery     TONSILLECTOMY     adenoids   UPPER GI ENDOSCOPY      Family History  Problem Relation Age of Onset   Fibromyalgia Mother    Thyroid  disease Mother        Living   Lupus Mother    Anxiety disorder Mother    Clotting disorder Mother    Crohn's disease Mother    Hypertension Father    Hyperlipidemia Father        Living   Liver disease Father    Anxiety disorder Sister    ADD / ADHD Brother    Seizures Brother    Anxiety disorder Maternal Aunt    Depression Maternal Aunt    Lupus Maternal Aunt    Diabetes Paternal Aunt    Lupus Paternal Aunt    Fibromyalgia Paternal Aunt    Drug abuse  Paternal Uncle    Diabetes Maternal Grandmother    Hypertension Maternal Grandmother    Liver disease Maternal Grandmother    Bone cancer Paternal Grandmother    Heart disease Paternal Grandfather    Diabetes Paternal Grandfather    Migraines Other        Various   Colon cancer Neg Hx    Esophageal cancer Neg Hx    Rectal cancer Neg Hx    Stomach cancer Neg Hx     Social History   Socioeconomic History   Marital status: Single    Spouse name: Not on file   Number of children: 1   Years of education: 12   Highest education level: Some college, no degree  Occupational History   Occupation: Pension Scheme Manager  Tobacco Use   Smoking status: Every Day    Current packs/day: 0.50    Average packs/day: 0.5 packs/day for 2.0 years (1.0 ttl pk-yrs)    Types: Cigarettes   Smokeless  tobacco: Never   Tobacco comments:    1 pack every 2-3 days  Vaping Use   Vaping status: Never Used  Substance and Sexual Activity   Alcohol  use: Not Currently   Drug use: Yes    Types: Marijuana    Comment: Daily-   Sexual activity: Yes  Other Topics Concern   Not on file  Social History Narrative   Single-engaged, 1 child lives with fianc   Marijuana user Cigarette smoker   no current alcohol  no other drugs   He is an Designer, Television/film Set   Social Drivers of Health   Tobacco Use: High Risk (09/25/2024)   Patient History    Smoking Tobacco Use: Every Day    Smokeless Tobacco Use: Never    Passive Exposure: Not on file  Financial Resource Strain: High Risk (03/04/2024)   Overall Financial Resource Strain (CARDIA)    Difficulty of Paying Living Expenses: Very hard  Food Insecurity: Food Insecurity Present (03/04/2024)   Epic    Worried About Programme Researcher, Broadcasting/film/video in the Last Year: Often true    Ran Out of Food in the Last Year: Often true  Transportation Needs: Unmet Transportation Needs (03/04/2024)   Epic    Lack of Transportation (Medical): Yes    Lack of Transportation (Non-Medical): Yes  Physical Activity: Sufficiently Active (03/04/2024)   Exercise Vital Sign    Days of Exercise per Week: 5 days    Minutes of Exercise per Session: 30 min  Stress: Stress Concern Present (03/04/2024)   [employer] of Occupational Health - Occupational Stress Questionnaire    Feeling of Stress: Rather much  Social Connections: Socially Integrated (03/04/2024)   Social Connection and Isolation Panel    Frequency of Communication with Friends and Family: More than three times a week    Frequency of Social Gatherings with Friends and Family: More than three times a week    Attends Religious Services: More than 4 times per year    Active Member of [employer] or Organizations: Yes    Attends Banker Meetings: More than 4 times per year    Marital Status: Living with partner  Intimate  Partner Violence: Not At Risk (12/03/2022)   Humiliation, Afraid, Rape, and Kick questionnaire    Fear of Current or Ex-Partner: No    Emotionally Abused: No    Physically Abused: No    Sexually Abused: No  Depression (PHQ2-9): High Risk (09/25/2024)   Depression (PHQ2-9)  PHQ-2 Score: 18  Alcohol  Screen: Low Risk (03/04/2024)   Alcohol  Screen    Last Alcohol  Screening Score (AUDIT): 1  Recent Concern: Alcohol  Screen - High Risk (01/23/2024)   Alcohol  Screen    Last Alcohol  Screening Score (AUDIT): 31  Housing: High Risk (03/04/2024)   Epic    Unable to Pay for Housing in the Last Year: Yes    Number of Times Moved in the Last Year: 2    Homeless in the Last Year: No  Utilities: Not At Risk (12/03/2022)   AHC Utilities    Threatened with loss of utilities: No  Health Literacy: Not on file    Outpatient Medications Prior to Visit  Medication Sig Dispense Refill   omeprazole  (PRILOSEC) 40 MG capsule Take 1 capsule (40 mg total) by mouth 2 (two) times daily before a meal. 30 mins before breakfast and supper 180 capsule 3   buPROPion  ER (WELLBUTRIN  SR) 100 MG 12 hr tablet Take 1 tablet (100 mg total) by mouth 2 (two) times daily. (Patient not taking: Reported on 09/25/2024) 60 tablet 2   famotidine  (PEPCID ) 40 MG tablet Take 1 tablet (40 mg total) by mouth at bedtime. (Patient not taking: Reported on 09/25/2024) 90 tablet 3   ondansetron  (ZOFRAN ) 8 MG tablet Take 1 tablet (8 mg total) by mouth every 8 (eight) hours as needed for nausea or vomiting. (Patient not taking: Reported on 09/25/2024) 30 tablet 2   sildenafil  (VIAGRA ) 50 MG tablet Take 1 tablet (50 mg total) by mouth daily as needed for erectile dysfunction. (Patient not taking: Reported on 09/25/2024) 10 tablet 0   traZODone  (DESYREL ) 50 MG tablet Take 1 tablet (50 mg total) by mouth at bedtime as needed and may repeat dose one time if needed for sleep. (Patient not taking: Reported on 09/25/2024) 60 tablet 2   valsartan  (DIOVAN ) 80 MG tablet  Take 1 tablet (80 mg total) by mouth daily. (Patient not taking: Reported on 09/25/2024) 90 tablet 0   varenicline  (CHANTIX ) 1 MG tablet Take 1 tablet (1 mg total) by mouth 2 (two) times daily. (Patient not taking: Reported on 09/25/2024) 60 tablet 1   No facility-administered medications prior to visit.    Allergies[1]  Review of Systems  Constitutional:  Negative for chills and fever.  HENT:  Negative for congestion and sinus pain.   Eyes:  Negative for blurred vision and double vision.  Respiratory:  Negative for cough, shortness of breath and wheezing.   Cardiovascular:  Negative for chest pain, palpitations and leg swelling.  Gastrointestinal:  Negative for abdominal pain, constipation, diarrhea, nausea and vomiting.  Musculoskeletal:  Negative for back pain, joint pain and myalgias.  Neurological:  Negative for dizziness, tingling, focal weakness, seizures, loss of consciousness and headaches.  Psychiatric/Behavioral:  Positive for depression. Negative for memory loss and suicidal ideas. The patient is nervous/anxious.        SI without intent of self harm        Objective:    Physical Exam Constitutional:      General: He is not in acute distress.    Appearance: He is not ill-appearing.  HENT:     Mouth/Throat:     Mouth: Mucous membranes are moist.     Pharynx: Oropharynx is clear.  Eyes:     Extraocular Movements: Extraocular movements intact.     Conjunctiva/sclera: Conjunctivae normal.     Pupils: Pupils are equal, round, and reactive to light.  Cardiovascular:     Rate  and Rhythm: Normal rate and regular rhythm.  Pulmonary:     Effort: Pulmonary effort is normal.     Breath sounds: Normal breath sounds.  Musculoskeletal:        General: Normal range of motion.     Cervical back: Normal range of motion and neck supple. No tenderness.     Right lower leg: No edema.     Left lower leg: No edema.  Lymphadenopathy:     Cervical: No cervical adenopathy.  Skin:     General: Skin is warm and dry.  Neurological:     General: No focal deficit present.     Mental Status: He is alert and oriented to person, place, and time.     Cranial Nerves: No cranial nerve deficit.     Motor: No weakness.     Coordination: Coordination normal.     Gait: Gait normal.  Psychiatric:        Attention and Perception: Attention normal.        Mood and Affect: Mood normal.        Speech: Speech normal.        Behavior: Behavior normal.        Thought Content: Thought content is not paranoid. Thought content includes suicidal ideation. Thought content does not include homicidal ideation. Thought content does not include suicidal plan.      BP (!) 136/92   Pulse (!) 110   Temp 97.6 F (36.4 C) (Temporal)   Ht 5' 11 (1.803 m)   Wt 248 lb (112.5 kg)   SpO2 97%   BMI 34.59 kg/m  Wt Readings from Last 3 Encounters:  09/25/24 248 lb (112.5 kg)  03/05/24 238 lb (108 kg)  01/23/24 243 lb (110.2 kg)       Assessment & Plan:   Problem List Items Addressed This Visit     Seizure disorder (HCC)   Other Visit Diagnoses       Suicidal thoughts    -  Primary     Snores         Nightmares         Severe episode of recurrent major depressive disorder, without psychotic features (HCC)           Assessment and Plan Assessment & Plan Major depressive disorder, recurrent, severe with suicidal ideation Recurrent severe major depressive disorder with active suicidal ideation. Reports suicidal thoughts, including dreams of jumping off objects and thoughts of self-harm. Recently resumed alcohol  use, exacerbating depressive symptoms. No current access to firearms. Expresses feelings of anger and lack of support from partner. Acknowledges need for help but has not reached out to counselor or psychiatrist. - Referred to urgent care for immediate evaluation and intervention. - Documented verbal contract for safety. - Advised to seek help from behavioral health  services.  Alcohol  use disorder Recent relapse. Reports drinking alcohol  to cope with anxiety and depression. Last drank two beers when he got off work at 7 am.  Alcohol  use likely exacerbating sleep apnea and depressive symptoms. Denies use of other substances.  - Does not appear intoxicated. Asked him if he would like for me to call someone to drive him to [facility] and he declines. He feels safe to drive himself.  - Advised to seek help from behavioral health services. - Discussed risks of alcohol  use on mental health and sleep apnea.  Obstructive sleep apnea suspected Suspected obstructive sleep apnea based on symptoms of loud snoring and witnessed apnea  during sleep. Alcohol  use likely exacerbating symptoms. No prior sleep study conducted. - Referred to sleep specialist at [facility] for evaluation and management. - Advised to achieve sobriety before further sleep study evaluation.  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 Likely exacerbated by alcohol  use and underlying depression. - Address underlying depression and alcohol  use disorder to manage nightmares.  Epilepsy Seizure disorder under the care of [facility]. No recent seizures reported since cessation of cocaine use. - Encouraged follow-up with neurologist at [facility] for ongoing management.  Secondary hypertension Elevated blood pressure likely due to alcohol  use and non-adherence to antihypertensive medication.     I am having Noa L. Mitro maintain his omeprazole , famotidine , ondansetron , buPROPion  ER, traZODone , valsartan , sildenafil , and varenicline .  No orders of the defined types were placed in this encounter.      [1]  Allergies Allergen Reactions   Penicillins Hives    Did it involve swelling of the face/tongue/throat, SOB, or low BP? Unknown Did it involve sudden or severe rash/hives,  skin peeling, or any reaction on the inside of your mouth or nose? Unknown Did you need to seek medical attention at a hospital or doctor's office? Unknown When did it last happen?   Pt doesn't remember he said he was a child    If all above answers are NO, may proceed with cephalosporin use.    Flexeril  [Cyclobenzaprine ]     seizures   "
